# Patient Record
Sex: Female | Born: 1970
Health system: Southern US, Community
[De-identification: ages and names within clinical notes are randomized; demographics above are authoritative.]

## PROBLEM LIST (undated history)

## (undated) DIAGNOSIS — F32A Depression, unspecified: Secondary | ICD-10-CM

## (undated) DIAGNOSIS — R000 Tachycardia, unspecified: Secondary | ICD-10-CM

## (undated) DIAGNOSIS — K219 Gastro-esophageal reflux disease without esophagitis: Secondary | ICD-10-CM

## (undated) DIAGNOSIS — Z8742 Personal history of other diseases of the female genital tract: Secondary | ICD-10-CM

## (undated) DIAGNOSIS — I341 Nonrheumatic mitral (valve) prolapse: Secondary | ICD-10-CM

## (undated) DIAGNOSIS — M1990 Unspecified osteoarthritis, unspecified site: Secondary | ICD-10-CM

## (undated) DIAGNOSIS — F419 Anxiety disorder, unspecified: Secondary | ICD-10-CM

## (undated) DIAGNOSIS — F3281 Premenstrual dysphoric disorder: Secondary | ICD-10-CM

## (undated) DIAGNOSIS — E785 Hyperlipidemia, unspecified: Secondary | ICD-10-CM

## (undated) DIAGNOSIS — R002 Palpitations: Secondary | ICD-10-CM

## (undated) DIAGNOSIS — N12 Tubulo-interstitial nephritis, not specified as acute or chronic: Secondary | ICD-10-CM

## (undated) DIAGNOSIS — F329 Major depressive disorder, single episode, unspecified: Secondary | ICD-10-CM

## (undated) DIAGNOSIS — M51369 Other intervertebral disc degeneration, lumbar region without mention of lumbar back pain or lower extremity pain: Secondary | ICD-10-CM

## (undated) DIAGNOSIS — T7840XA Allergy, unspecified, initial encounter: Secondary | ICD-10-CM

## (undated) DIAGNOSIS — I10 Essential (primary) hypertension: Secondary | ICD-10-CM

## (undated) DIAGNOSIS — E559 Vitamin D deficiency, unspecified: Secondary | ICD-10-CM

## (undated) DIAGNOSIS — N390 Urinary tract infection, site not specified: Secondary | ICD-10-CM

## (undated) DIAGNOSIS — Q433 Congenital malformations of intestinal fixation: Secondary | ICD-10-CM

## (undated) HISTORY — DX: Allergy, unspecified, initial encounter: T78.40XA

## (undated) HISTORY — PX: FOOT SURGERY: SHX648

## (undated) HISTORY — PX: COLPOSCOPY: SHX161

## (undated) HISTORY — DX: Personal history of other diseases of the female genital tract: Z87.42

## (undated) HISTORY — DX: Tubulo-interstitial nephritis, not specified as acute or chronic: N12

## (undated) HISTORY — DX: Hyperlipidemia, unspecified: E78.5

## (undated) HISTORY — PX: HEMORRHOID SURGERY: SHX153

## (undated) HISTORY — DX: Unspecified osteoarthritis, unspecified site: M19.90

## (undated) HISTORY — DX: Major depressive disorder, single episode, unspecified: F32.9

## (undated) HISTORY — DX: Urinary tract infection, site not specified: N39.0

## (undated) HISTORY — DX: Tachycardia, unspecified: R00.0

## (undated) HISTORY — PX: NASAL SINUS SURGERY: SHX719

## (undated) HISTORY — DX: Depression, unspecified: F32.A

## (undated) HISTORY — DX: Anxiety disorder, unspecified: F41.9

## (undated) HISTORY — PX: COLON SURGERY: SHX602

## (undated) HISTORY — DX: Congenital malformations of intestinal fixation: Q43.3

## (undated) HISTORY — PX: TUBAL LIGATION: SHX77

## (undated) HISTORY — DX: Nonrheumatic mitral (valve) prolapse: I34.1

## (undated) HISTORY — DX: Palpitations: R00.2

## (undated) HISTORY — DX: Gastro-esophageal reflux disease without esophagitis: K21.9

## (undated) HISTORY — DX: Essential (primary) hypertension: I10

---

## 1997-09-21 HISTORY — PX: INTESTINAL MALROTATION REPAIR: SHX411

## 1997-09-21 HISTORY — PX: APPENDECTOMY: SHX54

## 2000-09-21 HISTORY — PX: EXCISION MORTON'S NEUROMA: SHX5013

## 2008-09-18 ENCOUNTER — Inpatient Hospital Stay (HOSPITAL_COMMUNITY): Admission: AD | Admit: 2008-09-18 | Discharge: 2008-09-18 | Payer: Self-pay | Admitting: Obstetrics & Gynecology

## 2008-12-26 ENCOUNTER — Ambulatory Visit: Payer: Self-pay | Admitting: Obstetrics & Gynecology

## 2008-12-31 ENCOUNTER — Ambulatory Visit (HOSPITAL_COMMUNITY): Admission: RE | Admit: 2008-12-31 | Discharge: 2008-12-31 | Payer: Self-pay | Admitting: Obstetrics & Gynecology

## 2009-01-09 ENCOUNTER — Encounter: Payer: Self-pay | Admitting: Obstetrics and Gynecology

## 2009-01-09 ENCOUNTER — Ambulatory Visit: Payer: Self-pay | Admitting: Obstetrics & Gynecology

## 2009-01-16 ENCOUNTER — Ambulatory Visit: Payer: Self-pay | Admitting: Obstetrics & Gynecology

## 2009-01-23 ENCOUNTER — Encounter: Payer: Self-pay | Admitting: Obstetrics and Gynecology

## 2009-01-23 ENCOUNTER — Ambulatory Visit: Payer: Self-pay | Admitting: Obstetrics & Gynecology

## 2009-01-23 LAB — CONVERTED CEMR LAB
Chlamydia, DNA Probe: NEGATIVE
GC Probe Amp, Genital: NEGATIVE

## 2009-01-24 ENCOUNTER — Encounter: Payer: Self-pay | Admitting: Obstetrics and Gynecology

## 2009-01-30 ENCOUNTER — Encounter: Payer: Self-pay | Admitting: Physician Assistant

## 2009-01-30 ENCOUNTER — Ambulatory Visit: Payer: Self-pay | Admitting: Obstetrics & Gynecology

## 2009-02-13 ENCOUNTER — Ambulatory Visit: Payer: Self-pay | Admitting: Obstetrics & Gynecology

## 2009-02-18 ENCOUNTER — Inpatient Hospital Stay (HOSPITAL_COMMUNITY): Admission: AD | Admit: 2009-02-18 | Discharge: 2009-02-20 | Payer: Self-pay | Admitting: Obstetrics & Gynecology

## 2009-02-18 ENCOUNTER — Ambulatory Visit: Payer: Self-pay | Admitting: Obstetrics and Gynecology

## 2009-02-24 ENCOUNTER — Inpatient Hospital Stay (HOSPITAL_COMMUNITY): Admission: AD | Admit: 2009-02-24 | Discharge: 2009-02-24 | Payer: Self-pay | Admitting: Obstetrics & Gynecology

## 2009-03-29 ENCOUNTER — Inpatient Hospital Stay (HOSPITAL_COMMUNITY): Admission: AD | Admit: 2009-03-29 | Discharge: 2009-03-29 | Payer: Self-pay | Admitting: Obstetrics & Gynecology

## 2009-03-29 ENCOUNTER — Ambulatory Visit: Payer: Self-pay | Admitting: Obstetrics and Gynecology

## 2009-04-25 ENCOUNTER — Ambulatory Visit: Payer: Self-pay | Admitting: Obstetrics and Gynecology

## 2009-04-26 ENCOUNTER — Encounter: Payer: Self-pay | Admitting: Obstetrics & Gynecology

## 2009-05-02 ENCOUNTER — Ambulatory Visit: Payer: Self-pay | Admitting: Obstetrics & Gynecology

## 2009-05-02 ENCOUNTER — Encounter: Payer: Self-pay | Admitting: Obstetrics & Gynecology

## 2009-12-19 ENCOUNTER — Ambulatory Visit (HOSPITAL_BASED_OUTPATIENT_CLINIC_OR_DEPARTMENT_OTHER): Admission: RE | Admit: 2009-12-19 | Discharge: 2009-12-19 | Payer: Self-pay | Admitting: Surgery

## 2010-05-23 ENCOUNTER — Emergency Department (HOSPITAL_COMMUNITY): Admission: EM | Admit: 2010-05-23 | Discharge: 2010-05-23 | Payer: Self-pay | Admitting: Emergency Medicine

## 2010-06-06 ENCOUNTER — Encounter: Admission: RE | Admit: 2010-06-06 | Discharge: 2010-06-06 | Payer: Self-pay | Admitting: Gastroenterology

## 2010-07-28 ENCOUNTER — Emergency Department (HOSPITAL_COMMUNITY): Admission: EM | Admit: 2010-07-28 | Discharge: 2010-07-28 | Payer: Self-pay | Admitting: Family Medicine

## 2010-09-09 ENCOUNTER — Emergency Department (HOSPITAL_COMMUNITY)
Admission: EM | Admit: 2010-09-09 | Discharge: 2010-09-09 | Payer: Self-pay | Source: Home / Self Care | Admitting: Emergency Medicine

## 2010-12-01 LAB — GC/CHLAMYDIA PROBE AMP, GENITAL
Chlamydia, DNA Probe: NEGATIVE
GC Probe Amp, Genital: NEGATIVE

## 2010-12-01 LAB — POCT URINALYSIS DIPSTICK
Bilirubin Urine: NEGATIVE
Glucose, UA: NEGATIVE mg/dL
Ketones, ur: NEGATIVE mg/dL
Nitrite: NEGATIVE
Protein, ur: NEGATIVE mg/dL
Specific Gravity, Urine: 1.02 (ref 1.005–1.030)
Urobilinogen, UA: 0.2 mg/dL (ref 0.0–1.0)
pH: 7.5 (ref 5.0–8.0)

## 2010-12-01 LAB — POCT PREGNANCY, URINE: Preg Test, Ur: NEGATIVE

## 2010-12-01 LAB — WET PREP, GENITAL
Clue Cells Wet Prep HPF POC: NONE SEEN
Trich, Wet Prep: NONE SEEN
Yeast Wet Prep HPF POC: NONE SEEN

## 2010-12-04 LAB — POCT URINALYSIS DIPSTICK
Bilirubin Urine: NEGATIVE
Glucose, UA: NEGATIVE mg/dL
Nitrite: NEGATIVE
Protein, ur: NEGATIVE mg/dL
Specific Gravity, Urine: 1.025 (ref 1.005–1.030)
Urobilinogen, UA: 0.2 mg/dL (ref 0.0–1.0)
pH: 6 (ref 5.0–8.0)

## 2010-12-04 LAB — POCT PREGNANCY, URINE: Preg Test, Ur: NEGATIVE

## 2010-12-15 LAB — POCT HEMOGLOBIN-HEMACUE: Hemoglobin: 15.1 g/dL — ABNORMAL HIGH (ref 12.0–15.0)

## 2010-12-28 LAB — URINALYSIS, ROUTINE W REFLEX MICROSCOPIC
Bilirubin Urine: NEGATIVE
Glucose, UA: NEGATIVE mg/dL
Ketones, ur: NEGATIVE mg/dL
Nitrite: NEGATIVE
Protein, ur: NEGATIVE mg/dL
Specific Gravity, Urine: 1.025 (ref 1.005–1.030)
Urobilinogen, UA: 0.2 mg/dL (ref 0.0–1.0)
pH: 5.5 (ref 5.0–8.0)

## 2010-12-28 LAB — URINE MICROSCOPIC-ADD ON

## 2010-12-29 LAB — CBC
HCT: 27.6 % — ABNORMAL LOW (ref 36.0–46.0)
Hemoglobin: 9.5 g/dL — ABNORMAL LOW (ref 12.0–15.0)
MCHC: 34.4 g/dL (ref 30.0–36.0)
MCV: 92 fL (ref 78.0–100.0)
Platelets: 197 10*3/uL (ref 150–400)
RBC: 3 MIL/uL — ABNORMAL LOW (ref 3.87–5.11)
RDW: 14.5 % (ref 11.5–15.5)
WBC: 20.8 10*3/uL — ABNORMAL HIGH (ref 4.0–10.5)

## 2010-12-30 LAB — POCT URINALYSIS DIP (DEVICE)
Bilirubin Urine: NEGATIVE
Glucose, UA: NEGATIVE mg/dL
Glucose, UA: NEGATIVE mg/dL
Glucose, UA: NEGATIVE mg/dL
Ketones, ur: NEGATIVE mg/dL
Ketones, ur: NEGATIVE mg/dL
Nitrite: NEGATIVE
Nitrite: NEGATIVE
Nitrite: NEGATIVE
Protein, ur: 100 mg/dL — AB
Protein, ur: 30 mg/dL — AB
Protein, ur: 30 mg/dL — AB
Specific Gravity, Urine: 1.025 (ref 1.005–1.030)
Specific Gravity, Urine: 1.02 (ref 1.005–1.030)
Specific Gravity, Urine: 1.02 (ref 1.005–1.030)
Urobilinogen, UA: 0.2 mg/dL (ref 0.0–1.0)
Urobilinogen, UA: 0.2 mg/dL (ref 0.0–1.0)
Urobilinogen, UA: 0.2 mg/dL (ref 0.0–1.0)
pH: 7 (ref 5.0–8.0)
pH: 7 (ref 5.0–8.0)
pH: 8 (ref 5.0–8.0)

## 2010-12-30 LAB — CBC
HCT: 36.7 % (ref 36.0–46.0)
Hemoglobin: 12.7 g/dL (ref 12.0–15.0)
MCHC: 34.7 g/dL (ref 30.0–36.0)
MCV: 91.2 fL (ref 78.0–100.0)
Platelets: 237 10*3/uL (ref 150–400)
RBC: 4.02 MIL/uL (ref 3.87–5.11)
RDW: 14.4 % (ref 11.5–15.5)
WBC: 12.7 10*3/uL — ABNORMAL HIGH (ref 4.0–10.5)

## 2010-12-30 LAB — RPR: RPR Ser Ql: NONREACTIVE

## 2010-12-31 LAB — POCT URINALYSIS DIP (DEVICE)
Bilirubin Urine: NEGATIVE
Bilirubin Urine: NEGATIVE
Glucose, UA: NEGATIVE mg/dL
Glucose, UA: NEGATIVE mg/dL
Glucose, UA: NEGATIVE mg/dL
Ketones, ur: 80 mg/dL — AB
Ketones, ur: NEGATIVE mg/dL
Ketones, ur: NEGATIVE mg/dL
Nitrite: NEGATIVE
Nitrite: POSITIVE — AB
Nitrite: NEGATIVE
Protein, ur: 100 mg/dL — AB
Protein, ur: NEGATIVE mg/dL
Protein, ur: 30 mg/dL — AB
Specific Gravity, Urine: 1.02 (ref 1.005–1.030)
Specific Gravity, Urine: 1.025 (ref 1.005–1.030)
Specific Gravity, Urine: 1.025 (ref 1.005–1.030)
Urobilinogen, UA: 0.2 mg/dL (ref 0.0–1.0)
Urobilinogen, UA: 0.2 mg/dL (ref 0.0–1.0)
Urobilinogen, UA: 0.2 mg/dL (ref 0.0–1.0)
pH: 6 (ref 5.0–8.0)
pH: 6.5 (ref 5.0–8.0)
pH: 6.5 (ref 5.0–8.0)

## 2011-01-09 ENCOUNTER — Inpatient Hospital Stay (INDEPENDENT_AMBULATORY_CARE_PROVIDER_SITE_OTHER)
Admission: RE | Admit: 2011-01-09 | Discharge: 2011-01-09 | Disposition: A | Discharge status: Home / Self Care | Payer: Medicaid Other | Source: Ambulatory Visit | Attending: Family Medicine | Admitting: Family Medicine

## 2011-01-09 DIAGNOSIS — J019 Acute sinusitis, unspecified: Secondary | ICD-10-CM

## 2011-06-26 LAB — URINE MICROSCOPIC-ADD ON

## 2011-06-26 LAB — GC/CHLAMYDIA PROBE AMP, GENITAL
Chlamydia, DNA Probe: NEGATIVE
GC Probe Amp, Genital: NEGATIVE

## 2011-06-26 LAB — WET PREP, GENITAL
Clue Cells Wet Prep HPF POC: NONE SEEN
Trich, Wet Prep: NONE SEEN
Yeast Wet Prep HPF POC: NONE SEEN

## 2011-06-26 LAB — CBC
HCT: 33.4 % — ABNORMAL LOW (ref 36.0–46.0)
Hemoglobin: 11.6 g/dL — ABNORMAL LOW (ref 12.0–15.0)
MCHC: 34.8 g/dL (ref 30.0–36.0)
MCV: 92.8 fL (ref 78.0–100.0)
Platelets: 197 10*3/uL (ref 150–400)
RBC: 3.6 MIL/uL — ABNORMAL LOW (ref 3.87–5.11)
RDW: 13.3 % (ref 11.5–15.5)
WBC: 11.3 10*3/uL — ABNORMAL HIGH (ref 4.0–10.5)

## 2011-06-26 LAB — URINALYSIS, ROUTINE W REFLEX MICROSCOPIC
Bilirubin Urine: NEGATIVE
Glucose, UA: NEGATIVE mg/dL
Ketones, ur: 15 mg/dL — AB
Leukocytes, UA: NEGATIVE
Nitrite: NEGATIVE
Protein, ur: NEGATIVE mg/dL
Specific Gravity, Urine: >1.03 — ABNORMAL HIGH (ref 1.005–1.030)
Urobilinogen, UA: 0.2 mg/dL (ref 0.0–1.0)
pH: 6 (ref 5.0–8.0)

## 2012-01-22 ENCOUNTER — Encounter (HOSPITAL_COMMUNITY): Payer: Self-pay | Admitting: *Deleted

## 2012-01-22 ENCOUNTER — Emergency Department (HOSPITAL_COMMUNITY)
Admission: EM | Admit: 2012-01-22 | Discharge: 2012-01-22 | Disposition: A | Discharge status: Home / Self Care | Payer: Self-pay | Source: Home / Self Care | Attending: Emergency Medicine | Admitting: Emergency Medicine

## 2012-01-22 DIAGNOSIS — J029 Acute pharyngitis, unspecified: Secondary | ICD-10-CM

## 2012-01-22 LAB — POCT RAPID STREP A: Streptococcus, Group A Screen (Direct): NEGATIVE

## 2012-01-22 MED ORDER — TRAMADOL HCL 50 MG PO TABS: 100.0000 mg | ORAL_TABLET | Freq: Three times a day (TID) | ORAL | Status: AC | PRN

## 2012-01-22 MED ORDER — NAPROXEN 500 MG PO TABS: 500.0000 mg | ORAL_TABLET | Freq: Two times a day (BID) | ORAL | Status: AC

## 2012-01-22 MED ORDER — BENZONATATE 200 MG PO CAPS: 200.0000 mg | ORAL_CAPSULE | Freq: Three times a day (TID) | ORAL | Status: AC | PRN

## 2012-01-22 MED ORDER — PREDNISONE 5 MG PO KIT: 1.0000 | PACK | Freq: Every day | ORAL | Status: DC

## 2012-01-22 NOTE — ED Provider Notes (Signed)
Chief Complaint  Patient presents with  . Sore Throat    History of Present Illness:   The patient is a 41 year old female who has had a 3 or four-day history of severe sore throat, pain on swallowing, pain in her neck, headache, nasal congestion with green rhinorrhea, cough productive of green sputum, and loose stools. She's been exposed to her son who has had the same symptoms. She denies any exposure to strep. She's had no fever, chills, nausea, vomiting, wheezing, or swollen glands.  Review of Systems:  Other than noted above, the patient denies any of the following symptoms. Systemic:  No fever, chills, sweats, fatigue, myalgias, headache, or anorexia. Eye:  No redness, pain or drainage. ENT:  No earache, ear congestion, nasal congestion, sneezing, rhinorrhea, sinus pressure, sinus pain, post nasal drip, or sore throat. Lungs:  No cough, sputum production, wheezing, shortness of breath, or chest pain. GI:  No abdominal pain, nausea, vomiting, or diarrhea. Skin:  No rash or itching.  PMFSH:  Past medical history, family history, social history, meds, and allergies were reviewed.  Physical Exam:   Vital signs:  BP 142/96  Pulse 102  Temp(Src) 99.5 F (37.5 C) (Oral)  Resp 18  SpO2 96%  LMP 01/11/2012 General:  Alert, in no distress. Eye:  No conjunctival injection or drainage. Lids were normal. ENT:  TMs and canals were normal, without erythema or inflammation.  Nasal mucosa was clear and uncongested, without drainage.  Mucous membranes were moist.  Pharynx was mildly erythematous without exudate or drainage.  There were no oral ulcerations or lesions. Neck:  Supple, no adenopathy, tenderness or mass. Lungs:  No respiratory distress.  Lungs were clear to auscultation, without wheezes, rales or rhonchi.  Breath sounds were clear and equal bilaterally. Lungs were resonant to percussion.  No egophony. Heart:  Regular rhythm, without gallops, murmers or rubs. Skin:  Clear, warm, and  dry, without rash or lesions.  Labs:   Results for orders placed during the hospital encounter of 01/22/12  POCT RAPID STREP A ([facility] ONLY)      Component Value Range   Streptococcus, Group A Screen (Direct) NEGATIVE  NEGATIVE     Radiology:  No results found.  Assessment:  The encounter diagnosis was Viral pharyngitis.  Plan:   1.  The following meds were prescribed:   New Prescriptions   BENZONATATE (TESSALON) 200 MG CAPSULE    Take 1 capsule (200 mg total) by mouth 3 (three) times daily as needed for cough.   NAPROXEN (NAPROSYN) 500 MG TABLET    Take 1 tablet (500 mg total) by mouth 2 (two) times daily.   PREDNISONE 5 MG KIT    Take 1 kit (5 mg total) by mouth daily after breakfast. Prednisone 5 mg 6 day dosepack.  Take as directed.   TRAMADOL (ULTRAM) 50 MG TABLET    Take 2 tablets (100 mg total) by mouth every 8 (eight) hours as needed for pain.   2.  The patient was instructed in symptomatic care and handouts were given. 3.  The patient was told to return if becoming worse in any way, if no better in 3 or 4 days, and given some red flag symptoms that would indicate earlier return.   Reuben Likes, MD 01/22/12 [phone]

## 2012-01-22 NOTE — ED Notes (Signed)
Pt  Has  Reports  Symptoms   Of  Fever  sorethroat  abd  Pains    As  Well  As      Body  Aches    X   3  Days  She  Reports  She  Has  Had  A  Low grade  Fever     -  She is  Masked  And  Is  In a  Private  Room        She  Is  Sitting  Upright on  Exam table speaking in  Complete  sentances

## 2012-01-22 NOTE — Discharge Instructions (Signed)

## 2012-11-28 ENCOUNTER — Ambulatory Visit: Payer: Medicaid Other | Attending: Sports Medicine | Admitting: Physical Therapy

## 2012-11-28 DIAGNOSIS — R5381 Other malaise: Secondary | ICD-10-CM | POA: Insufficient documentation

## 2012-11-28 DIAGNOSIS — M545 Low back pain, unspecified: Secondary | ICD-10-CM | POA: Insufficient documentation

## 2012-11-28 DIAGNOSIS — IMO0001 Reserved for inherently not codable concepts without codable children: Secondary | ICD-10-CM | POA: Insufficient documentation

## 2012-11-28 DIAGNOSIS — M2560 Stiffness of unspecified joint, not elsewhere classified: Secondary | ICD-10-CM | POA: Insufficient documentation

## 2012-12-05 ENCOUNTER — Ambulatory Visit: Payer: Medicaid Other | Admitting: Rehabilitation

## 2012-12-12 ENCOUNTER — Ambulatory Visit: Payer: Medicaid Other | Admitting: Rehabilitation

## 2012-12-19 ENCOUNTER — Ambulatory Visit: Payer: Medicaid Other | Admitting: Rehabilitation

## 2012-12-26 ENCOUNTER — Ambulatory Visit: Payer: Medicaid Other | Attending: Sports Medicine | Admitting: Rehabilitation

## 2012-12-26 DIAGNOSIS — IMO0001 Reserved for inherently not codable concepts without codable children: Secondary | ICD-10-CM | POA: Insufficient documentation

## 2012-12-26 DIAGNOSIS — R5381 Other malaise: Secondary | ICD-10-CM | POA: Insufficient documentation

## 2012-12-26 DIAGNOSIS — M545 Low back pain, unspecified: Secondary | ICD-10-CM | POA: Insufficient documentation

## 2012-12-26 DIAGNOSIS — M2560 Stiffness of unspecified joint, not elsewhere classified: Secondary | ICD-10-CM | POA: Insufficient documentation

## 2013-05-22 DIAGNOSIS — I10 Essential (primary) hypertension: Secondary | ICD-10-CM

## 2013-05-22 HISTORY — DX: Essential (primary) hypertension: I10

## 2013-05-31 ENCOUNTER — Encounter (HOSPITAL_COMMUNITY): Payer: Self-pay | Admitting: Emergency Medicine

## 2013-05-31 ENCOUNTER — Emergency Department (HOSPITAL_COMMUNITY)
Admission: EM | Admit: 2013-05-31 | Discharge: 2013-05-31 | Disposition: A | Discharge status: Home / Self Care | Payer: Medicaid Other | Source: Home / Self Care

## 2013-05-31 DIAGNOSIS — J309 Allergic rhinitis, unspecified: Secondary | ICD-10-CM

## 2013-05-31 MED ORDER — PREDNISONE 20 MG PO TABS: ORAL_TABLET | ORAL | Status: DC

## 2013-05-31 MED ORDER — TRIAMCINOLONE ACETONIDE 40 MG/ML IJ SUSP
INTRAMUSCULAR | Status: AC
  Filled 2013-05-31: qty 1

## 2013-05-31 MED ORDER — TRIAMCINOLONE ACETONIDE 40 MG/ML IJ SUSP
40.0000 mg | Freq: Once | INTRAMUSCULAR | Status: AC
  Administered 2013-05-31: 40 mg via INTRAMUSCULAR

## 2013-05-31 NOTE — ED Provider Notes (Signed)
CSN: [id number]     Arrival date & time 05/31/13  1701 History   First MD Initiated Contact with Patient 05/31/13 1826     Chief Complaint  Patient presents with  . URI   (Consider location/radiation/quality/duration/timing/severity/associated sxs/prior Treatment) HPI Comments: 42 year old female complaining of cough, runny nose, sneezing, sinus congestion this started September 1. She states that a lot of the runny nose, cough and PND have ameliorated however now she is complaining of nasal stuffiness and inability to breathe through the nose. She feels just stopped up. Complaining of pain in the maxillary sinuses, movement of the eyes and headache. Denies fever, chills or GI symptoms. She has a history of allergies for several years and it was made worse after moving to [place] 4 years ago.   History reviewed. No pertinent past medical history. Past Surgical History  Procedure Laterality Date  . Intestinal malrotation repair    . Foot surgery     History reviewed. No pertinent family history. History  Substance Use Topics  . Smoking status: Current Every Day Smoker  . Smokeless tobacco: Not on file  . Alcohol Use: Yes   OB History   Grav Para Term Preterm Abortions TAB SAB Ect Mult Living                 Review of Systems  Constitutional: Negative for fever, chills and activity change.  HENT: Positive for congestion, rhinorrhea and sinus pressure. Negative for hearing loss, ear pain, sore throat, neck pain, neck stiffness and ear discharge.   Respiratory: Negative for cough, shortness of breath and wheezing.   Cardiovascular: Negative.   Gastrointestinal: Negative.   Genitourinary: Negative.   Skin: Negative.   Neurological: Negative.     Allergies  Imitrex; Penicillins; Prozac; Sulfa antibiotics; and Zoloft  Home Medications   Current Outpatient Rx  Name  Route  Sig  Dispense  Refill  . predniSONE (DELTASONE) 20 MG tablet      3 tabs q d for 2 d, 2 tabs q d  for 4 d, 1 tab q d for 4 days. Take with food   18 tablet   0   . PredniSONE 5 MG KIT   Oral   Take 1 kit (5 mg total) by mouth daily after breakfast. Prednisone 5 mg 6 day dosepack.  Take as directed.   1 kit   0    BP 121/84  Pulse 76  Temp(Src) 97.7 F (36.5 C) (Oral)  Resp 16  SpO2 99%  LMP 05/11/2013 Physical Exam  Nursing note and vitals reviewed. Constitutional: She is oriented to person, place, and time. She appears well-developed and well-nourished. No distress.  HENT:  Right Ear: External ear normal.  Left Ear: External ear normal.  Mouth/Throat: Oropharynx is clear and moist. No oropharyngeal exudate.  Bilateral TMs are normal  Eyes: Conjunctivae and EOM are normal.  Neck: Normal range of motion. Neck supple.  Cardiovascular: Normal rate, regular rhythm and normal heart sounds.   Pulmonary/Chest: Effort normal and breath sounds normal. No respiratory distress. She has no wheezes. She has no rales.  Musculoskeletal: Normal range of motion. She exhibits no edema.  Lymphadenopathy:    She has no cervical adenopathy.  Neurological: She is alert and oriented to person, place, and time.  Skin: Skin is warm and dry. No rash noted.  Psychiatric: She has a normal mood and affect.    ED Course  Procedures (including critical care time) Labs Review Labs Reviewed - No  data to display Imaging Review No results found.  MDM   1. Allergic rhinitis   2. Allergic sinusitis      Kenalog 40 mg IM Starting tomorrow prednisone as directed Use Sudafed PE 10 mg every 4 hours when necessary congestion and Continue using the Netty pot and or nasal saline copiously frequently Continue the Zyrtec but may switch to Claritin or Allegra when necessary Guaifenesin as discussed Plenty of fluids    Hayden Rasmussen, NP 05/31/13 1918

## 2013-05-31 NOTE — ED Notes (Signed)
C/o cold symptoms for 11 days now.  OTC medication taken.  Patient states symptoms are dry cough, congested, headache, sweating, fever on and off and throat is hoarse.

## 2013-05-31 NOTE — ED Provider Notes (Signed)
Medical screening examination/treatment/procedure(s) were performed by resident physician or non-physician practitioner and as supervising physician I was immediately available for consultation/collaboration.   KINDL,JAMES DOUGLAS MD.   James D Kindl, MD 05/31/13 2023 

## 2013-06-04 ENCOUNTER — Telehealth (HOSPITAL_COMMUNITY): Payer: Self-pay | Admitting: Emergency Medicine

## 2013-06-04 NOTE — ED Notes (Signed)
Patient called stating she has followed on instructions but has not shown any improvement.  Chart reviewed by Hayden Rasmussen NP.  He advised patient to follow up with PCP.  Patient does not have a PCP.  Patient advised to try Allegra D or Claritin

## 2013-06-05 NOTE — ED Notes (Signed)
Chart review.

## 2013-06-09 ENCOUNTER — Ambulatory Visit: Payer: Medicaid Other | Attending: Family Medicine | Admitting: Internal Medicine

## 2013-06-09 VITALS — BP 167/93 | HR 90 | Temp 99.1°F | Resp 16 | Wt 168.0 lb

## 2013-06-09 DIAGNOSIS — Z0000 Encounter for general adult medical examination without abnormal findings: Secondary | ICD-10-CM | POA: Insufficient documentation

## 2013-06-09 DIAGNOSIS — R300 Dysuria: Secondary | ICD-10-CM | POA: Insufficient documentation

## 2013-06-09 DIAGNOSIS — J209 Acute bronchitis, unspecified: Secondary | ICD-10-CM | POA: Insufficient documentation

## 2013-06-09 LAB — CBC WITH DIFFERENTIAL/PLATELET
Basophils Absolute: 0 10*3/uL (ref 0.0–0.1)
Basophils Relative: 0 % (ref 0–1)
Eosinophils Absolute: 0.2 10*3/uL (ref 0.0–0.7)
Eosinophils Relative: 2 % (ref 0–5)
HCT: 43 % (ref 36.0–46.0)
Hemoglobin: 14.9 g/dL (ref 12.0–15.0)
Lymphocytes Relative: 19 % (ref 12–46)
Lymphs Abs: 2.3 10*3/uL (ref 0.7–4.0)
MCH: 29.4 pg (ref 26.0–34.0)
MCHC: 34.7 g/dL (ref 30.0–36.0)
MCV: 85 fL (ref 78.0–100.0)
Monocytes Absolute: 0.9 10*3/uL (ref 0.1–1.0)
Monocytes Relative: 7 % (ref 3–12)
Neutro Abs: 8.4 10*3/uL — ABNORMAL HIGH (ref 1.7–7.7)
Neutrophils Relative %: 72 % (ref 43–77)
Platelets: 367 10*3/uL (ref 150–400)
RBC: 5.06 MIL/uL (ref 3.87–5.11)
RDW: 14 % (ref 11.5–15.5)
WBC: 11.8 10*3/uL — ABNORMAL HIGH (ref 4.0–10.5)

## 2013-06-09 MED ORDER — BENZONATATE 100 MG PO CAPS: 100.0000 mg | ORAL_CAPSULE | Freq: Three times a day (TID) | ORAL | Status: DC

## 2013-06-09 MED ORDER — LEVOFLOXACIN 500 MG PO TABS: 500.0000 mg | ORAL_TABLET | Freq: Every day | ORAL | Status: DC

## 2013-06-09 MED ORDER — GUAIFENESIN-DM 100-10 MG/5ML PO SYRP: 5.0000 mL | ORAL_SOLUTION | Freq: Three times a day (TID) | ORAL | Status: DC | PRN

## 2013-06-09 NOTE — Patient Instructions (Signed)

## 2013-06-09 NOTE — Progress Notes (Signed)
Patient complains of not feeling well since labor day Cough Congestion Can not smell or taste anything

## 2013-06-09 NOTE — Progress Notes (Signed)
Patient ID: Victoria Barr, female   DOB: 16-Jan-1971, 42 y.o.   MRN: [id number] Patient Demographics  Victoria Barr, is a 42 y.o. female  WUJ:[id number]  [id number]  DOB - Feb 16, 1971  Chief Complaint  Patient presents with  . Cough        Subjective:   Victoria Barr today is here to establish primary care.  Patient is a 42 year old female with no significant past medical history, currently in school for medical assistant program, having significant stress having acute bronchitis, coughing with greenish phlegm, low-grade fever, has not felt good in the last 2 weeks. Also has some lower abdominal pain with pain with urination Patient has No headache, No chest pain, No abdominal pain - No Nausea, No new weakness tingling or numbness, No Cough - SOB.   Objective:    Filed Vitals:   06/09/13 0905  BP: 167/93  Pulse: 90  Temp: 99.1 F (37.3 C)  Resp: 16  Weight: 168 lb (76.204 kg)  SpO2: 100%     ALLERGIES:   Allergies  Allergen Reactions  . Imitrex [Sumatriptan]   . Penicillins   . Prozac [Fluoxetine Hcl]   . Sulfa Antibiotics   . Zoloft [Sertraline Hcl]     PAST MEDICAL HISTORY: History reviewed. No pertinent past medical history.  PAST SURGICAL HISTORY: Past Surgical History  Procedure Laterality Date  . Intestinal malrotation repair    . Foot surgery      FAMILY HISTORY: History reviewed. No pertinent family history.  MEDICATIONS AT HOME: Prior to Admission medications   Medication Sig Start Date End Date Taking? Authorizing Provider  benzonatate (TESSALON) 100 MG capsule Take 1 capsule (100 mg total) by mouth 3 (three) times daily. 06/09/13   Ripudeep Jenna Luo, MD  guaiFENesin-dextromethorphan (ROBITUSSIN DM) 100-10 MG/5ML syrup Take 5 mLs by mouth 3 (three) times daily as needed for cough. 06/09/13   Ripudeep Jenna Luo, MD  levofloxacin (LEVAQUIN) 500 MG tablet Take 1 tablet (500 mg total) by mouth daily. 06/09/13   Ripudeep Jenna Luo, MD  predniSONE  (DELTASONE) 20 MG tablet 3 tabs q d for 2 d, 2 tabs q d for 4 d, 1 tab q d for 4 days. Take with food 05/31/13   Hayden Rasmussen, NP  PredniSONE 5 MG KIT Take 1 kit (5 mg total) by mouth daily after breakfast. Prednisone 5 mg 6 day dosepack.  Take as directed. 01/22/12   Reuben Likes, MD    REVIEW OF SYSTEMS:  Constitutional:   No   Fevers, chills, fatigue.  HEENT:    No headaches, Sore throat,   Cardio-vascular: No chest pain,  Orthopnea, swelling in lower extremities, anasarca, palpitations  GI:  No abdominal pain, nausea, vomiting, diarrhea  Resp: Please see history of present illness  Skin:  no rash or lesions.  GU:  no dysuria, change in color of urine, no urgency or frequency.  No flank pain.  Musculoskeletal: No joint pain or swelling.  No decreased range of motion.  No back pain.  Psych: No change in mood or affect. No depression or anxiety.  No memory loss.   Exam  General appearance :Awake, alert, NAD, Speech Clear. HEENT: Atraumatic and Normocephalic, PERLA Neck: supple, no JVD. No cervical lymphadenopathy.  Chest: clear to auscultation bilaterally, no wheezing, rales or rhonchi CVS: S1 S2 regular, no murmurs.  Abdomen: soft, NBS, NT, ND, no gaurding, rigidity or rebound. Extremities: No cyanosis, clubbing, B/L Lower Ext shows no edema,  Neurology: Awake alert, and oriented  X 3, CN II-XII intact, Non focal Skin:No Rash or lesions Wounds: N/A    Data Review   Basic Metabolic Panel: No results found for this basename: NA, K, CL, CO2, GLUCOSE, BUN, CREATININE, CALCIUM, MG, PHOS,  in the last 168 hours Liver Function Tests: No results found for this basename: AST, ALT, ALKPHOS, BILITOT, PROT, ALBUMIN,  in the last 168 hours  CBC: No results found for this basename: WBC, NEUTROABS, HGB, HCT, MCV, PLT,  in the last 168 hours ------------------------------------------------------------------------------------------------------------------ No results found for  this basename: HGBA1C,  in the last 72 hours ------------------------------------------------------------------------------------------------------------------ No results found for this basename: CHOL, HDL, LDLCALC, TRIG, CHOLHDL, LDLDIRECT,  in the last 72 hours ------------------------------------------------------------------------------------------------------------------ No results found for this basename: TSH, T4TOTAL, FREET3, T3FREE, THYROIDAB,  in the last 72 hours ------------------------------------------------------------------------------------------------------------------ No results found for this basename: VITAMINB12, FOLATE, FERRITIN, TIBC, IRON, RETICCTPCT,  in the last 72 hours  Coagulation profile  No results found for this basename: INR, PROTIME,  in the last 168 hours    Assessment & Plan   Active Problems: Acute bronchitis with low-grade fevers and productive phlegm - Patient have symptoms of early pneumonia, however she does not want chest x-ray. - Will place on Levaquin 500 milligram daily for 7 days, Tessalon Perles, Robitussin - Flu shot at the next visit  Elevated BP reading: Patient is undergoing stress due to school and family issues at home  - No need of any antihypertensives unless she has repeated elevated BP readings  Health screening - Check CBC to rule out leukocytosis, UA to rule out UTI - Flu shot next visit - Patient states that she is up-to-date with her Pap smears, last was in 2013 and is not due for one.  Follow-up in one month, patient advised to call earlier if symptoms are not improving   RAI,RIPUDEEP M.D. 06/09/2013, 9:32 AM

## 2013-06-10 ENCOUNTER — Emergency Department (HOSPITAL_COMMUNITY): Payer: Medicaid Other

## 2013-06-10 ENCOUNTER — Encounter (HOSPITAL_COMMUNITY): Payer: Self-pay | Admitting: *Deleted

## 2013-06-10 ENCOUNTER — Emergency Department (HOSPITAL_COMMUNITY)
Admission: EM | Admit: 2013-06-10 | Discharge: 2013-06-10 | Disposition: A | Discharge status: Home / Self Care | Payer: Medicaid Other | Attending: Emergency Medicine | Admitting: Emergency Medicine

## 2013-06-10 DIAGNOSIS — Z79899 Other long term (current) drug therapy: Secondary | ICD-10-CM | POA: Insufficient documentation

## 2013-06-10 DIAGNOSIS — J209 Acute bronchitis, unspecified: Secondary | ICD-10-CM

## 2013-06-10 DIAGNOSIS — J069 Acute upper respiratory infection, unspecified: Secondary | ICD-10-CM

## 2013-06-10 DIAGNOSIS — Z87891 Personal history of nicotine dependence: Secondary | ICD-10-CM | POA: Insufficient documentation

## 2013-06-10 DIAGNOSIS — Z880 Allergy status to penicillin: Secondary | ICD-10-CM | POA: Insufficient documentation

## 2013-06-10 LAB — BASIC METABOLIC PANEL
BUN: 19 mg/dL (ref 6–23)
CO2: 26 mEq/L (ref 19–32)
Calcium: 9.6 mg/dL (ref 8.4–10.5)
Chloride: 98 mEq/L (ref 96–112)
Creatinine, Ser: 0.9 mg/dL (ref 0.50–1.10)
GFR calc Af Amer: >90 mL/min (ref >90)
GFR calc non Af Amer: 80 mL/min — ABNORMAL LOW (ref >90)
Glucose, Bld: 99 mg/dL (ref 70–99)
Potassium: 4.6 mEq/L (ref 3.5–5.1)
Sodium: 135 mEq/L (ref 135–145)

## 2013-06-10 LAB — CBC
HCT: 43.9 % (ref 36.0–46.0)
Hemoglobin: 15.5 g/dL — ABNORMAL HIGH (ref 12.0–15.0)
MCH: 30.4 pg (ref 26.0–34.0)
MCHC: 35.3 g/dL (ref 30.0–36.0)
MCV: 86.1 fL (ref 78.0–100.0)
Platelets: 322 10*3/uL (ref 150–400)
RBC: 5.1 MIL/uL (ref 3.87–5.11)
RDW: 13.1 % (ref 11.5–15.5)
WBC: 13.8 10*3/uL — ABNORMAL HIGH (ref 4.0–10.5)

## 2013-06-10 LAB — POCT I-STAT TROPONIN I: Troponin i, poc: 0 ng/mL (ref 0.00–0.08)

## 2013-06-10 MED ORDER — PREDNISONE 50 MG PO TABS: 50.0000 mg | ORAL_TABLET | Freq: Every day | ORAL | Status: DC

## 2013-06-10 MED ORDER — ALBUTEROL SULFATE (5 MG/ML) 0.5% IN NEBU
5.0000 mg | INHALATION_SOLUTION | Freq: Once | RESPIRATORY_TRACT | Status: AC
  Administered 2013-06-10: 5 mg via RESPIRATORY_TRACT
  Filled 2013-06-10: qty 1

## 2013-06-10 MED ORDER — SODIUM CHLORIDE 0.9 % IV BOLUS (SEPSIS)
1000.0000 mL | Freq: Once | INTRAVENOUS | Status: AC
  Administered 2013-06-10: 1000 mL via INTRAVENOUS

## 2013-06-10 MED ORDER — GUAIFENESIN ER 1200 MG PO TB12: 1.0000 | ORAL_TABLET | Freq: Two times a day (BID) | ORAL | Status: DC

## 2013-06-10 MED ORDER — ALBUTEROL SULFATE HFA 108 (90 BASE) MCG/ACT IN AERS
2.0000 | INHALATION_SPRAY | RESPIRATORY_TRACT | Status: DC | PRN
  Administered 2013-06-10 (×2): 2 via RESPIRATORY_TRACT
  Filled 2013-06-10 (×2): qty 6.7

## 2013-06-10 MED ORDER — PROMETHAZINE-DM 6.25-15 MG/5ML PO SYRP: 5.0000 mL | ORAL_SOLUTION | Freq: Four times a day (QID) | ORAL | Status: DC | PRN

## 2013-06-10 MED ORDER — KETOROLAC TROMETHAMINE 30 MG/ML IJ SOLN
30.0000 mg | Freq: Once | INTRAMUSCULAR | Status: AC
  Administered 2013-06-10: 30 mg via INTRAVENOUS
  Filled 2013-06-10: qty 1

## 2013-06-10 NOTE — ED Notes (Signed)
Pt states she was dx yesterday with pneumonia.  Took abx yesterday and today.  Still feels short of breath, pain on inspiration, and pain when coughing.  Also states BP was high yesterday at clinic and high here at triage, has HA.

## 2013-06-10 NOTE — ED Notes (Signed)
Pt states recent diagnosis of pneumonia and now here with chest pain like someone is squeezing her heart.  SHORTNESS OF Breath.  Pt reports back hurts so bad from coughing that it hurts to walk

## 2013-06-10 NOTE — ED Provider Notes (Signed)
CSN: [id number]     Arrival date & time 06/10/13  1349 History   First MD Initiated Contact with Patient 06/10/13 1553     Chief Complaint  Patient presents with  . Chest Pain   (Consider location/radiation/quality/duration/timing/severity/associated sxs/prior Treatment) HPI Patient presents to the emergency department with cough and left-sided chest pain.  Patient, states, that she was seen in urgent care for her symptoms 2 weeks ago and diagnosed with allergic type symptoms.  Patient, states, that her symptoms have continued and she developed left-sided lateral chest pain.  Patient, states [facility] and told she had pneumonia but no chest x-ray was obtained.  Patient denies nausea, vomiting, diarrhea, abdominal pain, back pain, dysuria, numbness, weakness, dizziness, blurred vision, or syncope.  Patient, states, that she did not take any other medications other than ones prescribed. History reviewed. No pertinent past medical history. Past Surgical History  Procedure Laterality Date  . Intestinal malrotation repair    . Foot surgery    . Tubal ligation     No family history on file. History  Substance Use Topics  . Smoking status: Former Games developer  . Smokeless tobacco: Not on file     Comment: quit 1 year ago, sep 2013  . Alcohol Use: Yes     Comment: occasionally   OB History   Grav Para Term Preterm Abortions TAB SAB Ect Mult Living                 Review of Systems All other systems negative except as documented in the HPI. All pertinent positives and negatives as reviewed in the HPI. Allergies  Imitrex; Penicillins; Prozac; Sulfa antibiotics; and Zoloft  Home Medications   Current Outpatient Rx  Name  Route  Sig  Dispense  Refill  . benzonatate (TESSALON) 100 MG capsule   Oral   Take 1 capsule (100 mg total) by mouth 3 (three) times daily.   30 capsule   0   . Cyanocobalamin (VITAMIN B-12 PO)   Oral   Take 1 tablet by mouth  daily.         Marland Kitchen guaiFENesin-dextromethorphan (ROBITUSSIN DM) 100-10 MG/5ML syrup   Oral   Take 5 mLs by mouth 3 (three) times daily as needed for cough.   118 mL   0   . levofloxacin (LEVAQUIN) 500 MG tablet   Oral   Take 1 tablet (500 mg total) by mouth daily.   7 tablet   0   . naproxen (NAPROSYN) 500 MG tablet   Oral   Take 500 mg by mouth daily as needed (for back pain).         Marland Kitchen omega-3 acid ethyl esters (LOVAZA) 1 G capsule   Oral   Take 1 g by mouth daily.          BP 111/69  Pulse 73  Temp(Src) 98.2 F (36.8 C) (Oral)  Resp 20  SpO2 100%  LMP 05/11/2013 Physical Exam  Nursing note and vitals reviewed. Constitutional: She appears well-developed and well-nourished. No distress.  HENT:  Head: Normocephalic and atraumatic.  Mouth/Throat: Oropharynx is clear and moist.  Eyes: Conjunctivae are normal. Pupils are equal, round, and reactive to light.  Neck: Normal range of motion. Neck supple.  Cardiovascular: Normal rate, regular rhythm and normal heart sounds.  Exam reveals no gallop and no friction rub.   No murmur heard. Pulmonary/Chest: Effort normal and breath sounds normal. No respiratory distress. She has no  wheezes. She has no rales. She exhibits tenderness.  Skin: Skin is warm and dry. No rash noted.    ED Course  Procedures (including critical care time) Labs Review Labs Reviewed  CBC - Abnormal; Notable for the following:    WBC 13.8 (*)    Hemoglobin 15.5 (*)    All other components within normal limits  BASIC METABOLIC PANEL - Abnormal; Notable for the following:    GFR calc non Af Amer 80 (*)    All other components within normal limits  POCT I-STAT TROPONIN I   Imaging Review Dg Chest 2 View  06/10/2013   *RADIOLOGY REPORT*  Clinical Data: Pain  CHEST - 2 VIEW  Comparison: None.  Findings: There is no focal infiltrate, pulmonary edema, or pleural effusion.  The mediastinal contour and cardiac silhouette are normal.  There is curvature  of spine to the left.  IMPRESSION: No acute cardiopulmonary disease identified.   Original Report Authenticated By: Sherian Rein, M.D.   patient most likely has viral URI versus bronchitis.  Patient's wheezing.  On my exam, bronchitis, seems unlikely.  Patient does not have a sense of pneumonia on chest x-ray, however, she was placed on antibiotics for presumed pneumonia by the [facility].  Patient will be given further treatment for a viral type illness patient's vital signs been stable here in the emergency department  MDM  MDM Reviewed: vitals, nursing note and previous chart Interpretation: labs and x-ray        Carlyle Dolly, PA-C 06/10/13 1754

## 2013-06-10 NOTE — ED Provider Notes (Signed)
Medical screening examination/treatment/procedure(s) were performed by non-physician practitioner and as supervising physician I was immediately available for consultation/collaboration.   Charles B. Bernette Mayers, MD 06/10/13 1757

## 2013-07-10 ENCOUNTER — Ambulatory Visit: Payer: Self-pay | Admitting: Internal Medicine

## 2013-10-16 ENCOUNTER — Other Ambulatory Visit (HOSPITAL_COMMUNITY): Payer: Self-pay | Admitting: Sports Medicine

## 2013-10-16 DIAGNOSIS — M25552 Pain in left hip: Secondary | ICD-10-CM

## 2013-10-26 ENCOUNTER — Ambulatory Visit (HOSPITAL_COMMUNITY): Admission: RE | Admit: 2013-10-26 | Payer: Medicaid Other | Source: Ambulatory Visit

## 2013-10-27 ENCOUNTER — Ambulatory Visit (HOSPITAL_COMMUNITY): Payer: Medicaid Other

## 2013-11-01 ENCOUNTER — Ambulatory Visit (HOSPITAL_COMMUNITY)
Admission: RE | Admit: 2013-11-01 | Discharge: 2013-11-01 | Disposition: A | Discharge status: Home / Self Care | Payer: Medicaid Other | Source: Ambulatory Visit | Attending: Sports Medicine | Admitting: Sports Medicine

## 2013-11-01 DIAGNOSIS — M79609 Pain in unspecified limb: Secondary | ICD-10-CM | POA: Insufficient documentation

## 2013-11-01 DIAGNOSIS — M25552 Pain in left hip: Secondary | ICD-10-CM

## 2013-11-01 DIAGNOSIS — M5126 Other intervertebral disc displacement, lumbar region: Secondary | ICD-10-CM | POA: Insufficient documentation

## 2013-11-30 ENCOUNTER — Ambulatory Visit (INDEPENDENT_AMBULATORY_CARE_PROVIDER_SITE_OTHER): Payer: 59 | Admitting: Cardiology

## 2013-11-30 ENCOUNTER — Encounter: Payer: Self-pay | Admitting: Cardiology

## 2013-11-30 ENCOUNTER — Other Ambulatory Visit: Payer: Self-pay | Admitting: Cardiology

## 2013-11-30 VITALS — BP 110/80 | HR 88 | Ht 68.0 in

## 2013-11-30 DIAGNOSIS — I341 Nonrheumatic mitral (valve) prolapse: Secondary | ICD-10-CM | POA: Insufficient documentation

## 2013-11-30 DIAGNOSIS — R000 Tachycardia, unspecified: Secondary | ICD-10-CM

## 2013-11-30 DIAGNOSIS — R9431 Abnormal electrocardiogram [ECG] [EKG]: Secondary | ICD-10-CM

## 2013-11-30 DIAGNOSIS — I498 Other specified cardiac arrhythmias: Secondary | ICD-10-CM

## 2013-11-30 DIAGNOSIS — R079 Chest pain, unspecified: Secondary | ICD-10-CM

## 2013-11-30 DIAGNOSIS — R002 Palpitations: Secondary | ICD-10-CM

## 2013-11-30 DIAGNOSIS — I059 Rheumatic mitral valve disease, unspecified: Secondary | ICD-10-CM

## 2013-11-30 LAB — CBC
HCT: 41 % (ref 36.0–46.0)
Hemoglobin: 14.4 g/dL (ref 12.0–15.0)
MCH: 30.1 pg (ref 26.0–34.0)
MCHC: 35.1 g/dL (ref 30.0–36.0)
MCV: 85.6 fL (ref 78.0–100.0)
Platelets: 314 10*3/uL (ref 150–400)
RBC: 4.79 MIL/uL (ref 3.87–5.11)
RDW: 12.9 % (ref 11.5–15.5)
WBC: 6.4 10*3/uL (ref 4.0–10.5)

## 2013-11-30 LAB — D-DIMER, QUANTITATIVE (NOT AT ARMC): D-Dimer, Quant: 0.45 ug/mL-FEU (ref 0.00–0.48)

## 2013-11-30 LAB — TROPONIN I: Troponin I: <0.3 ng/mL (ref <0.30)

## 2013-11-30 LAB — BASIC METABOLIC PANEL
BUN: 16 mg/dL (ref 6–23)
CO2: 27 mEq/L (ref 19–32)
Calcium: 10 mg/dL (ref 8.4–10.5)
Chloride: 95 mEq/L — ABNORMAL LOW (ref 96–112)
Creat: 0.8 mg/dL (ref 0.50–1.10)
Glucose, Bld: 103 mg/dL — ABNORMAL HIGH (ref 70–99)
Potassium: 4 mEq/L (ref 3.5–5.3)
Sodium: 138 mEq/L (ref 135–145)

## 2013-11-30 LAB — CK TOTAL AND CKMB (NOT AT ARMC)
CK, MB: 1.2 ng/mL (ref 0.3–4.0)
Total CK: 85 U/L (ref 7–177)

## 2013-11-30 LAB — TSH: TSH: 1.188 u[IU]/mL (ref 0.350–4.500)

## 2013-11-30 MED ORDER — METOPROLOL SUCCINATE ER 25 MG PO TB24: 25.0000 mg | ORAL_TABLET | Freq: Every day | ORAL | Status: DC

## 2013-11-30 NOTE — Patient Instructions (Addendum)
Your physician recommends that you return for lab work today for TSH, D-Dimer, Troponin, CkMB, CBC and Bmet.  Your physician has requested that you have en exercise stress myoview. For further information please visit [email or web address]. Please follow instruction sheet, as given.  Your physician has requested that you have an echocardiogram. Echocardiography is a painless test that uses sound waves to create images of your heart. It provides your doctor with information about the size and shape of your heart and how well your heart's chambers and valves are working. This procedure takes approximately one hour. There are no restrictions for this procedure.  Your physician recommends that you schedule a follow-up appointment in: 2 weeks with Dr. Mayford Knifeurner.    Your physician has recommended you make the following change in your medication:   1. Start Toprol Xl 25 mg daily.

## 2013-11-30 NOTE — Progress Notes (Signed)
[address] [place], [place]  [address] Phone: [phone] Fax:  [phone]  Date:  11/30/2013   ID:  Victoria Barr, DOB August 25, 1971, MRN [id number]  PCP:  No PCP Per Patient  Cardiologist:  Armanda Magic, MD     History of Present Illness: Victoria Barr is a 43 y.o. female with a history of MV prolapse diagnosed as a teenager but echo in 2012 with no MVP but flat coaptation of the AMVL with mild eccentric MR who presents today for evaluation of palpitations.  She has a history of palpitations in the past and was placed on Toprol but eventually stopped.  She says that for about a week she has noticed palpitations.  She drinks 2 cups of coffee daily and no soda.  She has been dizzy but not presyncopal ad has a twinge of pain under her left breast.  Her mother died of hypertensive heart disease.  The CP is nonexertional but can come on some with exertion.  The CP only lasts a few seconds and only happens when her heart is racing.  She feels her heart race and then she feels she has to breath faster.  She has had some LE edema and her HCTZ was increased 2 weeks ago.  She has not travelled anywhere recently.     Wt Readings from Last 3 Encounters:  06/09/13 168 lb (76.204 kg)     Past Medical History  Diagnosis Date  . Hypertension   . Palpitations   . MVP (mitral valve prolapse)     echo in 2012 did not show MVP but did have mild elongation of the AMVL    Current Outpatient Prescriptions  Medication Sig Dispense Refill  . BIOTIN PO Take by mouth daily.      . Cholecalciferol (VITAMIN D PO) Take by mouth daily.      Marland Kitchen dicyclomine (BENTYL) 20 MG tablet Take 20 mg by mouth as needed for spasms.      . hydrochlorothiazide (HYDRODIURIL) 25 MG tablet Take 25 mg by mouth daily.      . traMADol (ULTRAM) 50 MG tablet Take by mouth as needed.      . metoprolol succinate (TOPROL XL) 25 MG 24 hr tablet Take 1 tablet (25 mg total) by mouth daily.  30 tablet  6   No current  facility-administered medications for this visit.    Allergies:    Allergies  Allergen Reactions  . Imitrex [Sumatriptan] Hives  . Penicillins Hives  . Prozac [Fluoxetine Hcl] Hives  . Sulfa Antibiotics Hives  . Zoloft [Sertraline Hcl] Hives    Social History:  The patient  reports that she quit smoking about 17 months ago. She does not have any smokeless tobacco history on file. She reports that she drinks alcohol. She reports that she does not use illicit drugs.   Family History:  The patient's family history includes Aneurysm in her father; Heart disease in her mother; Hypertension in her mother and sister.   ROS:  Please see the history of present illness.      All other systems reviewed and negative.   PHYSICAL EXAM: VS:  BP 138/82  Pulse 115  Ht 5\' 8"  (1.727 m) Well nourished, well developed, in no acute distress HEENT: normal Neck: no JVD Cardiac:  normal S1, S2; RRR; no murmur Lungs:  clear to auscultation bilaterally, no wheezing, rhonchi or rales Abd: soft, nontender, no hepatomegaly Ext: no edema Skin: warm and dry Neuro:  CNs  2-12 intact, no focal abnormalities noted  EKG:  Sinus tachycardia at 115bpm  ASSESSMENT AND PLAN:  1.  Sinus tachycardia ? Etiology - check d-dimer, TSH, CBC, BMET, cardiac enzymes - start Toprol 25mg  daily 2.  Chest pain - atypical and only occurs with her palpitations or with deep breathing.  EKG with nonspecific ST abnormality - check 2D echo to rule out pericardial effusion - Stress myoview in am - check Troponin/CPK stat - I instructed her to go to ER if her symptoms worsen  Followup with me in 2 weeks  Signed, Armanda Magicraci Turner, MD 11/30/2013 5:30 PM

## 2013-12-01 ENCOUNTER — Encounter: Payer: Self-pay | Admitting: General Surgery

## 2013-12-01 ENCOUNTER — Encounter: Payer: Self-pay | Admitting: *Deleted

## 2013-12-01 ENCOUNTER — Other Ambulatory Visit: Payer: Self-pay

## 2013-12-01 ENCOUNTER — Ambulatory Visit (HOSPITAL_COMMUNITY): Payer: 59 | Attending: Cardiology | Admitting: Radiology

## 2013-12-01 ENCOUNTER — Ambulatory Visit (HOSPITAL_BASED_OUTPATIENT_CLINIC_OR_DEPARTMENT_OTHER): Payer: 59 | Admitting: Cardiology

## 2013-12-01 DIAGNOSIS — R079 Chest pain, unspecified: Secondary | ICD-10-CM | POA: Diagnosis present

## 2013-12-01 DIAGNOSIS — R000 Tachycardia, unspecified: Secondary | ICD-10-CM

## 2013-12-01 DIAGNOSIS — I498 Other specified cardiac arrhythmias: Secondary | ICD-10-CM

## 2013-12-01 DIAGNOSIS — R0602 Shortness of breath: Secondary | ICD-10-CM

## 2013-12-01 DIAGNOSIS — I341 Nonrheumatic mitral (valve) prolapse: Secondary | ICD-10-CM

## 2013-12-01 DIAGNOSIS — R9431 Abnormal electrocardiogram [ECG] [EKG]: Secondary | ICD-10-CM

## 2013-12-01 DIAGNOSIS — R002 Palpitations: Secondary | ICD-10-CM

## 2013-12-01 NOTE — Progress Notes (Signed)
[facility] SITE 3 NUCLEAR MED [address] [place], [place] [address] [phone]    Cardiology Nuclear Med Study  Victoria Barr is a 43 y.o. female     MRN : [id number]     DOB: 1971-05-03  Procedure Date: 12/01/2013  Nuclear Med Background Indication for Stress Test:  Evaluation for Ischemia History:  No previous documented CAD; Nl. GXT 2012; Echo 2012- EF= 72% Cardiac Risk Factors: Family History - CAD, History of Smoking, Hypertension and Lipids  Symptoms:  Chest Pain (twinge), Dizziness, DOE, Palpitations and SOB   Nuclear Pre-Procedure Caffeine/Decaff Intake:  None NPO After: 7:00pm   Lungs:  clear O2 Sat: 99% on room air. IV 0.9% NS with Angio Cath:  22g  IV Site: R Antecubital  IV Started by:  Burna MortimerWanda Deal, RT-N  Chest Size (in):  38 Cup Size: D  Height: 5\' 8"  (1.727 m)  Weight:  176 lb (79.833 kg)  BMI:  Body mass index is 26.77 kg/(m^2). Tech Comments:  N/A    Nuclear Med Study 1 or 2 day study: 1 day  Stress Test Type:  Stress  Reading MD: Willa RoughJeffrey Dimitry Holsworth, MD Order Authorizing Provider:  Armanda Magicraci Turner, MD  Resting Radionuclide: Technetium 3068m Sestamibi  Resting Radionuclide Dose: 10.8 mCi   Stress Radionuclide:  Technetium 4968m Sestamibi  Stress Radionuclide Dose: 32.0 mCi           Stress Protocol Rest HR: 71 Stress HR: 162  Rest BP: 116/51 Stress BP: 198/91  Exercise Time (min): 6:15 METS: 7.0           Dose of Adenosine (mg):  n/a Dose of Lexiscan: n/a mg  Dose of Atropine (mg): n/a Dose of Dobutamine: n/a mcg/kg/min (at max HR)  Stress Test Technologist: Nelson ChimesSharon Brooks, BS-ES  Nuclear Technologist:  Leonia CoronaWanda Deal, RT-N     Rest Procedure:  Myocardial perfusion imaging was performed at rest 45 minutes following the intravenous administration of Technetium 5368m Sestamibi. Rest ECG: Normal EKG  Stress Procedure:  The patient exercised on the treadmill utilizing the Bruce Protocol for 6:15 minutes. The patient stopped due to fatigue and denied any  chest pain.  Technetium 1468m Sestamibi was injected at peak exercise and myocardial perfusion imaging was performed after a brief delay. Stress ECG: No significant change from baseline ECG  QPS Raw Data Images:  Normal; no motion artifact; normal heart/lung ratio. Stress Images:  Normal homogeneous uptake in all areas of the myocardium. Rest Images:  Normal homogeneous uptake in all areas of the myocardium. Subtraction (SDS):  No evidence of ischemia. Transient Ischemic Dilatation (Normal <1.22):  0.90 Lung/Heart Ratio (Normal <0.45):  .30  Quantitative Gated Spect Images QGS EDV:  61 ml QGS ESV:  14 ml  Impression Exercise Capacity:  Fair exercise capacity. BP Response:  Normal blood pressure response. Clinical Symptoms:  No significant symptoms noted. ECG Impression:  No significant ST segment change suggestive of ischemia. Comparison with Prior Nuclear Study: No previous nuclear study performed  Overall Impression:  Normal stress nuclear study.  No ischemia. Low risk scan.  LV Ejection Fraction: 77%.  LV Wall Motion:  Normal Wall Motion.  Willa RoughJeffrey Seydina Holliman, MD

## 2013-12-01 NOTE — Progress Notes (Signed)
Echo completed

## 2013-12-04 ENCOUNTER — Other Ambulatory Visit: Payer: Self-pay | Admitting: *Deleted

## 2013-12-04 ENCOUNTER — Telehealth: Payer: Self-pay | Admitting: General Surgery

## 2013-12-04 NOTE — Telephone Encounter (Signed)
Pt has been very happy with her new medication. She has not felt any irregularity since starting. Pulse has also been normal. Do you still need to see her in two weeks?

## 2013-12-04 NOTE — Telephone Encounter (Signed)
Received a PA for metoprolol succinate, the formulary will cover the toprol xl under preferred list, I called her [facility] for trial run of the toprol xl and it went through so we will fill as toprol xl.

## 2013-12-04 NOTE — Telephone Encounter (Signed)
Ok to move appt out to 4 weeks

## 2013-12-05 NOTE — Telephone Encounter (Signed)
PT is aware and scheduled for appt.

## 2013-12-07 ENCOUNTER — Ambulatory Visit (INDEPENDENT_AMBULATORY_CARE_PROVIDER_SITE_OTHER): Payer: 59 | Admitting: Emergency Medicine

## 2013-12-07 VITALS — BP 120/72 | HR 73 | Temp 97.9°F | Resp 16 | Ht 68.5 in | Wt 177.0 lb

## 2013-12-07 DIAGNOSIS — I498 Other specified cardiac arrhythmias: Secondary | ICD-10-CM

## 2013-12-07 DIAGNOSIS — I471 Supraventricular tachycardia: Secondary | ICD-10-CM

## 2013-12-07 DIAGNOSIS — I059 Rheumatic mitral valve disease, unspecified: Secondary | ICD-10-CM

## 2013-12-07 DIAGNOSIS — I341 Nonrheumatic mitral (valve) prolapse: Secondary | ICD-10-CM

## 2013-12-07 MED ORDER — METOPROLOL SUCCINATE ER 25 MG PO TB24: 25.0000 mg | ORAL_TABLET | Freq: Every day | ORAL | Status: DC

## 2013-12-07 MED ORDER — AMITRIPTYLINE HCL 25 MG PO TABS: 25.0000 mg | ORAL_TABLET | Freq: Every day | ORAL | Status: DC

## 2013-12-07 MED ORDER — TRAMADOL HCL 50 MG PO TABS: 50.0000 mg | ORAL_TABLET | Freq: Four times a day (QID) | ORAL | Status: DC | PRN

## 2013-12-07 MED ORDER — HYDROCHLOROTHIAZIDE 25 MG PO TABS: 25.0000 mg | ORAL_TABLET | Freq: Every day | ORAL | Status: DC

## 2013-12-07 MED ORDER — CELECOXIB 200 MG PO CAPS: 200.0000 mg | ORAL_CAPSULE | Freq: Every day | ORAL | Status: DC

## 2013-12-07 NOTE — Progress Notes (Signed)
[facility] [address], [place] [address] [phone]  Date:  12/07/2013   Name:  Victoria Barr   DOB:  12/06/1970   MRN:  [id number]  PCP:  No PCP Per Patient    Chief Complaint: med refills and Advice Only   History of Present Illness:  Victoria Barr is a 43 y.o. very pleasant female patient who presents with the following:  Says she has noticed recent onset swelling of her feet and ankles and was recently diagnosed with a rapid heart rate and put on metaprolol and her dose of HCTZ was increased.  She had a negative stress test and labs. Is followed by cardiologist. Non smoker.  No improvement with over the counter medications or other home remedies. Denies other complaint or health concern today.   Patient Active Problem List   Diagnosis Date Noted  . Chest pain 11/30/2013  . Abnormal EKG 11/30/2013  . Palpitations   . MVP (mitral valve prolapse)   . Acute bronchitis 06/09/2013  . Periodic health assessment, general screening, adult 06/09/2013  . Dysuria 06/09/2013    Past Medical History  Diagnosis Date  . Hypertension   . Palpitations   . MVP (mitral valve prolapse)     echo in 2012 did not show MVP but did have mild elongation of the AMVL  . Allergy   . Anxiety   . Arthritis   . Depression   . Heart murmur   . Hyperlipidemia   . Tachycardia   . Intestinal malrotation     Past Surgical History  Procedure Laterality Date  . Intestinal malrotation repair    . Foot surgery    . Tubal ligation    . Appendectomy    . Colon surgery      History  Substance Use Topics  . Smoking status: Former Smoker    Quit date: 06/02/2012  . Smokeless tobacco: Not on file     Comment: quit 1 year ago, sep 2013  . Alcohol Use: No     Comment: occasionally    Family History  Problem Relation Age of Onset  . Heart disease Mother   . Hypertension Mother   . Aneurysm Father   . Hypertension Sister   . Heart disease Maternal Grandmother    . Cancer Paternal Grandmother   . Cancer Paternal Grandfather     Allergies  Allergen Reactions  . Imitrex [Sumatriptan] Hives  . Penicillins Hives  . Prozac [Fluoxetine Hcl] Hives  . Sulfa Antibiotics Hives  . Zoloft [Sertraline Hcl] Hives    Medication list has been reviewed and updated.  Current Outpatient Prescriptions on File Prior to Visit  Medication Sig Dispense Refill  . amitriptyline (ELAVIL) 25 MG tablet Take 25 mg by mouth at bedtime.      Marland Kitchen BIOTIN PO Take by mouth daily.      . celecoxib (CELEBREX) 200 MG capsule Take 200 mg by mouth daily.      . Cholecalciferol (VITAMIN D PO) Take by mouth daily.      . hydrochlorothiazide (HYDRODIURIL) 25 MG tablet Take 25 mg by mouth daily.      . metoprolol succinate (TOPROL XL) 25 MG 24 hr tablet Take 1 tablet (25 mg total) by mouth daily.  30 tablet  6  . traMADol (ULTRAM) 50 MG tablet Take by mouth as needed.       No current facility-administered medications on file prior to visit.    Review of Systems:  As per HPI, otherwise negative.    Physical Examination: Filed Vitals:   12/07/13 1855  BP: 120/72  Pulse: 73  Temp: 97.9 F (36.6 C)  Resp: 16   Filed Vitals:   12/07/13 1855  Height: 5' 8.5" (1.74 m)  Weight: 177 lb (80.287 kg)   Body mass index is 26.52 kg/(m^2). Ideal Body Weight: Weight in (lb) to have BMI = 25: 166.5  GEN: WDWN, NAD, Non-toxic, A & O x 3 HEENT: Atraumatic, Normocephalic. Neck supple. No masses, No LAD. Ears and Nose: No external deformity. CV: RRR, No M/G/R. No JVD. No thrill. No extra heart sounds. PULM: CTA B, no wheezes, crackles, rhonchi. No retractions. No resp. distress. No accessory muscle use. ABD: S, NT, ND, +BS. No rebound. No HSM. EXTR: No c/c/e NEURO Normal gait.  PSYCH: Normally interactive. Conversant. Not depressed or anxious appearing.  Calm demeanor.    Assessment and Plan: PSVT Continue meds   Signed,  Phillips OdorJeffery Donni Oglesby, MD

## 2013-12-07 NOTE — Patient Instructions (Signed)
Mitral Valve Prolapse The mitral valve is located between the top and bottom parts of the heart on the left side. A mitral valve prolapse (MVP) is an abnormal bulging of 1 or both of the 2 mitral leaflets. The valve bulges into the top chamber (atrium) of the heart when the bottom chamber (ventricle) squeezes or contracts. MVP is more common in females. It is an inherited problem and is usually not found until adolescence. It is not harmful and rarely needs other treatment. PROBLEMS MAY INCLUDE:  Chest pain.  Palpitations.  Anxiety.  Panic attacks.  Stroke, rarely. HOME CARE INSTRUCTIONS   Taking antibiotics before a dental or other medical procedure is no longer routine. Consult with your caregiver.  Exercise as your caregiver instructs.  Discuss cardiac risk factors associated with MVP with your caregiver. SEEK IMMEDIATE MEDICAL CARE IF:   You develop frequent episodes of chest pain or an irregular heartbeat.  You faint or pass out.  You have severe chest pain or shortness of breath.  You develop palpitations with weakness or dizziness.  You have difficulty with vision or swallowing or weakness or numbness on one side of your body. MAKE SURE YOU:   Understand these instructions.  Will watch your condition.  Will get help right away if you are not doing well or get worse. Document Released: 09/04/2000 Document Revised: 11/30/2011 Document Reviewed: 11/04/2007 [facility] Patient Information 2014 [employer] [place].

## 2014-01-03 ENCOUNTER — Encounter: Payer: Self-pay | Admitting: Cardiology

## 2014-01-03 ENCOUNTER — Ambulatory Visit (INDEPENDENT_AMBULATORY_CARE_PROVIDER_SITE_OTHER): Payer: 59 | Admitting: Cardiology

## 2014-01-03 VITALS — BP 122/80 | HR 91 | Ht 68.0 in | Wt 179.0 lb

## 2014-01-03 DIAGNOSIS — R079 Chest pain, unspecified: Secondary | ICD-10-CM

## 2014-01-03 DIAGNOSIS — R002 Palpitations: Secondary | ICD-10-CM

## 2014-01-03 MED ORDER — METOPROLOL SUCCINATE ER 25 MG PO TB24: 25.0000 mg | ORAL_TABLET | Freq: Every day | ORAL | Status: DC

## 2014-01-03 NOTE — Progress Notes (Signed)
[address] [place], [place]  [address] Phone: [phone] Fax:  [phone]  Date:  01/03/2014   ID:  Victoria Barr, DOB [birth year], MRN [id number]  PCP:  No PCP Per Patient  Cardiologist:  Armanda Magicraci TUrner, MD     History of Present Illness: Victoria Barr is a 43 y.o. female with a history of MV prolapse diagnosed as a teenager but echo in 2012 with no MVP but flat coaptation of the AMVL with mild eccentric MR who presented recently for evaluation of palpitations. She has a history of palpitations in the past and was placed on Toprol but eventually stopped.  She was also having  CP that was nonexertional but could come on some with exertion. The CP would only lasts a few seconds and only happened when her heart was racing. Her cardiac workup was normal with a normal nuclear stress test, 2D echo and cardiac enzymes/d-dimer and TSH.  She was started on Toprol and presents back today for followup.  She says that she occasionally has some chest pain but mild.  She occasionally has some palpitations and her HR usually runs about 90bpm towards the time she is ready for her next dose.  She does drink 2 cups of coffee daily.      Wt Readings from Last 3 Encounters:  12/07/13 177 lb (80.287 kg)  12/01/13 176 lb (79.833 kg)  06/09/13 168 lb (76.204 kg)     Past Medical History  Diagnosis Date  . Hypertension   . Palpitations   . MVP (mitral valve prolapse)     echo in 2012 did not show MVP but did have mild elongation of the AMVL  . Allergy   . Anxiety   . Arthritis   . Depression   . Heart murmur   . Hyperlipidemia   . Tachycardia   . Intestinal malrotation     Current Outpatient Prescriptions  Medication Sig Dispense Refill  . amitriptyline (ELAVIL) 25 MG tablet Take 1 tablet (25 mg total) by mouth at bedtime.  30 tablet  5  . BIOTIN PO Take by mouth daily.      . celecoxib (CELEBREX) 200 MG capsule Take 1 capsule (200 mg total) by mouth daily.  30 capsule  12  .  Cholecalciferol (VITAMIN D PO) Take by mouth daily.      . hydrochlorothiazide (HYDRODIURIL) 25 MG tablet Take 1 tablet (25 mg total) by mouth daily.  30 tablet  5  . metoprolol succinate (TOPROL XL) 25 MG 24 hr tablet Take 1 tablet (25 mg total) by mouth daily.  30 tablet  6  . traMADol (ULTRAM) 50 MG tablet Take 1 tablet (50 mg total) by mouth 4 (four) times daily as needed.  40 tablet  2   No current facility-administered medications for this visit.    Allergies:    Allergies  Allergen Reactions  . Imitrex [Sumatriptan] Hives  . Penicillins Hives  . Prozac [Fluoxetine Hcl] Hives  . Sulfa Antibiotics Hives  . Zoloft [Sertraline Hcl] Hives    Social History:  The patient  reports that she quit smoking about 19 months ago. She does not have any smokeless tobacco history on file. She reports that she does not drink alcohol or use illicit drugs.   Family History:  The patient's family history includes Aneurysm in her father; Cancer in her paternal grandfather and paternal grandmother; Heart disease in her maternal grandmother and mother; Hypertension in her mother and sister.  ROS:  Please see the history of present illness.      All other systems reviewed and negative.   PHYSICAL EXAM: VS:  Ht 5\' 8"  (1.727 m)  LMP 12/03/2013 Well nourished, well developed, in no acute distress HEENT: normal Neck: no JVD Cardiac:  normal S1, S2; RRR; no murmur Lungs:  clear to auscultation bilaterally, no wheezing, rhonchi or rales Abd: soft, nontender, no hepatomegaly Ext: no edema Skin: warm and dry Neuro:  CNs 2-12 intact, no focal abnormalities noted       ASSESSMENT AND PLAN:  1. Atypical chest pain with negative cardiac workup 2. Palpitations much improved on Toprol which she will continue  Followup with me in 1 year  Signed, Armanda Magicraci Turner, MD 01/03/2014 8:42 AM

## 2014-01-03 NOTE — Patient Instructions (Signed)
Your physician recommends that you continue on your current medications as directed. Please refer to the Current Medication list given to you today.  Your physician wants you to follow-up in: 1 year with Dr. Turner. You will receive a reminder letter in the mail two months in advance. If you don't receive a letter, please call our office to schedule the follow-up appointment.  

## 2014-01-04 ENCOUNTER — Ambulatory Visit (INDEPENDENT_AMBULATORY_CARE_PROVIDER_SITE_OTHER): Payer: 59 | Admitting: Emergency Medicine

## 2014-01-04 VITALS — BP 132/68 | HR 78 | Temp 97.7°F | Resp 16 | Ht 69.0 in | Wt 179.0 lb

## 2014-01-04 DIAGNOSIS — J018 Other acute sinusitis: Secondary | ICD-10-CM

## 2014-01-04 DIAGNOSIS — M722 Plantar fascial fibromatosis: Secondary | ICD-10-CM

## 2014-01-04 MED ORDER — NAPROXEN SODIUM 550 MG PO TABS: 550.0000 mg | ORAL_TABLET | Freq: Two times a day (BID) | ORAL | Status: DC

## 2014-01-04 MED ORDER — PROMETHAZINE-CODEINE 6.25-10 MG/5ML PO SYRP: 5.0000 mL | ORAL_SOLUTION | Freq: Four times a day (QID) | ORAL | Status: DC | PRN

## 2014-01-04 MED ORDER — PSEUDOEPHEDRINE-GUAIFENESIN ER 60-600 MG PO TB12: 1.0000 | ORAL_TABLET | Freq: Two times a day (BID) | ORAL | Status: DC

## 2014-01-04 MED ORDER — LEVOFLOXACIN 500 MG PO TABS: 500.0000 mg | ORAL_TABLET | Freq: Every day | ORAL | Status: AC

## 2014-01-04 NOTE — Patient Instructions (Signed)

## 2014-01-04 NOTE — Progress Notes (Signed)
[facility] [address], [place] [address] [phone]  Date:  01/04/2014   Name:  Victoria Barr   DOB:  07/27/1971   MRN:  [id number]  PCP:  No PCP Per Patient    Chief Complaint: Foot Pain, Headache and arm numbness   History of Present Illness:  Victoria Barr is a 43 y.o. very pleasant female patient who presents with the following:  Two complaints.  On feet all day and has pain in feet.  Pain increases in AM on arising and is constant through the day.  Bilateral.  No history of injury or overuse.  No swelling or ecchymosis.  Present for two weeks.  No improvement with over the counter medications or other home remedies. . Has nasal congestion and pressure in cheeks and forehead.  No neuro or visual symptoms.  Has no nasal discharge but moderate post nasal drip.  Some sore throat.  Has cough productive of purulent sputum. No wheezing or shortness of breath.  Some hoarseness.  No fever or chills.  No nausea or vomiting.  No improvement with over the counter medications or other home remedies. Denies other complaint or health concern today.   Patient Active Problem List   Diagnosis Date Noted  . Chest pain 11/30/2013  . Abnormal EKG 11/30/2013  . Palpitations   . MVP (mitral valve prolapse)   . Acute bronchitis 06/09/2013  . Periodic health assessment, general screening, adult 06/09/2013  . Dysuria 06/09/2013    Past Medical History  Diagnosis Date  . Hypertension   . Palpitations   . MVP (mitral valve prolapse)     echo in 2012 did not show MVP but did have mild elongation of the AMVL  . Allergy   . Anxiety   . Arthritis   . Depression   . Heart murmur   . Hyperlipidemia   . Tachycardia   . Intestinal malrotation     Past Surgical History  Procedure Laterality Date  . Intestinal malrotation repair    . Foot surgery    . Tubal ligation    . Appendectomy    . Colon surgery      History  Substance Use Topics  . Smoking status:  Former Smoker    Quit date: 06/02/2012  . Smokeless tobacco: Not on file     Comment: quit 1 year ago, sep 2013  . Alcohol Use: No     Comment: occasionally    Family History  Problem Relation Age of Onset  . Heart disease Mother   . Hypertension Mother   . Aneurysm Father   . Hypertension Sister   . Heart disease Maternal Grandmother   . Cancer Paternal Grandmother   . Cancer Paternal Grandfather     Allergies  Allergen Reactions  . Imitrex [Sumatriptan] Hives  . Penicillins Hives  . Prozac [Fluoxetine Hcl] Hives  . Sulfa Antibiotics Hives  . Zoloft [Sertraline Hcl] Hives    Medication list has been reviewed and updated.  Current Outpatient Prescriptions on File Prior to Visit  Medication Sig Dispense Refill  . amitriptyline (ELAVIL) 25 MG tablet Take 1 tablet (25 mg total) by mouth at bedtime.  30 tablet  5  . BIOTIN PO Take by mouth daily.      . celecoxib (CELEBREX) 200 MG capsule Take 1 capsule (200 mg total) by mouth daily.  30 capsule  12  . hydrochlorothiazide (HYDRODIURIL) 25 MG tablet Take 1 tablet (25 mg total) by mouth  daily.  30 tablet  5  . metoprolol succinate (TOPROL XL) 25 MG 24 hr tablet Take 1 tablet (25 mg total) by mouth daily.  90 tablet  3  . traMADol (ULTRAM) 50 MG tablet Take 1 tablet (50 mg total) by mouth 4 (four) times daily as needed.  40 tablet  2   No current facility-administered medications on file prior to visit.    Review of Systems:  As per HPI, otherwise negative.    Physical Examination: Filed Vitals:   01/04/14 1749  BP: 132/68  Pulse: 78  Temp: 97.7 F (36.5 C)  Resp: 16   Filed Vitals:   01/04/14 1749  Height: 5\' 9"  (1.753 m)  Weight: 179 lb (81.194 kg)   Body mass index is 26.42 kg/(m^2). Ideal Body Weight: Weight in (lb) to have BMI = 25: 168.9  GEN: WDWN, NAD, Non-toxic, A & O x 3 HEENT: Atraumatic, Normocephalic. Neck supple. No masses, No LAD. Ears and Nose: No external deformity. CV: RRR, No M/G/R. No  JVD. No thrill. No extra heart sounds. PULM: CTA B, no wheezes, crackles, rhonchi. No retractions. No resp. distress. No accessory muscle use. ABD: S, NT, ND, +BS. No rebound. No HSM. EXTR: No c/c/e NEURO Normal gait.  PSYCH: Normally interactive. Conversant. Not depressed or anxious appearing.  Calm demeanor.  Feet:  Tender at heel at origin of plantar fascia.  No swelling or ecchymosis.    Assessment and Plan: Sinusitis Plantar fasciitis levaquin mucinex d Phen c cod Anaprox  Signed,  Phillips OdorJeffery Belle Charlie, MD

## 2014-01-24 ENCOUNTER — Ambulatory Visit (INDEPENDENT_AMBULATORY_CARE_PROVIDER_SITE_OTHER): Payer: 59 | Admitting: Podiatry

## 2014-01-24 ENCOUNTER — Ambulatory Visit (INDEPENDENT_AMBULATORY_CARE_PROVIDER_SITE_OTHER): Payer: 59

## 2014-01-24 ENCOUNTER — Encounter: Payer: Self-pay | Admitting: Podiatry

## 2014-01-24 VITALS — BP 145/73 | HR 84 | Resp 15

## 2014-01-24 DIAGNOSIS — R52 Pain, unspecified: Secondary | ICD-10-CM

## 2014-01-24 DIAGNOSIS — M722 Plantar fascial fibromatosis: Secondary | ICD-10-CM

## 2014-01-24 MED ORDER — TRIAMCINOLONE ACETONIDE 10 MG/ML IJ SUSP
10.0000 mg | Freq: Once | INTRAMUSCULAR | Status: AC
  Administered 2014-01-24: 10 mg

## 2014-01-24 MED ORDER — DICLOFENAC SODIUM 75 MG PO TBEC: 75.0000 mg | DELAYED_RELEASE_TABLET | Freq: Two times a day (BID) | ORAL | Status: DC

## 2014-01-24 NOTE — Progress Notes (Signed)
   Subjective:    Patient ID: Victoria Barr, female    DOB: Sep 17, 1971, 43 y.o.   MRN: [id number]  HPI Severe pain in feet bilaterally, "feels like my feet are on fire, my bones hurt" Pain worsens with walking. Has tried different types of shoes with no relief. Pt stated, knot on top of right foot.    Review of Systems  Musculoskeletal: Positive for gait problem.  All other systems reviewed and are negative.      Objective:   Physical Exam        Assessment & Plan:

## 2014-01-24 NOTE — Patient Instructions (Signed)

## 2014-01-25 NOTE — Progress Notes (Signed)
Subjective:     Patient ID: Victoria Barr, female   DOB: 1971-01-12, 43 y.o.   MRN: [id number]  Foot Pain   patient presents with heel pain on both feet stating that her arches her forefoot hurts but it's mostly derived from heel it has been present for several months. Has a small knot on top of her right foot that she wanted to get looked at   Review of Systems  All other systems reviewed and are negative.      Objective:   Physical Exam  Nursing note and vitals reviewed. Constitutional: She is oriented to person, place, and time.  Cardiovascular: Intact distal pulses.   Musculoskeletal: Normal range of motion.  Neurological: She is oriented to person, place, and time.  Skin: Skin is warm.   neurovascular status is intact muscle strength is adequate and range of motion the subtalar midtarsal joint is normal. Mild equinus condition is noted digits are well perfused and there is some depression of the arch on both feet. Intense discomfort plantar fascia right over left heel with inflammation and fluid buildup noted and a small nodule dorsal right foot which appears to be a cystic formation that is not painful.     Assessment:     Plan her fasciitis of intense nature heel both feet and small probable ganglionic cyst right    Plan:     H&P and x-rays reviewed. Injected the plantar fascia bilateral 3 mg Kenalog 5 mg Xylocaine Marcaine mixture dispensed fascial brace for the right foot and instructed on anti-inflammatories Voltaren 75 mg twice a day and reduced activity. Gave instructions on physical therapy and heat to the cyst top her right foot which we will watch and decide whether it should be drained at one point in the future. Discussed orthotics and reappoint in 1 week

## 2014-02-14 ENCOUNTER — Encounter: Payer: 59 | Admitting: Gynecology

## 2014-02-28 ENCOUNTER — Encounter: Payer: Self-pay | Admitting: Nurse Practitioner

## 2014-02-28 ENCOUNTER — Ambulatory Visit (INDEPENDENT_AMBULATORY_CARE_PROVIDER_SITE_OTHER): Payer: 59 | Admitting: Nurse Practitioner

## 2014-02-28 VITALS — BP 110/66 | HR 88 | Ht 68.5 in | Wt 174.0 lb

## 2014-02-28 DIAGNOSIS — Z01419 Encounter for gynecological examination (general) (routine) without abnormal findings: Secondary | ICD-10-CM

## 2014-02-28 DIAGNOSIS — R319 Hematuria, unspecified: Secondary | ICD-10-CM

## 2014-02-28 DIAGNOSIS — N9489 Other specified conditions associated with female genital organs and menstrual cycle: Secondary | ICD-10-CM

## 2014-02-28 DIAGNOSIS — Z113 Encounter for screening for infections with a predominantly sexual mode of transmission: Secondary | ICD-10-CM

## 2014-02-28 DIAGNOSIS — Z0000 Encounter for general adult medical examination without abnormal findings: Secondary | ICD-10-CM

## 2014-02-28 DIAGNOSIS — R87810 Cervical high risk human papillomavirus (HPV) DNA test positive: Secondary | ICD-10-CM

## 2014-02-28 LAB — POCT URINALYSIS DIPSTICK
Bilirubin, UA: NEGATIVE
Glucose, UA: NEGATIVE
Ketones, UA: NEGATIVE
Nitrite, UA: NEGATIVE
Protein, UA: NEGATIVE
Urobilinogen, UA: NEGATIVE
pH, UA: 5
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
## 2014-02-28 NOTE — Patient Instructions (Signed)

## 2014-03-01 LAB — IPS N GONORRHOEA AND CHLAMYDIA BY PCR

## 2014-03-01 LAB — URINALYSIS, MICROSCOPIC ONLY
Bacteria, UA: NONE SEEN
Casts: NONE SEEN
Crystals: NONE SEEN
Squamous Epithelial / LPF: NONE SEEN

## 2014-03-01 LAB — STD PANEL
HIV 1&2 Ab, 4th Generation: NONREACTIVE
Hepatitis B Surface Ag: NEGATIVE

## 2014-03-02 LAB — URINE CULTURE
Colony Count: NO GROWTH
Organism ID, Bacteria: NO GROWTH

## 2014-03-05 LAB — IPS PAP TEST WITH HPV

## 2014-03-05 NOTE — Progress Notes (Signed)
Encounter reviewed by Dr. Brook Silva.  

## 2014-03-06 NOTE — Addendum Note (Signed)
Addended by: Joeseph AmorFAST, Leon Goodnow L on: 03/06/2014 12:31 PM   Modules accepted: Orders

## 2014-03-07 NOTE — Addendum Note (Signed)
Addended by: Verner CholLEONARD, DEBORAH S on: 03/07/2014 08:39 AM   Modules accepted: Orders

## 2014-03-12 ENCOUNTER — Telehealth: Payer: Self-pay | Admitting: Emergency Medicine

## 2014-03-12 ENCOUNTER — Encounter: Payer: Self-pay | Admitting: Emergency Medicine

## 2014-03-12 DIAGNOSIS — R87810 Cervical high risk human papillomavirus (HPV) DNA test positive: Secondary | ICD-10-CM

## 2014-03-12 NOTE — Telephone Encounter (Signed)
Spoke with patient and advised of message from Verner Choleborah S. Leonard CNM. Patient saw Lauro FranklinPatricia Rolen-Grubb, FNP  For annual. Patient also needs to schedule pelvic ultrasound.  Pap Smear negative, + HR HPV 02/28/14.  Remote History of LGSIL with colpo 2006 Hx Tubal Ligation.  Patient notified of message from Verner Choleborah S. Leonard CNM.  She is agreeable to scheduling colposcopy. Brief description of procedure given to patient. She states she remembers procedure from the past and states "it's so horrible." She requests late afternoon appointment so that she will not have to go back to work after procedure. Advised we will do what we can to keep her comfortable during the procedure.  Scheduled colposcopy with Dr. Edward JollySilva as patient will have PUS with Dr. Edward JollySilva on 03/22/14.  Colposcopy pre-procedure instructions given. Advised 800 mg of Motrin with food one hour prior to appointment. Motrin=Advil=Ibuprofen Can take 800 mg (Can purchase over the counter, you will need four 200 mg pills). Make sure to eat a meal before appointment and drink plenty of fluids. Patient verbalized understanding and will call to reschedule if will be on menses, states has period around the 15th each month. . Advised will need to cancel within 24 hours or will have $100.00 no show fee placed to account.  Type of birth control : tubal ligation LMP 03/05/16  Scheduled colposcopy with Dr. Edward JollySilva for 03/28/14 at 1530. She is agreeable. Notified of cancellation policy-yes Scheduled procedure room-yes   Patient given precert information, declinced to discuss with insurance department at this time, she states she will call back to discuss. Colposcopy: pre-cert complete/pr $73.77 PUS.$80.60  Routing to provider for final review. Patient agreeable to disposition. Will close encounter

## 2014-03-12 NOTE — Telephone Encounter (Signed)
Message copied by Joeseph AmorFAST, TRACY L on Mon Mar 12, 2014 10:20 AM ------      Message from: Verner CholLEONARD, DEBORAH S      Created: Wed Mar 07, 2014  8:38 AM       Notify patient that pap smear is negative, but HPVHR is detected. She needs colposcopy evaluation. Order in. Please schedule ------

## 2014-03-19 ENCOUNTER — Telehealth: Payer: Self-pay | Admitting: Nurse Practitioner

## 2014-03-19 NOTE — Telephone Encounter (Signed)
Patient said she was told that she needed a colpo but received results from the lab over the weekend that her pap results were negative. She wants to know why she would need a colpo if results were negative.

## 2014-03-19 NOTE — Telephone Encounter (Signed)
Patient with hx of abnormal pap smear in 2005 with colposcopy.  Now with normal pap smear, reactive squamous cells present with + hr hpv.   Message left to return call to [facility] at [phone]. Advised telephones off at this hour but can return call tomorrow at 0830.

## 2014-03-22 ENCOUNTER — Encounter: Payer: Self-pay | Admitting: Obstetrics and Gynecology

## 2014-03-22 ENCOUNTER — Ambulatory Visit (INDEPENDENT_AMBULATORY_CARE_PROVIDER_SITE_OTHER): Payer: 59 | Admitting: Obstetrics and Gynecology

## 2014-03-22 ENCOUNTER — Ambulatory Visit (INDEPENDENT_AMBULATORY_CARE_PROVIDER_SITE_OTHER): Payer: 59

## 2014-03-22 VITALS — BP 100/64 | HR 84 | Ht 68.5 in | Wt 172.0 lb

## 2014-03-22 DIAGNOSIS — M25559 Pain in unspecified hip: Secondary | ICD-10-CM

## 2014-03-22 DIAGNOSIS — N9489 Other specified conditions associated with female genital organs and menstrual cycle: Secondary | ICD-10-CM

## 2014-03-22 DIAGNOSIS — R300 Dysuria: Secondary | ICD-10-CM

## 2014-03-22 DIAGNOSIS — R319 Hematuria, unspecified: Secondary | ICD-10-CM

## 2014-03-22 DIAGNOSIS — N946 Dysmenorrhea, unspecified: Secondary | ICD-10-CM

## 2014-03-22 LAB — POCT URINALYSIS DIPSTICK
Bilirubin, UA: NEGATIVE
Glucose, UA: NEGATIVE
Ketones, UA: NEGATIVE
Leukocytes, UA: NEGATIVE
Nitrite, UA: NEGATIVE
Protein, UA: NEGATIVE
Urobilinogen, UA: NEGATIVE
pH, UA: 5

## 2014-03-22 MED ORDER — NORETHINDRONE 0.35 MG PO TABS: 1.0000 | ORAL_TABLET | Freq: Every day | ORAL | Status: DC

## 2014-03-22 MED ORDER — PHENAZOPYRIDINE HCL 100 MG PO TABS: 100.0000 mg | ORAL_TABLET | Freq: Three times a day (TID) | ORAL | Status: DC | PRN

## 2014-03-22 MED ORDER — CIPROFLOXACIN HCL 500 MG PO TABS: 500.0000 mg | ORAL_TABLET | Freq: Two times a day (BID) | ORAL | Status: DC

## 2014-03-22 NOTE — Progress Notes (Signed)
GYNECOLOGY  VISIT   HPI: 43 y.o.   Single  Caucasian  female   G1P1001 with Patient's last menstrual period was 03/05/2014.   here for  Pelvic ultrasound for lower abdominal discomfort. Pulling sensation before and during menstruation for couple of months.  NSAIDS help cramping but not pulling feeling.  New sexual partner during the past couple of months.  STD testing is negative.  No change in vaginal discharge but is leaking from her bladder.  Bowel function unchanged.   Has bad cramping with urination.  HAS PMS changes.   Occasional smoker.  Took Depo Provera and had hair loss.   GYNECOLOGIC HISTORY: Patient's last menstrual period was 03/05/2014. Contraception:    Menopausal hormone therapy:         OB History   Grav Para Term Preterm Abortions TAB SAB Ect Mult Living   1 1 1       1          Patient Active Problem List   Diagnosis Date Noted  . Chest pain 11/30/2013  . Abnormal EKG 11/30/2013  . Palpitations   . MVP (mitral valve prolapse)   . Acute bronchitis 06/09/2013  . Periodic health assessment, general screening, adult 06/09/2013  . Dysuria 06/09/2013    Past Medical History  Diagnosis Date  . Palpitations   . MVP (mitral valve prolapse)     echo in 2012 did not show MVP but did have mild elongation of the AMVL  . Allergy   . Anxiety   . Arthritis   . Depression   . Hyperlipidemia   . Tachycardia   . Intestinal malrotation   . MVP (mitral valve prolapse)     diagnosed as teen and now has cleared  . Hypertension 05/2013    Past Surgical History  Procedure Laterality Date  . Intestinal malrotation repair  1999  . Tubal ligation    . Appendectomy    . Colon surgery    . Colposcopy  2006    LGSIL, normal since  . Excision morton's neuroma Bilateral 2002    Current Outpatient Prescriptions  Medication Sig Dispense Refill  . BIOTIN PO Take by mouth daily.      . celecoxib (CELEBREX) 200 MG capsule Take 1 capsule (200 mg total) by mouth  daily.  30 capsule  12   No current facility-administered medications for this visit.     ALLERGIES: Imitrex; Penicillins; Prozac; Sulfa antibiotics; and Zoloft  Family History  Problem Relation Age of Onset  . Heart disease Mother   . Hypertension Mother   . Aneurysm Father 36    brain   . Hypertension Sister   . Heart disease Maternal Grandmother   . Cancer Paternal Grandmother   . Cancer Paternal Grandfather     History   Social History  . Marital Status: Single    Spouse Name: N/A    Number of Children: N/A  . Years of Education: N/A   Occupational History  . Not on file.   Social History Main Topics  . Smoking status: Former Smoker    Quit date: 06/02/2012  . Smokeless tobacco: Never Used     Comment: quit 1 year ago, sep 2013  . Alcohol Use: No     Comment: occasionally  . Drug Use: No  . Sexual Activity: Not Currently    Birth Control/ Protection: Surgical     Comment: BTL   Other Topics Concern  . Not on file   Social History  Narrative  . No narrative on file    ROS:  Pertinent items are noted in HPI.  PHYSICAL EXAMINATION:    BP 100/64  Pulse 84  Ht 5' 8.5" (1.74 m)  Wt 172 lb (78.019 kg)  BMI 25.77 kg/m2  LMP 03/05/2014     General appearance: alert, cooperative and appears stated age   UA - 1+ RBCs.  ASSESSMENT  Hematuria. Dysuria.  Pelvic pain.   Dysmenorrhea. Smoker.   PLAN  Urine culture.  Ciprofloxacin 500 mg po bid for 7 days.  Pyridium 100 - 2-- mg po tid prn.  Hydrate well.  Potential etiologies of pelvic pain discussed. Endometriosis, adhesive disease, bowel origin.   Discussed treatment options for pain - Progesterone only OCPs, IUDs, Nexplanon, Depo Provera, laparoscopy. Micronor 1 pack with 3 refills.  Instructed in use.  Follow up in 3 months.   Addendum -   Ultrasound report below - images and report reviewed with patient.  Normal uterus and ovaries.  No free fluid.      An After Visit Summary was  printed and given to the patient.  __25____ minutes face to face time of which over 50% was spent in counseling.

## 2014-03-22 NOTE — Patient Instructions (Signed)
Norethindrone tablets (contraception) What is this medicine? NORETHINDRONE (nor eth IN drone) is an oral contraceptive. The product contains a female hormone known as a progestin. It is used to prevent pregnancy. This medicine may be used for other purposes; ask your health care provider or pharmacist if you have questions. COMMON BRAND NAME(S): Camila, Deblitane 28-Day, Errin, Heather, Jencycla, Jolivette, Lyza, Nor-QD, Nora-BE, Norlyroc, Ortho Micronor, Sharobel 28-Day What should I tell my health care provider before I take this medicine? They need to know if you have any of these conditions: -blood vessel disease or blood clots -breast, cervical, or vaginal cancer -diabetes -heart disease -kidney disease -liver disease -mental depression -migraine -seizures -stroke -vaginal bleeding -an unusual or allergic reaction to norethindrone, other medicines, foods, dyes, or preservatives -pregnant or trying to get pregnant -breast-feeding How should I use this medicine? Take this medicine by mouth with a glass of water. You may take it with or without food. Follow the directions on the prescription label. Take this medicine at the same time each day and in the order directed on the package. Do not take your medicine more often than directed. Contact your pediatrician regarding the use of this medicine in children. Special care may be needed. This medicine has been used in female children who have started having menstrual periods. A patient package insert for the product will be given with each prescription and refill. Read this sheet carefully each time. The sheet may change frequently. Overdosage: If you think you have taken too much of this medicine contact a poison control center or emergency room at once. NOTE: This medicine is only for you. Do not share this medicine with others. What if I miss a dose? Try not to miss a dose. Every time you miss a dose or take a dose late your chance of  pregnancy increases. When 1 pill is missed (even if only 3 hours late), take the missed pill as soon as possible and continue taking a pill each day at the regular time (use a back up method of birth control for the next 48 hours). If more than 1 dose is missed, use an additional birth control method for the rest of your pill pack until menses occurs. Contact your health care professional if more than 1 dose has been missed. What may interact with this medicine? Do not take this medicine with any of the following medications: -amprenavir or fosamprenavir -bosentan This medicine may also interact with the following medications: -antibiotics or medicines for infections, especially rifampin, rifabutin, rifapentine, and griseofulvin, and possibly penicillins or tetracyclines -aprepitant -barbiturate medicines, such as phenobarbital -carbamazepine -felbamate -modafinil -oxcarbazepine -phenytoin -ritonavir or other medicines for HIV infection or AIDS -St. John's wort -topiramate This list may not describe all possible interactions. Give your health care provider a list of all the medicines, herbs, non-prescription drugs, or dietary supplements you use. Also tell them if you smoke, drink alcohol, or use illegal drugs. Some items may interact with your medicine. What should I watch for while using this medicine? Visit your doctor or health care professional for regular checks on your progress. You will need a regular breast and pelvic exam and Pap smear while on this medicine. Use an additional method of birth control during the first cycle that you take these tablets. If you have any reason to think you are pregnant, stop taking this medicine right away and contact your doctor or health care professional. If you are taking this medicine for hormone related problems, it   may take several cycles of use to see improvement in your condition. This medicine does not protect you against HIV infection (AIDS)  or any other sexually transmitted diseases. What side effects may I notice from receiving this medicine? Side effects that you should report to your doctor or health care professional as soon as possible: -breast tenderness or discharge -pain in the abdomen, chest, groin or leg -severe headache -skin rash, itching, or hives -sudden shortness of breath -unusually weak or tired -vision or speech problems -yellowing of skin or eyes Side effects that usually do not require medical attention (report to your doctor or health care professional if they continue or are bothersome): -changes in sexual desire -change in menstrual flow -facial hair growth -fluid retention and swelling -headache -irritability -nausea -weight gain or loss This list may not describe all possible side effects. Call your doctor for medical advice about side effects. You may report side effects to FDA at 1-800-FDA-1088. Where should I keep my medicine? Keep out of the reach of children. Store at room temperature between 15 and 30 degrees C (59 and 86 degrees F). Throw away any unused medicine after the expiration date. NOTE: This sheet is a summary. It may not cover all possible information. If you have questions about this medicine, talk to your doctor, pharmacist, or health care provider.  2015, Elsevier/Gold Standard. (2012-05-27 16:41:35)  

## 2014-03-24 LAB — URINE CULTURE
Colony Count: NO GROWTH
Organism ID, Bacteria: NO GROWTH

## 2014-03-26 NOTE — Telephone Encounter (Signed)
Patient wants to reschedule colpo for 03/28/14. States she an canceling because she has a bladder infection and cant have the procedure when she has it. Also wants to know if she even needs it if her pap came back normal.

## 2014-03-26 NOTE — Telephone Encounter (Signed)
Message left to return call to [facility] at [phone].   Hx tubal ligation.

## 2014-03-26 NOTE — Telephone Encounter (Signed)
Spoke with patient. She would like to r/s her colposcopy due to her current UTI.  She states she "just doesn't want to do both" also has concerns regarding finances.  Hx of Tubal ligation. She states she usually gets cycle on the 15th of the month.  Scheduled colposcopy with Dr. Edward JollySilva on 7/31. Discussed while pap smear did not show any malignant cells, results do necessitate further follow up. She is agreeable to this.  pre-procedure instructions given. Motrin instructions given. Motrin=Advil=Ibuprofen Can take 800 mg (Can purchase over the counter, you will need four 200 mg pills). Take with food. Make sure to eat a meal and drink fluids prior to appointment.    Routing to provider for final review. Patient agreeable to disposition. Will close encounter

## 2014-03-28 ENCOUNTER — Ambulatory Visit: Payer: 59 | Admitting: Obstetrics and Gynecology

## 2014-03-28 NOTE — Addendum Note (Signed)
Addended by: Joeseph AmorFAST, Imaad Reuss L on: 03/28/2014 02:52 PM   Modules accepted: Orders

## 2014-04-15 ENCOUNTER — Ambulatory Visit (INDEPENDENT_AMBULATORY_CARE_PROVIDER_SITE_OTHER): Payer: 59 | Admitting: Family Medicine

## 2014-04-15 VITALS — BP 102/60 | HR 87 | Temp 98.5°F | Resp 20 | Ht 69.0 in | Wt 169.0 lb

## 2014-04-15 DIAGNOSIS — R059 Cough, unspecified: Secondary | ICD-10-CM

## 2014-04-15 DIAGNOSIS — R05 Cough: Secondary | ICD-10-CM

## 2014-04-15 DIAGNOSIS — J029 Acute pharyngitis, unspecified: Secondary | ICD-10-CM

## 2014-04-15 DIAGNOSIS — R197 Diarrhea, unspecified: Secondary | ICD-10-CM

## 2014-04-15 DIAGNOSIS — R509 Fever, unspecified: Secondary | ICD-10-CM

## 2014-04-15 LAB — POCT UA - MICROSCOPIC ONLY
Bacteria, U Microscopic: NEGATIVE
Casts, Ur, LPF, POC: NEGATIVE
Crystals, Ur, HPF, POC: NEGATIVE
Mucus, UA: NEGATIVE
Yeast, UA: NEGATIVE

## 2014-04-15 LAB — POCT CBC
Granulocyte percent: 72 %G (ref 37–80)
HCT, POC: 44.3 % (ref 37.7–47.9)
Hemoglobin: 14.8 g/dL (ref 12.2–16.2)
Lymph, poc: 1.8 (ref 0.6–3.4)
MCH, POC: 29.8 pg (ref 27–31.2)
MCHC: 33.4 g/dL (ref 31.8–35.4)
MCV: 89.3 fL (ref 80–97)
MID (cbc): 0.4 (ref 0–0.9)
MPV: 8.4 fL (ref 0–99.8)
POC Granulocyte: 5.7 (ref 2–6.9)
POC LYMPH PERCENT: 23.1 %L (ref 10–50)
POC MID %: 4.9 %M (ref 0–12)
Platelet Count, POC: 254 10*3/uL (ref 142–424)
RBC: 4.96 M/uL (ref 4.04–5.48)
RDW, POC: 14.6 %
WBC: 7.9 10*3/uL (ref 4.6–10.2)

## 2014-04-15 LAB — POCT URINALYSIS DIPSTICK
Bilirubin, UA: NEGATIVE
Glucose, UA: NEGATIVE
Ketones, UA: NEGATIVE
Nitrite, UA: NEGATIVE
Protein, UA: NEGATIVE
Spec Grav, UA: 1.02
Urobilinogen, UA: 0.2
pH, UA: 6.5

## 2014-04-15 LAB — POCT RAPID STREP A (OFFICE): Rapid Strep A Screen: NEGATIVE

## 2014-04-15 MED ORDER — AZITHROMYCIN 250 MG PO TABS: ORAL_TABLET | ORAL | Status: DC

## 2014-04-15 NOTE — Patient Instructions (Signed)
Upper Respiratory Infection, Adult An upper respiratory infection (URI) is also known as the common cold. It is often caused by a type of germ (virus). Colds are easily spread (contagious). You can pass it to others by kissing, coughing, sneezing, or drinking out of the same glass. Usually, you get better in 1 or 2 weeks.  HOME CARE   Only take medicine as told by your doctor.  Use a warm mist humidifier or breathe in steam from a hot shower.  Drink enough water and fluids to keep your pee (urine) clear or pale yellow.  Get plenty of rest.  Return to work when your temperature is back to normal or as told by your doctor. You may use a face mask and wash your hands to stop your cold from spreading. GET HELP RIGHT AWAY IF:   After the first few days, you feel you are getting worse.  You have questions about your medicine.  You have chills, shortness of breath, or brown or red spit (mucus).  You have yellow or brown snot (nasal discharge) or pain in the face, especially when you bend forward.  You have a fever, puffy (swollen) neck, pain when you swallow, or white spots in the back of your throat.  You have a bad headache, ear pain, sinus pain, or chest pain.  You have a high-pitched whistling sound when you breathe in and out (wheezing).  You have a lasting cough or cough up blood.  You have sore muscles or a stiff neck. MAKE SURE YOU:   Understand these instructions.  Will watch your condition.  Will get help right away if you are not doing well or get worse. Document Released: 02/24/2008 Document Revised: 11/30/2011 Document Reviewed: 12/13/2013 ExitCare Patient Information 2015 [employer]. This information is not intended to replace advice given to you by your health care provider. Make sure you discuss any questions you have with your health care provider.  

## 2014-04-15 NOTE — Progress Notes (Signed)
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 She comes in with 1 week of cough, sore throat, diarrhea, and fever. She's been working with the illness but is become progressively more tired and would like some evaluation and treatment.  Patient does smoke. She does not have a history of asthma. She's had no vomiting.  Objective: No acute distress HEENT: Mildly erythematous throat, normal TMs, swollen nasal passages which are pale blue without significant exudates Neck: Supple no adenopathy Chest: Clear Heart: Regular no murmur Abdomen: Soft nontender with active bowel sounds, no guarding, no rebound Skin: No rashes Extremities: No edema, good range of motion and gait   Results for orders placed in visit on 04/15/14  POCT CBC      Result Value Ref Range   WBC 7.9  4.6 - 10.2 K/uL   Lymph, poc 1.8  0.6 - 3.4   POC LYMPH PERCENT 23.1  10 - 50 %L   MID (cbc) 0.4  0 - 0.9   POC MID % 4.9  0 - 12 %M   POC Granulocyte 5.7  2 - 6.9   Granulocyte percent 72.0  37 - 80 %G   RBC 4.96  4.04 - 5.48 M/uL   Hemoglobin 14.8  12.2 - 16.2 g/dL   HCT, POC 09.844.3  11.937.7 - 47.9 %   MCV 89.3  80 - 97 fL   MCH, POC 29.8  27 - 31.2 pg   MCHC 33.4  31.8 - 35.4 g/dL   RDW, POC 14.714.6     Platelet Count, POC 254  142 - 424 K/uL   MPV 8.4  0 - 99.8 fL  POCT UA - MICROSCOPIC ONLY      Result Value Ref Range   WBC, Ur, HPF, POC 2-4     RBC, urine, microscopic 2-3     Bacteria, U Microscopic neg     Mucus, UA neg     Epithelial cells, urine per micros 0-2     Crystals, Ur, HPF, POC neg     Casts, Ur, LPF, POC neg     Yeast, UA neg    POCT URINALYSIS DIPSTICK      Result Value Ref Range   Color, UA yellow     Clarity, UA cloudy     Glucose, UA neg     Bilirubin, UA neg     Ketones, UA neg     Spec Grav, UA 1.020     Blood, UA moderate     pH, UA 6.5     Protein, UA neg     Urobilinogen, UA 0.2     Nitrite, UA neg     Leukocytes, UA Trace    POCT RAPID STREP A (OFFICE)   Result Value Ref Range   Rapid Strep A Screen Negative  Negative   Assessment: Persistent symptoms consistent with a sinus infection or upper respiratory infection  Fever, unspecified - Plan: POCT CBC, POCT UA - Microscopic Only, POCT urinalysis dipstick, Culture, Group A Strep, azithromycin (ZITHROMAX Z-PAK) 250 MG tablet  Sore throat - Plan: POCT rapid strep A, Culture, Group A Strep, azithromycin (ZITHROMAX Z-PAK) 250 MG tablet  Diarrhea  Cough - Plan: POCT CBC, Culture, Group A Strep, azithromycin (ZITHROMAX Z-PAK) 250 MG tablet  Signed, Elvina SidleKurt Mckinzy Fuller, MD

## 2014-04-17 LAB — CULTURE, GROUP A STREP: Organism ID, Bacteria: NORMAL

## 2014-04-18 ENCOUNTER — Telehealth: Payer: Self-pay | Admitting: Obstetrics and Gynecology

## 2014-04-18 NOTE — Telephone Encounter (Signed)
Spoke with patient. Patient states that she started her cycle on 7/16 at which time she began to take micronor. Patient stopped taking the micronor on 7/26 as she states "I have not stopped bleeding from when I started my period." Patient is still currently bleeding and wearing a pad which she changes two times per day. Patient states that bleeding is lighter than before but still happening. Denies abdominal pain, nausea, and fevers. Patient is concerned that she has been bleeding for so long. Patient has appointment for colpo on Friday at 3pm with Dr.Silva. Advised patient that I would speak with Dr.Silva and give patient a call back with further recommendations and instructions. Patient agreeable.

## 2014-04-18 NOTE — Telephone Encounter (Signed)
Spoke with patient. Advised of message as seen below from Dr.Silva. Patient is agreeable and verbalizes understanding. Patient will monitor bleeding. Patient states that bleeding is light at this time. Will keep colpo appointment and call to reschedule if bleeding changes.   Routing to provider for final review. Patient agreeable to disposition. Will close encounter

## 2014-04-18 NOTE — Telephone Encounter (Signed)
Patient has been bleeding for over 15 days. Please call.

## 2014-04-18 NOTE — Telephone Encounter (Signed)
I suspect patient is now having a withdrawal bleed as she has stopped the birth control pills.  If the bleeding is very light, she can continue with the colposcopy.  If she is bleeding like a period, she will need to reschedule. No unprotected intercourse now that she has stopped pills.   We can do further planning at the time of her colposcopy if that is OK.

## 2014-04-20 ENCOUNTER — Encounter: Payer: Self-pay | Admitting: Obstetrics and Gynecology

## 2014-04-20 ENCOUNTER — Ambulatory Visit (INDEPENDENT_AMBULATORY_CARE_PROVIDER_SITE_OTHER): Payer: 59 | Admitting: Obstetrics and Gynecology

## 2014-04-20 VITALS — BP 118/70 | HR 76 | Ht 68.5 in | Wt 173.4 lb

## 2014-04-20 DIAGNOSIS — R141 Gas pain: Secondary | ICD-10-CM

## 2014-04-20 DIAGNOSIS — M25559 Pain in unspecified hip: Secondary | ICD-10-CM

## 2014-04-20 DIAGNOSIS — N943 Premenstrual tension syndrome: Secondary | ICD-10-CM

## 2014-04-20 DIAGNOSIS — R142 Eructation: Secondary | ICD-10-CM

## 2014-04-20 DIAGNOSIS — K5900 Constipation, unspecified: Secondary | ICD-10-CM

## 2014-04-20 DIAGNOSIS — G8929 Other chronic pain: Secondary | ICD-10-CM

## 2014-04-20 DIAGNOSIS — R1013 Epigastric pain: Secondary | ICD-10-CM

## 2014-04-20 DIAGNOSIS — R140 Abdominal distension (gaseous): Secondary | ICD-10-CM

## 2014-04-20 DIAGNOSIS — R87810 Cervical high risk human papillomavirus (HPV) DNA test positive: Secondary | ICD-10-CM

## 2014-04-20 DIAGNOSIS — R143 Flatulence: Secondary | ICD-10-CM

## 2014-04-20 NOTE — Progress Notes (Signed)
Subjective:     Patient ID: Victoria Barr, female   DOB: 1971/09/17, 43 y.o.   MRN: [id number]  HPI  Patient is here today for colposcopy for pap showing normal cells but positive HR HPV. Had abnormal pap in past and colpo.  No treatment.  Lots of questions about HPV.   LMP 04/05/14. Status post BTL.  Not sexually active.  Patient seen for recent visit and started on Ortho Micronor for pelvic cramping.  Micronor chosen as patient is a periodic smoker.  Took the OCPs for only a few days and continued to have menstrual cycle, so she stopped.  Patient had problems with breakthrough bleeding with Depo Provera in the past, and did not want to experience the same. Now having a withdrawal bleed.   Having a lot of abdominal symptoms:  Epigastric pain, constipation, bloating.  History of surgery for volvulus in 1999.  Statues she has pelvic pain during her menstruation.  Feels like something is pulling her ovaries out.   Also has PMS.  Used Zoloft and Prozac in the past for depression and developed hives.   Review of Systems     Objective:   Physical Exam  Genitourinary:           Procedure - Colposcopy with ECC, cervical biopsies and vulvar biopsies. Consent for procedure.  Speculum placed.  Acetic acid placed in vagina.  Findings as noted above.  Acetowhite change extending just inside the cervical canal.  Upper extent difficult to see. ECC performed and sent to path.  Biopsies of acetoshite thickening at 12, 6 and 9 o'clock sent to pathology together.  Monsel's placed.  Minimal EBL.   Acetic acid soaked gauze to the perineum.  Local 1% Lidocaine to the right vulva and the right perianal region. Lot # [id number], Expiration 02/20/15. 3 mm punch biopsies.  AgNO3 placed.  2/0 vicryl suture placed in each location.  Minimal EBL.  No complications.  Assessment:      Positive HR HPV.  12 o'clock exocervix acetowhite change difficult to see extent of area involved into  canal. Dysmenorrhea. Normal pelvic ultrasound.  History of abdominal surgery - volvulus.  PMS.  Allergy to SSRIs. Epigastric pain, constipation, bloating.  Smoker over age 43.   Plan:     Follow up biopsy results.  Refer to Gastroenterology. Discussed St. John's wort as a herbal option to treat depression like symptoms. Declines Mirena IUD.  Return in 10 days for suture removal and follow up. May consider surgical intervention if GI work up is negative and pain persists.  May need to be combined case with general surgery or general surgeon on stand by.   After visit summary to patient.   25 additional minutes discussing HPV, dyspareunia, abdominal symptoms of pain-constipation-bloating, and PMS symptoms.  This visit was essentially two visits - one for colposcopy and then one for all of the other issues.  After visit summary to patient.

## 2014-04-20 NOTE — Patient Instructions (Signed)
St. John's Wort, Hypericum perforatum oral dosage forms What is this medicine? ST. JOHN'S WORT (seynt JONZ wurt) is an herbal or dietary supplement. It is promoted to help improve depressed moods. The FDA has not approved this supplement for any medical use. This supplement may be used for other purposes; ask your health care provider or pharmacist if you have questions. This medicine may be used for other purposes; ask your health care provider or pharmacist if you have questions. What should I tell my health care provider before I take this medicine? They need to know if you have any of these conditions: -heart disease -high blood pressure -immune system problems -kidney disease -liver disease -mental problems like anxiety, depression, mania -stroke -suicidal thoughts or history of attempted suicide -taken an MAOI like Carbex, Eldepryl, Marplan, Nardil, or Parnate in last 14 days -an unusual or allergic reaction to St. John's wort, other herbs, plants, medicines, foods, dyes, or preservatives -pregnant or trying to get pregnant -breast-feeding How should I use this medicine? Take this herb by mouth with a glass of water. Follow the directions on the package labeling, or talk to your health care professional for advice. Take your medicine at regular intervals. Do not take your medicine more often than directed. Contact your pediatrician or health care professional regarding the use of this herb in children. Special care may be needed. Overdosage: If you think you have taken too much of this medicine contact a poison control center or emergency room at once. NOTE: This medicine is only for you. Do not share this medicine with others. What if I miss a dose? If you miss a dose, take it as soon as you can. If it is almost time for your next dose, take only that dose. Do not take double or extra doses. What may interact with this  medicine? -amiodarone -bosentan -cyclosporine -digoxin -female hormones, like estrogens or progestins and birth control pills -irinotecan -linezolid -MAOIs like Carbex, Eldepryl, Marplan, Nardil, and Parnate -medicines for blood pressure or heart disease -medicines for depression, anxiety, or psychotic disturbances -medicines for HIV infection -medicines for sleep during surgery -methylene blue -sirolimus -sunitinib -tacrolimus -theophylline -warfarin This list may not describe all possible interactions. Give your health care provider a list of all the medicines, herbs, non-prescription drugs, or dietary supplements you use. Also tell them if you smoke, drink alcohol, or use illegal drugs. Some items may interact with your medicine. What should I watch for while using this medicine? If you are already being treated for anxiety, depression, or other mental state use this supplement only by your doctor's direction. This supplement my interfere with your other treatments. See your doctor if your symptoms do not get better or if they get worse. This medicine can make you more sensitive to the sun. Keep out of the sun. If you cannot avoid being in the sun, wear protective clothing and use sunscreen. Do not use sun lamps or tanning beds/booths. If you are scheduled for any medical or dental procedure, tell your healthcare provider that you are taking this medicine. You may need to stop taking this supplement before the procedure. Your mouth may get dry. Chewing sugarless gum or sucking hard candy, and drinking plenty of water may help. Contact your doctor if the problem does not go away or is severe. Herbal or dietary supplements are not regulated like medicines. Rigid quality control standards are not required for dietary supplements. The purity and strength of these products can vary. The safety  and effect of this dietary supplement for a certain disease or illness is not well known. This  product is not intended to diagnose, treat, cure or prevent any disease. The Food and Drug Administration suggests the following to help consumers protect themselves: -Always read product labels and follow directions. -Natural does not mean a product is safe for humans to take. -Look for products that include USP after the ingredient name. This means that the manufacturer followed the standards of the [place] Pharmacopoeia. -Supplements made or sold by a nationally known food or drug company are more likely to be made under tight controls. You can write to the company for more information about how the product was made. What side effects may I notice from receiving this medicine? Side effects that you should report to your doctor or health care professional as soon as possible: -allergic reactions like skin rash, itching or hives, swelling of the face, lips, or tongue -anxiety, nervous, racing thoughts -breathing problems -confusion or forgetfulness -fast or irregular heartbeat -suicidal thoughts or other mood changes -unusually weak or tired Minor side effects (report to your doctor or health care professional if they continue or are bothersome): -change in sex drive or performance -difficulty sleeping -stomach upset This list may not describe all possible side effects. Call your doctor for medical advice about side effects. You may report side effects to FDA at 1-800-FDA-1088. Where should I keep my medicine? Keep out of the reach of children. Store at room temperature between 15 and 30 degrees C (59 and 86 degrees F) or as directed on the package label. Protect from moisture. Throw away any unused supplement after the expiration date. NOTE: This sheet is a summary. It may not cover all possible information. If you have questions about this medicine, talk to your doctor, pharmacist, or health care provider.  2015, Elsevier/Gold Standard. (2010-04-17 18:16:08)

## 2014-04-25 LAB — IPS OTHER TISSUE BIOPSY

## 2014-04-26 ENCOUNTER — Telehealth: Payer: Self-pay | Admitting: Obstetrics and Gynecology

## 2014-04-26 NOTE — Telephone Encounter (Signed)
Left message to call Felton Buczynski at [phone].  Offer August 10th at 3pm with Dr.Silva for suture removal.

## 2014-04-26 NOTE — Telephone Encounter (Signed)
Pt needs an appointment to have stitches removed.

## 2014-04-26 NOTE — Telephone Encounter (Signed)
Spoke with patient. Appointment made for August 10th at 3pm with Dr.Silva. Patient agreeable.  Routing to provider for final review. Patient agreeable to disposition. Will close encounter

## 2014-04-30 ENCOUNTER — Encounter: Payer: Self-pay | Admitting: Obstetrics and Gynecology

## 2014-04-30 ENCOUNTER — Ambulatory Visit (INDEPENDENT_AMBULATORY_CARE_PROVIDER_SITE_OTHER): Payer: 59 | Admitting: Obstetrics and Gynecology

## 2014-04-30 VITALS — BP 96/66 | HR 76 | Resp 18 | Ht 68.5 in | Wt 170.0 lb

## 2014-04-30 DIAGNOSIS — IMO0002 Reserved for concepts with insufficient information to code with codable children: Secondary | ICD-10-CM

## 2014-04-30 DIAGNOSIS — F17200 Nicotine dependence, unspecified, uncomplicated: Secondary | ICD-10-CM

## 2014-04-30 DIAGNOSIS — N900 Mild vulvar dysplasia: Secondary | ICD-10-CM

## 2014-04-30 DIAGNOSIS — R6889 Other general symptoms and signs: Secondary | ICD-10-CM

## 2014-04-30 NOTE — Patient Instructions (Signed)
Loop Electrosurgical Excision Procedure Loop electrosurgical excision procedure (LEEP) is the removal of a portion of the lower part of the uterus (cervix). The procedure is done when there are significantly abnormal cervical cell changes. Abnormal cell changes of the cervix can lead to cancer if left in place and untreated.  The LEEP procedure itself typically only takes a few minutes. Often, it may be done in your caregiver's office. The procedure is considered safe for those who wish to get pregnant or are trying to get pregnant. Only under rare circumstances should this procedure be done if you are pregnant. LET YOUR CAREGIVER KNOW ABOUT:  Whether you are pregnant or late for your last menstrual period.  Allergies to foods or medicines.  All the medicines you are taking includingherbs, eyedrops, and over-the-counter medicines, and creams.  Use of steroids (by mouth or creams).  Previous problems with anesthetics or numbing medicine.  Previous gynecological surgery.  History of blood clots or bleeding problems.  Any recent or current vaginal infections (herpes, sexually transmitted infections).  Other health problems. RISKS AND COMPLICATIONS  Bleeding.  Infection.  Injury to the vagina, bladder, or rectum.  Very rare obstruction of the cervical opening that causes problems during menstruation (cervical stenosis). BEFORE THE PROCEDURE  Do not take aspirin or blood thinners (anticoagulants) for 1 week before the procedure, or as told by your caregiver.  Eat a light meal before the procedure.  Ask your caregiver about changing or stopping your regular medicines.  You may be given a pain reliever 1 or 2 hours before the procedure. PROCEDURE   A tool (speculum) is placed in the vagina. This allows your caregiver to see the cervix.  An iodine stain is applied to the cervix to find the area of abnormal cells to be removed.  Medicine is injected to numb the cervix (local  anesthetic).   Electricity is passed through a thin wire loop which is then used to remove (cauterize) a small segment of the affected cervix.  Light electrocautery is used to seal any small blood vessels and prevent bleeding.  A paste may be applied to the cauterized area of the cervix to help prevent bleeding.  The tissue sample is sent to the lab. It is examined under the microscope. AFTER THE PROCEDURE  Have someone drive you home.  You may have slight to moderate cramping.  You may notice a black vaginal discharge from the paste used on the cervix to prevent bleeding. This is normal.  Watch for excessive bleeding. This requires immediate medical care.  Ask when your test results will be ready. Make sure you get your test results. Document Released: 11/28/2002 Document Revised: 11/30/2011 Document Reviewed: 02/17/2011 ExitCare Patient Information 2015 [employer]. This information is not intended to replace advice given to you by your health care provider. Make sure you discuss any questions you have with your health care provider.  

## 2014-04-30 NOTE — Progress Notes (Signed)
GYNECOLOGY  VISIT   HPI: 43 y.o.   Single  Caucasian  female   G1P1001 with Patient's last menstrual period was 04/05/2014.   here for   Suture Removal and pathology results.   FINAL MICROSCOPIC DIAGNOSIS: (A) ENDOCERVIX, CURETTINGS: -MUCIN WITH NUMEROUS INTERMIXED NEUTROPHILS AND RARE GROUPS OF UNREMARKABLE BENIGN SQUAMOUS EPITHELIUM; NO ENDOCERVICAL COMPONENTS IDENTIFIED. (B) CERVIX, BIOPSIES: -LOW GRADE SQUAMOUS INTRAEPITHELIAL LESION (MILD DYSPLASIA-CIN I; HPV EFFECT). -ACUTE AND CHRONIC INFLAMMATION. (C) RIGHT VULVA, BIOPSY: -BENIGN SQUAMOUS MUCOSA SHOWING KOILOCYTOTIC ATYPIA CONSISTENT WITH HPV EFFECT (LSIL-VIN I). (D) RIGHT PERINEAL AREA, BIOPSY: -BENIGN SKIN WITH MINIMAL NONSPECIFIC SUPERFICIAL PATCHY DERMAL CHRONIC INFLAMMATION. -NEGATIVE FOR DYSPLASIA OR ATYPIA.  Used to work for [employer].   GYNECOLOGIC HISTORY: Patient's last menstrual period was 04/05/2014. Contraception:  BTL Menopausal hormone therapy: N/A        OB History   Grav Para Term Preterm Abortions TAB SAB Ect Mult Living   1 1 1       1          Patient Active Problem List   Diagnosis Date Noted  . Chest pain 11/30/2013  . Abnormal EKG 11/30/2013  . Palpitations   . MVP (mitral valve prolapse)   . Acute bronchitis 06/09/2013  . Periodic health assessment, general screening, adult 06/09/2013  . Dysuria 06/09/2013    Past Medical History  Diagnosis Date  . Palpitations   . MVP (mitral valve prolapse)     echo in 2012 did not show MVP but did have mild elongation of the AMVL  . Allergy   . Anxiety   . Arthritis   . Depression   . Hyperlipidemia   . Tachycardia   . Intestinal malrotation   . MVP (mitral valve prolapse)     diagnosed as teen and now has cleared  . Hypertension 05/2013    Past Surgical History  Procedure Laterality Date  . Intestinal malrotation repair  1999  . Tubal ligation    . Appendectomy    . Colon surgery    . Colposcopy  2006    LGSIL, normal since  .  Excision morton's neuroma Bilateral 2002    Current Outpatient Prescriptions  Medication Sig Dispense Refill  . BIOTIN PO Take by mouth daily.      . celecoxib (CELEBREX) 200 MG capsule Take 1 capsule (200 mg total) by mouth daily.  30 capsule  12   No current facility-administered medications for this visit.     ALLERGIES: Imitrex; Penicillins; Prozac; Sulfa antibiotics; and Zoloft  Family History  Problem Relation Age of Onset  . Heart disease Mother   . Hypertension Mother   . Aneurysm Father 36    brain   . Hypertension Sister   . Heart disease Maternal Grandmother   . Cancer Paternal Grandmother   . Cancer Paternal Grandfather     History   Social History  . Marital Status: Single    Spouse Name: N/A    Number of Children: N/A  . Years of Education: N/A   Occupational History  . Not on file.   Social History Main Topics  . Smoking status: Current Some Day Smoker  . Smokeless tobacco: Never Used     Comment: quit 1 year ago, sep 2013  . Alcohol Use: No     Comment: occasionally  . Drug Use: No  . Sexual Activity: Not Currently    Birth Control/ Protection: Surgical     Comment: BTL   Other Topics Concern  .  Not on file   Social History Narrative  . No narrative on file    ROS:  Pertinent items are noted in HPI.  PHYSICAL EXAMINATION:    BP 96/66  Pulse 76  Resp 18  Ht 5' 8.5" (1.74 m)  Wt 170 lb (77.111 kg)  BMI 25.47 kg/m2  LMP 04/05/2014     General appearance: alert, cooperative and appears stated age   Pelvic: External genitalia:  no lesions.  No vulvar suture remaining.                           Anus:  normal sphincter tone, right perianal suture removed.  ASSESSMENT  Unsatisfactory colposcopy.  Normal pap and positive high risk HPV.  LGSIL of cervix.  Absent endocervical cells on ECC.  VIN I of the vulva.  PLAN I discussed LEEP as the standard of care for unsatisfactory colposcopy.   I explained benefits and risks of bleeding,  infection, damage to surrounding organs, cervical scarring making future evaluation more difficult, increased risk of preterm labor and delivery.  I discussed with the patient the need for a future colposcopy to evaluate her vulvar region and the possible need for treatment with surgical or medical therapy.  I strongly recommended smoking cessation.  __15____ minutes face to face time of which over 50% was spent in counseling.   After visit summary to patient.

## 2014-05-02 ENCOUNTER — Encounter: Payer: Self-pay | Admitting: Obstetrics and Gynecology

## 2014-05-02 DIAGNOSIS — IMO0002 Reserved for concepts with insufficient information to code with codable children: Secondary | ICD-10-CM | POA: Insufficient documentation

## 2014-05-02 DIAGNOSIS — F17200 Nicotine dependence, unspecified, uncomplicated: Secondary | ICD-10-CM | POA: Insufficient documentation

## 2014-05-02 DIAGNOSIS — N900 Mild vulvar dysplasia: Secondary | ICD-10-CM | POA: Insufficient documentation

## 2014-05-14 ENCOUNTER — Ambulatory Visit (INDEPENDENT_AMBULATORY_CARE_PROVIDER_SITE_OTHER): Payer: 59 | Admitting: Family Medicine

## 2014-05-14 ENCOUNTER — Encounter: Payer: Self-pay | Admitting: Podiatry

## 2014-05-14 ENCOUNTER — Ambulatory Visit (INDEPENDENT_AMBULATORY_CARE_PROVIDER_SITE_OTHER): Payer: 59 | Admitting: Podiatry

## 2014-05-14 VITALS — BP 120/74 | HR 96 | Temp 98.3°F | Resp 16 | Ht 68.5 in | Wt 168.4 lb

## 2014-05-14 VITALS — BP 117/87 | HR 83 | Resp 16

## 2014-05-14 DIAGNOSIS — N3090 Cystitis, unspecified without hematuria: Secondary | ICD-10-CM

## 2014-05-14 DIAGNOSIS — M722 Plantar fascial fibromatosis: Secondary | ICD-10-CM

## 2014-05-14 DIAGNOSIS — G4700 Insomnia, unspecified: Secondary | ICD-10-CM

## 2014-05-14 DIAGNOSIS — R300 Dysuria: Secondary | ICD-10-CM

## 2014-05-14 DIAGNOSIS — N3946 Mixed incontinence: Secondary | ICD-10-CM

## 2014-05-14 LAB — POCT URINALYSIS DIPSTICK
Bilirubin, UA: NEGATIVE
Glucose, UA: NEGATIVE
Ketones, UA: NEGATIVE
Nitrite, UA: NEGATIVE
Protein, UA: NEGATIVE
Spec Grav, UA: 1.025
Urobilinogen, UA: 0.2
pH, UA: 5.5

## 2014-05-14 LAB — POCT UA - MICROSCOPIC ONLY
Casts, Ur, LPF, POC: NEGATIVE
Crystals, Ur, HPF, POC: NEGATIVE
Mucus, UA: POSITIVE
Yeast, UA: NEGATIVE

## 2014-05-14 MED ORDER — TRAZODONE HCL 50 MG PO TABS: 25.0000 mg | ORAL_TABLET | Freq: Every evening | ORAL | Status: DC | PRN

## 2014-05-14 MED ORDER — NITROFURANTOIN MONOHYD MACRO 100 MG PO CAPS: 100.0000 mg | ORAL_CAPSULE | Freq: Two times a day (BID) | ORAL | Status: DC

## 2014-05-14 MED ORDER — MELOXICAM 15 MG PO TABS: 15.0000 mg | ORAL_TABLET | Freq: Every day | ORAL | Status: DC

## 2014-05-14 MED ORDER — TRIAMCINOLONE ACETONIDE 10 MG/ML IJ SUSP
10.0000 mg | Freq: Once | INTRAMUSCULAR | Status: AC
  Administered 2014-05-14: 10 mg

## 2014-05-14 NOTE — Progress Notes (Signed)
Subjective:    Patient ID: Victoria Barr, female    DOB: March 24, 1971, 43 y.o.   MRN: [id number]  HPI This is a very pleasant 43 yo female who presents today with 3 day history of dysuria, malodorous urine, lower abdominal pain. She has not taken anything for fever/pain relief. She reports a history of 3-4 UTIs a year. She has had problems with incontinence since her son was born 5 years ago. She has to wear pads to protect her clothing. She was seen by [facility] several years ago and states, "they couldn't find anything wrong."  She has had intermittent hematuria for years and denies ever having cystoscopy.   Is having problems with insomnia. She uses melatonin and occasionally diphenhydramine without good relief. She has both difficulty falling asleep and difficulty staying asleep. She has also used amitriptyline, but it made her very groggy in the morning. She is currently experiencing increased stress with her son starting kindergarten and difficulties co parenting with her son's father. 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 She has had multiple health problems lately including plantar fasciitis, vulvar intraepithelial and LGSIL. Her painful feet have made it difficult for her to exercise for stress reduction.   Review of Systems Subjective fever, no chills, back pain yesterday, none today. No vaginal complaints.     Objective:   Physical Exam  Vitals reviewed. Constitutional: She is oriented to person, place, and time. She appears well-developed and well-nourished. No distress.  HENT:  Head: Normocephalic and atraumatic.  Neck: Normal range of motion. Neck supple.  Cardiovascular: Normal rate, regular rhythm and normal heart sounds.   Pulmonary/Chest: Effort normal and breath sounds normal.  Abdominal: Soft. Bowel sounds are normal. She exhibits no distension and no mass. There is no hepatosplenomegaly. There is tenderness in the suprapubic area. There  is no rebound, no guarding and no CVA tenderness.  Musculoskeletal: Normal range of motion.  Neurological: She is alert and oriented to person, place, and time.  Skin: Skin is warm and dry. She is not diaphoretic.  Psychiatric: She has a normal mood and affect. Her behavior is normal. Judgment and thought content normal.   Results for orders placed in visit on 05/14/14  POCT URINALYSIS DIPSTICK      Result Value Ref Range   Color, UA yellow     Clarity, UA hazy     Glucose, UA neg     Bilirubin, UA neg     Ketones, UA neg     Spec Grav, UA 1.025     Blood, UA small     pH, UA 5.5     Protein, UA neg     Urobilinogen, UA 0.2     Nitrite, UA neg     Leukocytes, UA Trace    POCT UA - MICROSCOPIC ONLY      Result Value Ref Range   WBC, Ur, HPF, POC 2-5     RBC, urine, microscopic 2-6     Bacteria, U Microscopic 2+     Mucus, UA positive     Epithelial cells, urine per micros 4-6     Crystals, Ur, HPF, POC neg     Casts, Ur, LPF, POC neg     Yeast, UA neg     Amorphous           Assessment & Plan:  1. Dysuria - POCT urinalysis dipstick - POCT UA - Microscopic Only - Ambulatory referral to  Urogynecology  2. Cystitis - nitrofurantoin, macrocrystal-monohydrate, (MACROBID) 100 MG capsule; Take 1 capsule (100 mg total) by mouth 2 (two) times daily.  Dispense: 20 capsule; Refill: 0 - Urine culture  3. Insomnia - traZODone (DESYREL) 50 MG tablet; Take 0.5-1 tablets (25-50 mg total) by mouth at bedtime as needed for sleep.  Dispense: 30 tablet; Refill: 1 -provided written and verbal instructions regarding recommendation for insomnia.   4. Mixed incontinence - Ambulatory referral to Urogynecology   Emi Belfast, FNP-BC  [facility], [facility]  05/15/2014 8:39 PM

## 2014-05-14 NOTE — Patient Instructions (Signed)
Insomnia  Insomnia is frequent trouble falling and/or staying asleep. Insomnia can be a long term problem or a short term problem. Both are common. Insomnia can be a short term problem when the wakefulness is related to a certain stress or worry. Long term insomnia is often related to ongoing stress during waking hours and/or poor sleeping habits. Overtime, sleep deprivation itself can make the problem worse. Every little thing feels more severe because you are overtired and your ability to cope is decreased.  CAUSES   · Stress, anxiety, and depression.  · Poor sleeping habits.  · Distractions such as TV in the bedroom.  · Naps close to bedtime.  · Engaging in emotionally charged conversations before bed.  · Technical reading before sleep.  · Alcohol and other sedatives. They may make the problem worse. They can hurt normal sleep patterns and normal dream activity.  · Stimulants such as caffeine for several hours prior to bedtime.  · Pain syndromes and shortness of breath can cause insomnia.  · Exercise late at night.  · Changing time zones may cause sleeping problems (jet lag).  It is sometimes helpful to have someone observe your sleeping patterns. They should look for periods of not breathing during the night (sleep apnea). They should also look to see how long those periods last. If you live alone or observers are uncertain, you can also be observed at a sleep clinic where your sleep patterns will be professionally monitored. Sleep apnea requires a checkup and treatment. Give your caregivers your medical history. Give your caregivers observations your family has made about your sleep.   SYMPTOMS   · Not feeling rested in the morning.  · Anxiety and restlessness at bedtime.  · Difficulty falling and staying asleep.  TREATMENT   · Your caregiver may prescribe treatment for an underlying medical disorders. Your caregiver can give advice or help if you are using alcohol or other drugs for self-medication. Treatment  of underlying problems will usually eliminate insomnia problems.  · Medications can be prescribed for short time use. They are generally not recommended for lengthy use.  · Over-the-counter sleep medicines are not recommended for lengthy use. They can be habit forming.  · You can promote easier sleeping by making lifestyle changes such as:  ¨ Using relaxation techniques that help with breathing and reduce muscle tension.  ¨ Exercising earlier in the day.  ¨ Changing your diet and the time of your last meal. No night time snacks.  ¨ Establish a regular time to go to bed.  · Counseling can help with stressful problems and worry.  · Soothing music and white noise may be helpful if there are background noises you cannot remove.  · Stop tedious detailed work at least one hour before bedtime.  HOME CARE INSTRUCTIONS   · Keep a diary. Inform your caregiver about your progress. This includes any medication side effects. See your caregiver regularly. Take note of:  ¨ Times when you are asleep.  ¨ Times when you are awake during the night.  ¨ The quality of your sleep.  ¨ How you feel the next day.  This information will help your caregiver care for you.  · Get out of bed if you are still awake after 15 minutes. Read or do some quiet activity. Keep the lights down. Wait until you feel sleepy and go back to bed.  · Keep regular sleeping and waking hours. Avoid naps.  · Exercise regularly.  · Avoid distractions   at bedtime. Distractions include watching television or engaging in any intense or detailed activity like attempting to balance the household checkbook.  · Develop a bedtime ritual. Keep a familiar routine of bathing, brushing your teeth, climbing into bed at the same time each night, listening to soothing music. Routines increase the success of falling to sleep faster.  · Use relaxation techniques. This can be using breathing and muscle tension release routines. It can also include visualizing peaceful scenes. You can  also help control troubling or intruding thoughts by keeping your mind occupied with boring or repetitive thoughts like the old concept of counting sheep. You can make it more creative like imagining planting one beautiful flower after another in your backyard garden.  · During your day, work to eliminate stress. When this is not possible use some of the previous suggestions to help reduce the anxiety that accompanies stressful situations.  MAKE SURE YOU:   · Understand these instructions.  · Will watch your condition.  · Will get help right away if you are not doing well or get worse.  Document Released: 09/04/2000 Document Revised: 11/30/2011 Document Reviewed: 10/05/2007  ExitCare® Patient Information ©2015 [employer]. This information is not intended to replace advice given to you by your health care provider. Make sure you discuss any questions you have with your health care provider.  Urinary Tract Infection  Urinary tract infections (UTIs) can develop anywhere along your urinary tract. Your urinary tract is your body's drainage system for removing wastes and extra water. Your urinary tract includes two kidneys, two ureters, a bladder, and a urethra. Your kidneys are a pair of bean-shaped organs. Each kidney is about the size of your fist. They are located below your ribs, one on each side of your spine.  CAUSES  Infections are caused by microbes, which are microscopic organisms, including fungi, viruses, and bacteria. These organisms are so small that they can only be seen through a microscope. Bacteria are the microbes that most commonly cause UTIs.  SYMPTOMS   Symptoms of UTIs may vary by age and gender of the patient and by the location of the infection. Symptoms in young women typically include a frequent and intense urge to urinate and a painful, burning feeling in the bladder or urethra during urination. Older women and men are more likely to be tired, shaky, and weak and have muscle aches and  abdominal pain. A fever may mean the infection is in your kidneys. Other symptoms of a kidney infection include pain in your back or sides below the ribs, nausea, and vomiting.  DIAGNOSIS  To diagnose a UTI, your caregiver will ask you about your symptoms. Your caregiver also will ask to provide a urine sample. The urine sample will be tested for bacteria and white blood cells. White blood cells are made by your body to help fight infection.  TREATMENT   Typically, UTIs can be treated with medication. Because most UTIs are caused by a bacterial infection, they usually can be treated with the use of antibiotics. The choice of antibiotic and length of treatment depend on your symptoms and the type of bacteria causing your infection.  HOME CARE INSTRUCTIONS  · If you were prescribed antibiotics, take them exactly as your caregiver instructs you. Finish the medication even if you feel better after you have only taken some of the medication.  · Drink enough water and fluids to keep your urine clear or pale yellow.  · Avoid caffeine, tea, and carbonated   beverages. They tend to irritate your bladder.  · Empty your bladder often. Avoid holding urine for long periods of time.  · Empty your bladder before and after sexual intercourse.  · After a bowel movement, women should cleanse from front to back. Use each tissue only once.  SEEK MEDICAL CARE IF:   · You have back pain.  · You develop a fever.  · Your symptoms do not begin to resolve within 3 days.  SEEK IMMEDIATE MEDICAL CARE IF:   · You have severe back pain or lower abdominal pain.  · You develop chills.  · You have nausea or vomiting.  · You have continued burning or discomfort with urination.  MAKE SURE YOU:   · Understand these instructions.  · Will watch your condition.  · Will get help right away if you are not doing well or get worse.  Document Released: 06/17/2005 Document Revised: 03/08/2012 Document Reviewed: 10/16/2011  ExitCare® Patient Information ©2015  [employer]. This information is not intended to replace advice given to you by your health care provider. Make sure you discuss any questions you have with your health care provider.

## 2014-05-14 NOTE — Patient Instructions (Signed)

## 2014-05-15 NOTE — Progress Notes (Signed)
Subjective:     Patient ID: Victoria Barr, female   DOB: [birth year], 43 y.o.   MRN: [id number]  HPI patient presents stating she knows that she should have been here prior but she felt so good after the injections that she can't cut appointment and then the pain recurred and is quite intense and both heels right being worse than left   Review of Systems     Objective:   Physical Exam Neurovascular status unchanged with patient well oriented x3 and found to have intense discomfort plantar heel right over left with inflammation and fluid   buildup with pain upon palpation to the area right over left Assessment:     Plantar fasciitis of the heel right over left intense nature with reoccurrence and probable mechanical dysfunction creating problem    Plan:     Explained condition to the patient and reinjected the plantar fascia of both heels 3 mg Kenalog 5 mg Xylocaine and then went ahead and scanned for custom orthotics to reduce stress against the arch is and heels and dispensed night splint with instructions on usage along with ice packs. Patient will be seen back to recheck

## 2014-05-16 LAB — URINE CULTURE: Colony Count: 25000

## 2014-05-17 ENCOUNTER — Telehealth: Payer: Self-pay | Admitting: Obstetrics and Gynecology

## 2014-05-17 NOTE — Telephone Encounter (Signed)
Left message for patient to call back. Need to go over benefits for LEEP. °

## 2014-05-22 ENCOUNTER — Other Ambulatory Visit: Payer: Self-pay

## 2014-05-22 DIAGNOSIS — Z1231 Encounter for screening mammogram for malignant neoplasm of breast: Secondary | ICD-10-CM

## 2014-05-30 ENCOUNTER — Encounter (HOSPITAL_COMMUNITY): Payer: Self-pay | Admitting: Emergency Medicine

## 2014-05-30 ENCOUNTER — Emergency Department (INDEPENDENT_AMBULATORY_CARE_PROVIDER_SITE_OTHER)
Admission: EM | Admit: 2014-05-30 | Discharge: 2014-05-30 | Disposition: A | Discharge status: Home / Self Care | Payer: 59 | Source: Home / Self Care

## 2014-05-30 DIAGNOSIS — J029 Acute pharyngitis, unspecified: Secondary | ICD-10-CM

## 2014-05-30 DIAGNOSIS — B9789 Other viral agents as the cause of diseases classified elsewhere: Secondary | ICD-10-CM

## 2014-05-30 DIAGNOSIS — B349 Viral infection, unspecified: Secondary | ICD-10-CM

## 2014-05-30 LAB — POCT RAPID STREP A: Streptococcus, Group A Screen (Direct): NEGATIVE

## 2014-05-30 MED ORDER — TRAMADOL HCL 50 MG PO TABS: 50.0000 mg | ORAL_TABLET | Freq: Four times a day (QID) | ORAL | Status: DC | PRN

## 2014-05-30 NOTE — ED Provider Notes (Signed)
CSN: [id number]     Arrival date & time 05/30/14  1223 History   First MD Initiated Contact with Patient 05/30/14 1335     Chief Complaint  Patient presents with  . Sore Throat  . Generalized Body Aches   (Consider location/radiation/quality/duration/timing/severity/associated sxs/prior Treatment) HPI Comments: 43 year old female developed a sensation of burning in sweating and feeling hot 3 days ago. This is associated with generalized body aches, cough, sore throat, headache, nasal congestion and mild abdominal pain. Denies nausea vomiting or diarrhea. Denies pelvic pain, bleeding or vaginal discharge.   Past Medical History  Diagnosis Date  . Palpitations   . MVP (mitral valve prolapse)     echo in 2012 did not show MVP but did have mild elongation of the AMVL  . Allergy   . Anxiety   . Arthritis   . Depression   . Hyperlipidemia   . Tachycardia   . Intestinal malrotation   . MVP (mitral valve prolapse)     diagnosed as teen and now has cleared  . Hypertension 05/2013   Past Surgical History  Procedure Laterality Date  . Intestinal malrotation repair  1999  . Tubal ligation    . Appendectomy    . Colon surgery    . Colposcopy  2006    LGSIL, normal since  . Excision morton's neuroma Bilateral 2002   Family History  Problem Relation Age of Onset  . Heart disease Mother   . Hypertension Mother   . Aneurysm Father 36    brain   . Hypertension Sister   . Heart disease Maternal Grandmother   . Cancer Paternal Grandmother   . Cancer Paternal Grandfather    History  Substance Use Topics  . Smoking status: Current Some Day Smoker  . Smokeless tobacco: Never Used     Comment: quit 1 year ago, sep 2013  . Alcohol Use: No     Comment: occasionally   OB History   Grav Para Term Preterm Abortions TAB SAB Ect Mult Living   Review of Systems  Constitutional: Positive for activity change. Negative for fever.  HENT: Positive for congestion and sore  throat. Negative for ear discharge, postnasal drip and rhinorrhea.   Eyes: Negative.   Respiratory: Positive for cough. Negative for shortness of breath and wheezing.   Cardiovascular: Negative for chest pain.  Gastrointestinal:       Occasional , mild intermitten pain L mid abdomen. St this is chronic.  Genitourinary: Negative.  Negative for dysuria, urgency, vaginal bleeding, vaginal discharge, vaginal pain and menstrual problem.  Skin: Negative for rash.  Neurological: Positive for headaches. Negative for dizziness, tremors, syncope and speech difficulty.    Allergies  Imitrex; Penicillins; Prozac; Sulfa antibiotics; and Zoloft  Home Medications   Prior to Admission medications   Medication Sig Start Date End Date Taking? Authorizing Provider  BIOTIN PO Take by mouth daily.   Yes Historical Provider, MD  celecoxib (CELEBREX) 200 MG capsule Take 1 capsule (200 mg total) by mouth daily. 12/07/13   Carmelina Dane, MD  meloxicam (MOBIC) 15 MG tablet Take 1 tablet (15 mg total) by mouth daily. 05/14/14   Lenn Sink, DPM  nitrofurantoin, macrocrystal-monohydrate, (MACROBID) 100 MG capsule Take 1 capsule (100 mg total) by mouth 2 (two) times daily. 05/14/14   Emi Belfast, FNP  traMADol (ULTRAM) 50 MG tablet Take 1 tablet (50 mg total) by mouth  every 6 (six) hours as needed. For cough and achiness 05/30/14   Hayden Rasmussen, NP  traZODone (DESYREL) 50 MG tablet Take 0.5-1 tablets (25-50 mg total) by mouth at bedtime as needed for sleep. 05/14/14   Emi Belfast, FNP   BP 99/70  Pulse 89  Temp(Src) 98.1 F (36.7 C) (Oral)  Resp 16  SpO2 100%  LMP 05/22/2014 Physical Exam  Nursing note and vitals reviewed. Constitutional: She is oriented to person, place, and time. She appears well-developed and well-nourished. No distress.  HENT:  Mouth/Throat: No oropharyngeal exudate.  Bilat TM's mildly retracted only OP with minor cobblestoning.  Eyes: Conjunctivae and EOM are normal.   Neck: Normal range of motion. Neck supple.  Cardiovascular: Normal rate, regular rhythm, normal heart sounds and intact distal pulses.   Pulmonary/Chest: Effort normal and breath sounds normal. No respiratory distress. She has no wheezes. She has no rales.  Abdominal: Soft. Bowel sounds are normal. She exhibits no mass. There is no rebound and no guarding.  Most of abdomen percusses tympanic. Generalized tenderness except in RUQ.  Musculoskeletal: She exhibits no edema and no tenderness.  Lymphadenopathy:    She has no cervical adenopathy.  Neurological: She is alert and oriented to person, place, and time. She exhibits normal muscle tone.  Skin: Skin is warm and dry. No rash noted. No erythema.  Psychiatric: She has a normal mood and affect.    ED Course  Procedures (including critical care time) Labs Review Labs Reviewed  POCT RAPID STREP A ([facility]RG CARE ONLY)    Imaging Review No results found.   MDM   1. Viral syndrome   2. Pharyngitis      Rest, plenty of fluids, tramadol for cough and body aches.Ibuprofen prn mucinex.     Hayden Rasmussen, NP 05/30/14 1409

## 2014-05-30 NOTE — ED Provider Notes (Signed)
Medical screening examination/treatment/procedure(s) were performed by a resident physician or non-physician practitioner and as the supervising physician I was immediately available for consultation/collaboration.  Ashly Goethe, MD Family Medicine   Vianne Grieshop J Leslie Jester, MD 05/30/14 2030 

## 2014-05-30 NOTE — Discharge Instructions (Signed)
Pharyngitis Pharyngitis is a sore throat (pharynx). There is redness, pain, and swelling of your throat. HOME CARE   Drink enough fluids to keep your pee (urine) clear or pale yellow.  Only take medicine as told by your doctor.  You may get sick again if you do not take medicine as told. Finish your medicines, even if you start to feel better.  Do not take aspirin.  Rest.  Rinse your mouth (gargle) with salt water ( tsp of salt per 1 qt of water) every 1-2 hours. This will help the pain.  If you are not at risk for choking, you can suck on hard candy or sore throat lozenges. GET HELP IF:  You have large, tender lumps on your neck.  You have a rash.  You cough up green, yellow-brown, or bloody spit. GET HELP RIGHT AWAY IF:   You have a stiff neck.  You drool or cannot swallow liquids.  You throw up (vomit) or are not able to keep medicine or liquids down.  You have very bad pain that does not go away with medicine.  You have problems breathing (not from a stuffy nose). MAKE SURE YOU:   Understand these instructions.  Will watch your condition.  Will get help right away if you are not doing well or get worse. Document Released: 02/24/2008 Document Revised: 06/28/2013 Document Reviewed: 05/15/2013 [facility] Patient Information 2015 [place], [place]. This information is not intended to replace advice given to you by your health care provider. Make sure you discuss any questions you have with your health care provider.  Sore Throat A sore throat is a painful, burning, sore, or scratchy feeling of the throat. There may be pain or tenderness when swallowing or talking. You may have other symptoms with a sore throat. These include coughing, sneezing, fever, or a swollen neck. A sore throat is often the first sign of another sickness. These sicknesses may include a cold, flu, strep throat, or an infection called mono. Most sore throats go away without medical treatment.  HOME  CARE   Only take medicine as told by your doctor.  Drink enough fluids to keep your pee (urine) clear or pale yellow.  Rest as needed.  Try using throat sprays, lozenges, or suck on hard candy (if older than 4 years or as told).  Sip warm liquids, such as broth, herbal tea, or warm water with honey. Try sucking on frozen ice pops or drinking cold liquids.  Rinse the mouth (gargle) with salt water. Mix 1 teaspoon salt with 8 ounces of water.  Do not smoke. Avoid being around others when they are smoking.  Put a humidifier in your bedroom at night to moisten the air. You can also turn on a hot shower and sit in the bathroom for 5-10 minutes. Be sure the bathroom door is closed. GET HELP RIGHT AWAY IF:   You have trouble breathing.  You cannot swallow fluids, soft foods, or your spit (saliva).  You have more puffiness (swelling) in the throat.  Your sore throat does not get better in 7 days.  You feel sick to your stomach (nauseous) and throw up (vomit).  You have a fever or lasting symptoms for more than 2-3 days.  You have a fever and your symptoms suddenly get worse. MAKE SURE YOU:   Understand these instructions.  Will watch your condition.  Will get help right away if you are not doing well or get worse. Document Released: 06/16/2008 Document Revised: 06/01/2012  Document Reviewed: 05/15/2012 [facility] Patient Information 2015 [place], [place]. This information is not intended to replace advice given to you by your health care provider. Make sure you discuss any questions you have with your health care provider.  Viral Infections A viral infection can be caused by different types of viruses.Most viral infections are not serious and resolve on their own. However, some infections may cause severe symptoms and may lead to further complications. SYMPTOMS Viruses can frequently cause:  Minor sore throat.  Aches and pains.  Headaches.  Runny nose.  Different types of  rashes.  Watery eyes.  Tiredness.  Cough.  Loss of appetite.  Gastrointestinal infections, resulting in nausea, vomiting, and diarrhea. These symptoms do not respond to antibiotics because the infection is not caused by bacteria. However, you might catch a bacterial infection following the viral infection. This is sometimes called a "superinfection." Symptoms of such a bacterial infection may include:  Worsening sore throat with pus and difficulty swallowing.  Swollen neck glands.  Chills and a high or persistent fever.  Severe headache.  Tenderness over the sinuses.  Persistent overall ill feeling (malaise), muscle aches, and tiredness (fatigue).  Persistent cough.  Yellow, green, or brown mucus production with coughing. HOME CARE INSTRUCTIONS   Only take over-the-counter or prescription medicines for pain, discomfort, diarrhea, or fever as directed by your caregiver.  Drink enough water and fluids to keep your urine clear or pale yellow. Sports drinks can provide valuable electrolytes, sugars, and hydration.  Get plenty of rest and maintain proper nutrition. Soups and broths with crackers or rice are fine. SEEK IMMEDIATE MEDICAL CARE IF:   You have severe headaches, shortness of breath, chest pain, neck pain, or an unusual rash.  You have uncontrolled vomiting, diarrhea, or you are unable to keep down fluids.  You or your child has an oral temperature above 102 F (38.9 C), not controlled by medicine.  Your baby is older than 3 months with a rectal temperature of 102 F (38.9 C) or higher.  Your baby is 88 months old or younger with a rectal temperature of 100.4 F (38 C) or higher. MAKE SURE YOU:   Understand these instructions.  Will watch your condition.  Will get help right away if you are not doing well or get worse. Document Released: 06/17/2005 Document Revised: 11/30/2011 Document Reviewed: 01/12/2011 [facility] Patient Information 2015 [place], [place].  This information is not intended to replace advice given to you by your health care provider. Make sure you discuss any questions you have with your health care provider.

## 2014-05-30 NOTE — ED Notes (Signed)
C/o sore throat.  Body aches.  Fever. Since Sunday.  Denies n/v/d.  No relief with otc meds.

## 2014-06-01 ENCOUNTER — Ambulatory Visit (INDEPENDENT_AMBULATORY_CARE_PROVIDER_SITE_OTHER): Payer: 59 | Admitting: Family Medicine

## 2014-06-01 ENCOUNTER — Ambulatory Visit: Payer: 59

## 2014-06-01 VITALS — BP 110/66 | HR 74 | Temp 97.9°F | Resp 18 | Ht 68.0 in | Wt 170.0 lb

## 2014-06-01 DIAGNOSIS — J029 Acute pharyngitis, unspecified: Secondary | ICD-10-CM

## 2014-06-01 DIAGNOSIS — M791 Myalgia, unspecified site: Secondary | ICD-10-CM

## 2014-06-01 DIAGNOSIS — IMO0001 Reserved for inherently not codable concepts without codable children: Secondary | ICD-10-CM

## 2014-06-01 DIAGNOSIS — R5381 Other malaise: Secondary | ICD-10-CM

## 2014-06-01 DIAGNOSIS — R599 Enlarged lymph nodes, unspecified: Secondary | ICD-10-CM

## 2014-06-01 DIAGNOSIS — R5383 Other fatigue: Secondary | ICD-10-CM

## 2014-06-01 LAB — POCT CBC
Granulocyte percent: 66.4 %G (ref 37–80)
HCT, POC: 40.8 % (ref 37.7–47.9)
Hemoglobin: 13.2 g/dL (ref 12.2–16.2)
Lymph, poc: 2.1 (ref 0.6–3.4)
MCH, POC: 29.3 pg (ref 27–31.2)
MCHC: 32.2 g/dL (ref 31.8–35.4)
MCV: 90.9 fL (ref 80–97)
MID (cbc): 0.7 (ref 0–0.9)
MPV: 7.7 fL (ref 0–99.8)
POC Granulocyte: 5.6 (ref 2–6.9)
POC LYMPH PERCENT: 25.4 %L (ref 10–50)
POC MID %: 8.2 %M (ref 0–12)
Platelet Count, POC: 224 10*3/uL (ref 142–424)
RBC: 4.49 M/uL (ref 4.04–5.48)
RDW, POC: 13.9 %
WBC: 8.4 10*3/uL (ref 4.6–10.2)

## 2014-06-01 MED ORDER — IBUPROFEN 800 MG PO TABS: 800.0000 mg | ORAL_TABLET | Freq: Three times a day (TID) | ORAL | Status: DC | PRN

## 2014-06-01 MED ORDER — AZITHROMYCIN 250 MG PO TABS: ORAL_TABLET | ORAL | Status: DC

## 2014-06-01 NOTE — Progress Notes (Deleted)
Patient presents today with 1 week history of sore throat. Was seen in Urgent Care and diagnosed with viral syndrome. Wonders if she should be tested for mono. Did have mono as a child. Is achy and has a feeling of having a marble in her neck.  She quit smoking.

## 2014-06-01 NOTE — Progress Notes (Signed)
Subjective:    Patient ID: Victoria Barr, female    DOB: March 14, 1971, 43 y.o.   MRN: [id number]  HPI Patient presents today with 6 day history of sore throat and muscle aches. She was seen at [facility] and diagnosed with viral illness, strep test negative. She continues to feel poorly and has a swollen lymph node on the side of her neck that hurts with movement and swallowing. She has been fatigued lately and feels like she has had one illness after another. Her boss suggested she be checked for mono, but the patient reports having had it when she was 11.   She had complaint of insomnia during her last visit. Her son has started kindergarten and she reports it is going ok. This was a stress for her. She reports she is sleeping better.   Review of Systems - fever, some nasal congestion, - cough, - chest pain, - SOB.    Objective:   Physical Exam  Vitals reviewed. Constitutional: She is oriented to person, place, and time. She appears well-developed and well-nourished.  HENT:  Head: Normocephalic and atraumatic.  Right Ear: Tympanic membrane, external ear and ear canal normal.  Left Ear: External ear and ear canal normal. Tympanic membrane is scarred.  Nose: Nose normal. Right sinus exhibits no maxillary sinus tenderness and no frontal sinus tenderness. Left sinus exhibits no maxillary sinus tenderness and no frontal sinus tenderness.  Mouth/Throat: Uvula is midline and mucous membranes are normal. Posterior oropharyngeal edema and posterior oropharyngeal erythema present. No oropharyngeal exudate or tonsillar abscesses.  Eyes: Conjunctivae are normal.  Neck: Normal range of motion. Neck supple.  Cardiovascular: Normal rate, regular rhythm and normal heart sounds.   Pulmonary/Chest: Effort normal and breath sounds normal.  Musculoskeletal: Normal range of motion.  Lymphadenopathy:    Cervical adenopathy: right anterior cervical node- mobile, tender, approximately 1 cm.  Neurological: She is  alert and oriented to person, place, and time.  Skin: Skin is warm and dry.  Psychiatric: She has a normal mood and affect. Her behavior is normal. Judgment and thought content normal.   Results for orders placed in visit on 06/01/14  POCT CBC      Result Value Ref Range   WBC 8.4  4.6 - 10.2 K/uL   Lymph, poc 2.1  0.6 - 3.4   POC LYMPH PERCENT 25.4  10 - 50 %L   MID (cbc) 0.7  0 - 0.9   POC MID % 8.2  0 - 12 %M   POC Granulocyte 5.6  2 - 6.9   Granulocyte percent 66.4  37 - 80 %G   RBC 4.49  4.04 - 5.48 M/uL   Hemoglobin 13.2  12.2 - 16.2 g/dL   HCT, POC 95.6  21.3 - 47.9 %   MCV 90.9  80 - 97 fL   MCH, POC 29.3  27 - 31.2 pg   MCHC 32.2  31.8 - 35.4 g/dL   RDW, POC 08.6     Platelet Count, POC 224  142 - 424 K/uL   MPV 7.7  0 - 99.8 fL       Assessment & Plan:  1. Other malaise and fatigue - POCT CBC- reassured patient of normal findings  2. Myalgia - POCT CBC -suspect this is viral in origin -Provided written and verbal information regarding diagnosis and treatment. -treat with OTC NSAIDs.   3. Acute pharyngitis, unspecified pharyngitis type -Provided WAS prescription if patient not getting better in next 2 days, can  start. - azithromycin (ZITHROMAX) 250 MG tablet; Take 2 tablets today then 1 tablet every day until finished  Dispense: 6 tablet; Refill: 0  4. Enlarged lymph node -explained that this is probably a response to her current illness and should resolve spontaneously in the next 3-4 weeks. RTC if not resolved in 1 month -encouraged patient to not touch frequently as this will make it more tender.  -Encouraged symptomatic treatment and instructed patient to return if no improvement with above measures in 3-5 days or sooner if she becomes worse.    Emi Belfast, FNP-BC  [facility], [facility]  06/03/2014 5:00 PM

## 2014-06-01 NOTE — Patient Instructions (Addendum)
Take sudafed- 1 tablet in the morning and 1 after lunch as needed for congestion Continue mucinex Drink lots of fluids

## 2014-06-02 LAB — CULTURE, GROUP A STREP

## 2014-06-03 ENCOUNTER — Encounter (HOSPITAL_COMMUNITY): Payer: Self-pay | Admitting: Emergency Medicine

## 2014-06-03 ENCOUNTER — Emergency Department (HOSPITAL_COMMUNITY)
Admission: EM | Admit: 2014-06-03 | Discharge: 2014-06-03 | Discharge status: Against Medical Advice | Payer: 59 | Attending: Emergency Medicine | Admitting: Emergency Medicine

## 2014-06-03 DIAGNOSIS — I10 Essential (primary) hypertension: Secondary | ICD-10-CM | POA: Insufficient documentation

## 2014-06-03 DIAGNOSIS — Z87891 Personal history of nicotine dependence: Secondary | ICD-10-CM | POA: Insufficient documentation

## 2014-06-03 DIAGNOSIS — J029 Acute pharyngitis, unspecified: Secondary | ICD-10-CM | POA: Insufficient documentation

## 2014-06-03 NOTE — ED Notes (Signed)
Pt tired of waiting she decided to leave without being seen

## 2014-06-03 NOTE — ED Notes (Signed)
thge pt has had a sore throat since Sunday.  She was seen by her doctor who did  A strep test that was neg.  She was dxd with a swollen lymph node in her throat.  She is no better she cannot eat or swallow.  lmp sept

## 2014-06-03 NOTE — ED Notes (Signed)
She saw her doctor yesterday.  She is having rt neck pain alsio

## 2014-06-08 ENCOUNTER — Ambulatory Visit (INDEPENDENT_AMBULATORY_CARE_PROVIDER_SITE_OTHER): Payer: 59

## 2014-06-08 DIAGNOSIS — M722 Plantar fascial fibromatosis: Secondary | ICD-10-CM

## 2014-06-08 NOTE — Patient Instructions (Signed)

## 2014-06-08 NOTE — Progress Notes (Signed)
Pt is here to PUO 

## 2014-06-13 NOTE — Telephone Encounter (Signed)
Spoke with patient. Declines appointment offered for today.Declines office visit with Dr Edward Jolly for Friday.    Patient requests tomorrow office visit. Scheduled for 06/14/14 at 1245 with Verner Chol CNM. Patient will return call if symptoms worsen. Advised patient to call back or seek immediate medical care if bleeding worsens or soaking through 1 pad/tampon per hour for two hours or if becomes symptomatic with sob, chest pain, fatigued, lightheaded, or weakness.  Patient verbalized understanding  Patient also needs to schedule LEEP.

## 2014-06-13 NOTE — Telephone Encounter (Signed)
Pt is having abnormal bleeding and wants to talk with nurse.

## 2014-06-13 NOTE — Telephone Encounter (Signed)
Spoke with patient. She had cycle this month that was normal for her, started 9/1 and lasted 7 days.  Patient reports that she has started a cycle again this month on 06/05/14 and has continued through today. Patient wants to know if this is normal. Advised patient that since it is not normal for her, may need evaluation.  Patient hx tubal ligation, approximately 1 year ago.  Patient reports menses like cramps. Changing pad q 3-4 hours.  Advised patient I suggest evaluation with provider.  Patient was to have LEEP after unsatifactory colposcopy on 04/30/14.  Attempted to make appointment for evaluation with Dr. Edward Jolly and patient requests that she return call as she is at work right now and will call back with her schedule.

## 2014-06-13 NOTE — Telephone Encounter (Signed)
Thanks for the update that Victoria Barr will see the patient tomorrow.  I will send a copy of this note to Sara Chu so she is aware of the need for LEEP due to unsatisfactory colposcopy and can relay the importance of follow up.   I understand that the patient is also having urinary issues that I may be able to help with eventually as well.

## 2014-06-14 ENCOUNTER — Encounter: Payer: Self-pay | Admitting: Certified Nurse Midwife

## 2014-06-14 ENCOUNTER — Ambulatory Visit (INDEPENDENT_AMBULATORY_CARE_PROVIDER_SITE_OTHER): Payer: 59 | Admitting: Certified Nurse Midwife

## 2014-06-14 VITALS — BP 114/60 | HR 72 | Resp 12 | Wt 169.0 lb

## 2014-06-14 DIAGNOSIS — R5383 Other fatigue: Secondary | ICD-10-CM

## 2014-06-14 DIAGNOSIS — N921 Excessive and frequent menstruation with irregular cycle: Secondary | ICD-10-CM

## 2014-06-14 DIAGNOSIS — R5381 Other malaise: Secondary | ICD-10-CM

## 2014-06-14 DIAGNOSIS — N920 Excessive and frequent menstruation with regular cycle: Secondary | ICD-10-CM

## 2014-06-14 NOTE — Progress Notes (Addendum)
43 y.o. Married Caucasian female [id number] here with complaint of vaginal bleeding that has continued for 7 days, has been heavy and now is light today. Period was 05/22/14 which was normal 6 days with heavy bleeding and then light bleeding. Patient has never had bleeding except with period. Patient was treated for URI with azithromycin and completed medication and then bleeding started. She also has been with other females who cycling different. Patient very fatigued from URI and 2 periods. Trying to eat better and increase fluids. No other health issues today.  O: Healthy WD,WN female  HGB. 14.5 Affect: normal, orientation Skin: warm and dry Abdomen:soft, nontender, Pelvic exam:EXTERNAL GENITALIA: normal appearing vulva with no masses, tenderness or lesions VAGINA: no abnormal discharge or lesions, scant bleeding noted CERVIX: no lesions or cervical motion tenderness and no bleeding from cervix noted, LEEP appearance UTERUS: normal, non tender ADNEXA: no masses palpable and nontender  A:DUB with normal pelvic exam Abnormal pap history, Leep scheduled   P: Discussed findings of normal pelvic exam and scant bleeding noted at present. Discussed that sometimes medication use or other immune status changes can affect menses along with Thyroid. Patient to work on eating increase protein and fluid intake to help with fatigue. Encouraged to start on oral refrigerated probiotic to help with immune system. Patient to keep menses calendar and report if irregular bleeding occurs again. Stressed importance of LEEP follow up  Labs CBC with DIff, TSH   RV prn

## 2014-06-14 NOTE — Telephone Encounter (Signed)
Patient needs to reschedule her colposcopy.

## 2014-06-14 NOTE — Patient Instructions (Signed)

## 2014-06-15 ENCOUNTER — Telehealth: Payer: Self-pay | Admitting: Emergency Medicine

## 2014-06-15 LAB — CBC WITH DIFFERENTIAL/PLATELET
Basophils Absolute: 0.1 10*3/uL (ref 0.0–0.1)
Basophils Relative: 1 % (ref 0–1)
Eosinophils Absolute: 0.2 10*3/uL (ref 0.0–0.7)
Eosinophils Relative: 4 % (ref 0–5)
HCT: 41.3 % (ref 36.0–46.0)
Hemoglobin: 14 g/dL (ref 12.0–15.0)
Lymphocytes Relative: 32 % (ref 12–46)
Lymphs Abs: 1.8 10*3/uL (ref 0.7–4.0)
MCH: 30.1 pg (ref 26.0–34.0)
MCHC: 33.9 g/dL (ref 30.0–36.0)
MCV: 88.8 fL (ref 78.0–100.0)
Monocytes Absolute: 0.3 10*3/uL (ref 0.1–1.0)
Monocytes Relative: 6 % (ref 3–12)
Neutro Abs: 3.2 10*3/uL (ref 1.7–7.7)
Neutrophils Relative %: 57 % (ref 43–77)
Platelets: 270 10*3/uL (ref 150–400)
RBC: 4.65 MIL/uL (ref 3.87–5.11)
RDW: 13.8 % (ref 11.5–15.5)
WBC: 5.7 10*3/uL (ref 4.0–10.5)

## 2014-06-15 LAB — TSH: TSH: 0.571 u[IU]/mL (ref 0.350–4.500)

## 2014-06-15 NOTE — Telephone Encounter (Signed)
Calling patient to schedule LEEP with Dr. Edward Jolly.  Patient hx tubal ligation, approximately 1 year ago.  Patient was to have LEEP after unsatisfactory colposcopy on 04/30/14.    Patient expresses concern about pain post procedure. She is a single mother and does not have driver.  Also, patient feels that she may not be able to pay for LEEP, Advised of out of pocket expected cost, 156.60 and would be collected at time of procedure.   However, patient wishes to schedule LEEP with Dr Edward Jolly at this time. Requests Friday appointment, specifically, 07/13/14. Patient is scheduled for 07/13/14 at 1500 (60 minute procedure slot). Patient is given pre procedure instructions. Patient   Advised 800 mg of Motrin with food one hour prior to appointment. Motrin=Advil=Ibuprofen Can take 800 mg (Can purchase over the counter, you will need four 200 mg pills). Make sure to eat a meal before appointment and drink plenty of fluids. Patient verbalized understanding and will call to reschedule if will be on menses . Advised will need to cancel within 24 hours or will have $100.00 no show fee placed to account.   Advised patient would have Insurance precert call her back with cost concerns, patient states she is [employer] employee and wondering why cost for LEEP is what it is. Routing to [employer].  Possible payment plan?  Routing patient call to Dr. Edward Jolly for review. Advised patient that I would notify Dr. Edward Jolly of patient's concerns regarding pain control post procedure and that patient would not have driver.

## 2014-06-15 NOTE — Telephone Encounter (Signed)
Please see next telephone encounter in today's date for scheduling information.

## 2014-06-15 NOTE — Progress Notes (Signed)
Reviewed personally.  M. Suzanne Ellagrace Yoshida, MD.  

## 2014-06-16 NOTE — Telephone Encounter (Signed)
Patient will have local injection with lidocaine in her cervix, so I do not expect her to have pain with the procedure or immediately after. She may have cramping later when this wears off.  I would recommend she have someone available that she can call for a ride home if she is not feeling well afterward.

## 2014-06-18 ENCOUNTER — Ambulatory Visit
Admission: RE | Admit: 2014-06-18 | Discharge: 2014-06-18 | Disposition: A | Discharge status: Home / Self Care | Payer: 59 | Source: Ambulatory Visit

## 2014-06-18 DIAGNOSIS — Z1231 Encounter for screening mammogram for malignant neoplasm of breast: Secondary | ICD-10-CM

## 2014-06-18 NOTE — Telephone Encounter (Signed)
This is acceptable. Please have patient sign promissory note for when she will have remainder of balance paid.  Thanks

## 2014-06-18 NOTE — Telephone Encounter (Signed)
PROMISSORY NOTE  This Promissory Note is entered into by:          Madelaine Bhat            of    [address]  [place]  [address], [place] [address]                                                                AND [facility], [address], [place], [place]  [address]  The patient or responsible party agrees to pay the sum of $ 81.60  to [facility].  Payments is to be paid in full on or before August 13, 2014.  Patient and/or responsible party understands that payment must be made regardless of receipt of a statement.  Patient and/or responsible party understands that any time payments are not made as required, account may be turned over to a collection agency with no further notice.  Any and all fees assessed due to collection agency referral will be the responsibility of the patient and/or responsible party.  Patient and/or responsible party understands that any Medicaid benefits are not a permissible form of payment now or in the future.

## 2014-06-18 NOTE — Telephone Encounter (Signed)
Spoke with patient. Explained that per her plan, the LEEP is considered an in-office surgery, therefore it pays 80/20. Total charges are $783.01, patient 20% liability is $156.60. Patient states that she cannot afford to pay this total amount upfront. States that she can pay $75 at the visit and pay the balance within 30 days. I advised that I would ask office administration if this arrangement is acceptable and will call her back.

## 2014-06-18 NOTE — Telephone Encounter (Signed)
I made recommendation for the patient to have the LEEP procedure.  Unsatisfactory colposcopy.  No endocervical cells on the ECC.  Thanks.

## 2014-06-18 NOTE — Telephone Encounter (Signed)
Dr. Edward Jolly,   Can I confirm with you. Should patient have LEEP as scheduled or is to wait until 09/20/14 recall for colpo vs leep?

## 2014-06-18 NOTE — Telephone Encounter (Signed)
Left message for patient to call back  

## 2014-06-22 ENCOUNTER — Ambulatory Visit: Payer: 59 | Admitting: Obstetrics and Gynecology

## 2014-07-09 ENCOUNTER — Telehealth: Payer: Self-pay | Admitting: Obstetrics and Gynecology

## 2014-07-09 NOTE — Telephone Encounter (Signed)
Patient called in to cancel her LEEP with Dr. Edward JollySilva for 07/13/14 as she cannot make it on that day. She will call back to reschedule.

## 2014-07-10 NOTE — Telephone Encounter (Signed)
Routing to Dr. Edward JollySilva for information. Place in recall x 1 month?

## 2014-07-11 NOTE — Telephone Encounter (Signed)
Needs a letter sent regarding the importance of LEEP and follow up.  Send registered and regular.

## 2014-07-13 ENCOUNTER — Ambulatory Visit: Payer: 59 | Admitting: Obstetrics and Gynecology

## 2014-07-13 NOTE — Telephone Encounter (Signed)
Routing to Billie RuddySally Yeakley, Charity fundraiserN, for letter.

## 2014-07-23 ENCOUNTER — Encounter: Payer: Self-pay | Admitting: Certified Nurse Midwife

## 2014-08-09 ENCOUNTER — Encounter: Payer: Self-pay | Admitting: Emergency Medicine

## 2014-08-27 NOTE — Telephone Encounter (Signed)
Routing to provider for final review. Patient agreeable to disposition. Will close encounter.     

## 2014-08-27 NOTE — Telephone Encounter (Signed)
Spoke with patient.  She is willing to schedule but has limited time off.  Not using any contraception.  Patient states she usually starts her cycles around the 14th or 15th. But sometimes skips cycles or it comes early. Has time off on 09/07/14.  Scheduled LEEP for 09/07/14. Advised patient to please call either way, on the first day of her cycle when it starts, or call prior to appointment if has not started cycle yet. Patient agreeable to instructions. Advised LEEP must be done by cycle day 12. Advised very important to have procedure completed. Patient verbalizes understanding and will call back for further instructions.

## 2014-08-30 ENCOUNTER — Telehealth: Payer: Self-pay | Admitting: Family Medicine

## 2014-08-30 NOTE — Telephone Encounter (Signed)
Left a message for patient to return call and update us on status of flu shot.

## 2014-09-03 ENCOUNTER — Ambulatory Visit: Payer: 59 | Admitting: Podiatry

## 2014-09-04 ENCOUNTER — Telehealth: Payer: Self-pay | Admitting: Obstetrics and Gynecology

## 2014-09-04 NOTE — Telephone Encounter (Signed)
Pt cancelled leep procedure for Friday because her period came on Monday. Please call to reschedule.

## 2014-09-04 NOTE — Telephone Encounter (Signed)
Return call to patient. States menses started 09/03/2014 and generally lasts a full-day. Offered appointment for procedure on 09/10/2014 and patient declines due to childcare and work issues. Patient request to reschedule procedure in January 2016 after next cycle. Discussed that procedure has been delayed several months and although we will schedule in January, she should know it is our recommendation that she proceed as soon as possible. Advised this is a procedure to remove abnormal cells and cannot ensure there has not been progression of disease. Next menses is due approximately January 14 per patient.  She request Friday appointment due to work schedule and childcare issues.  Appointment scheduled for 10/12/2014 with Dr. Hyacinth MeekerMiller in order to accommodate patient schedule request.  (Dr Edward JollySilva will be out of office during this week) patient is agreeable to provider change in order to have requested date. Advised will need to review this with both providers. Also advised will need to call back with onset of next cycle to confirm this date will work.  Dr Edward JollySilva, please review and advise then I will review with Dr Hyacinth MeekerMiller if needed.

## 2014-09-05 NOTE — Telephone Encounter (Signed)
Please schedule an appointment with me for a pap smear.  Patient has had a significant delay in proceeding with LEEP procedure.  I would like to re-evaluate before proceeding with the LEEP.

## 2014-09-06 ENCOUNTER — Telehealth: Payer: Self-pay | Admitting: *Deleted

## 2014-09-06 NOTE — Telephone Encounter (Signed)
See previous encounter, opened in error attempting to complete previous encounter. Has been handled.  Routing to provider for final review. Patient agreeable to disposition. Will close encounter

## 2014-09-06 NOTE — Telephone Encounter (Signed)
Spoke with patient. She is a [employer] employee and has already requested 10/12/14 off and must give two weeks advance notice to take time off of work.  Advised of message from Dr. Edward JollySilva. Patient understands importance of treatment, but needs to keep same day appointment. Advised would change appointment to pap only appointment with Dr. Hyacinth MeekerMiller.  Routing to Dr. Hyacinth MeekerMiller and Dr. Edward JollySilva.  Appointment day and time kept same.

## 2014-09-06 NOTE — Telephone Encounter (Signed)
Thank you for the update.  LEEP was originally ordered because of an area on the cervix that looked like it was just extending into the endometrial canal and I could not visualize the entire area. She has delayed in her care for the LEEP, so that I believe it is reasonable to re-evaluate with pap/HR HPV cotesting.  I would like to review her pap when it returns.   Cc- Dr. Hyacinth MeekerMiller

## 2014-09-07 ENCOUNTER — Ambulatory Visit: Payer: 59 | Admitting: Obstetrics and Gynecology

## 2014-09-10 ENCOUNTER — Ambulatory Visit (INDEPENDENT_AMBULATORY_CARE_PROVIDER_SITE_OTHER): Payer: 59

## 2014-09-10 ENCOUNTER — Ambulatory Visit (INDEPENDENT_AMBULATORY_CARE_PROVIDER_SITE_OTHER): Payer: 59 | Admitting: Podiatry

## 2014-09-10 VITALS — BP 112/81 | HR 84 | Resp 16

## 2014-09-10 DIAGNOSIS — M722 Plantar fascial fibromatosis: Secondary | ICD-10-CM

## 2014-09-10 DIAGNOSIS — M779 Enthesopathy, unspecified: Secondary | ICD-10-CM

## 2014-09-10 MED ORDER — TRIAMCINOLONE ACETONIDE 10 MG/ML IJ SUSP
10.0000 mg | Freq: Once | INTRAMUSCULAR | Status: AC
  Administered 2014-09-10: 10 mg

## 2014-09-12 NOTE — Progress Notes (Signed)
Subjective:     Patient ID: Victoria Barr, female   DOB: July 23, 1971, 43 y.o.   MRN: [id number]  HPI patient presents stating I'm still getting pain in the plantar aspect of my heel with some improvement noted but still very sore when I first thing   Review of Systems     Objective:   Physical Exam Neurovascular status intact with continued discomfort plantar aspect heel at the insertion of the tendon into the calcaneus    Assessment:     Continue plantar fasciitis    Plan:     Reinjected the plantar fascia 3 mg Kenalog 5 g Xylocaine Marcaine and instructed on splint usage and orthotics and reappoint as needed

## 2014-10-01 ENCOUNTER — Ambulatory Visit: Payer: 59 | Admitting: Podiatry

## 2014-10-12 ENCOUNTER — Ambulatory Visit: Payer: 59 | Admitting: Obstetrics & Gynecology

## 2014-10-14 ENCOUNTER — Telehealth: Payer: Self-pay | Admitting: *Deleted

## 2014-10-14 NOTE — Telephone Encounter (Signed)
Telephone cal to patient to adjust appointment time due to weather related delay.Patient has multiple scheduling issues and this appointment has been rescheduled multiple times. See previous phone notes.Due to work and child care issues, needs early am appointment. Scheduled appointment for 10/19/14 at 0815 due to need to get pap and co-testing done. Patient is aware this is with Dr Hyacinth MeekerMiller (Dr Edward JollySilva is out of the office) and that it is a work-in appointment that will need to be approved by provider. Will call her back if this does not work.  Will this appointment work for you?

## 2014-10-15 ENCOUNTER — Ambulatory Visit: Payer: 59 | Admitting: Obstetrics & Gynecology

## 2014-10-15 NOTE — Telephone Encounter (Signed)
Yes.  Absolutely.  Shouldn't have am weather issues by then.  Thanks.  Once pt aware, ok to close encounter.

## 2014-10-15 NOTE — Telephone Encounter (Signed)
Patient notified that Dr Hyacinth MeekerMiller agreeable to Friday 10-19-14 at 0815. Patient appreciative. Encounter closed.

## 2014-10-18 ENCOUNTER — Encounter: Payer: Self-pay | Admitting: Cardiology

## 2014-10-19 ENCOUNTER — Ambulatory Visit (INDEPENDENT_AMBULATORY_CARE_PROVIDER_SITE_OTHER): Payer: 59 | Admitting: Obstetrics & Gynecology

## 2014-10-19 VITALS — BP 102/72 | HR 60 | Resp 16 | Wt 165.8 lb

## 2014-10-19 DIAGNOSIS — N870 Mild cervical dysplasia: Secondary | ICD-10-CM

## 2014-10-21 ENCOUNTER — Encounter: Payer: Self-pay | Admitting: Obstetrics & Gynecology

## 2014-10-21 NOTE — Progress Notes (Signed)
Subjective:     Patient ID: Victoria Barr, female   DOB: 12-22-1970, 44 y.o.   MRN: [id number]  HPI 44 yo here for repeat pap.  Pt had pap obtained 02/28/14 showing normal cells but + HR HPV.  Colposcopy was performed 04/20/14 showing CIN1 but lesion extended into endocervical canal.  Follow-up colposcopy vs LEEP recommended.  Pt considered LEEP but as has been six months since procedure, she is here for repeat Pap/HR HPV first.  No bleeding with intercourse.  Denies vaginal discharge.  Pt reports she is had a little wheezing this morning.  Feels it has resolved but would like me to listen to see if anything needs to be done.  She is a smoker.  Pt denies cough, sinus issues, fever, SOB, palpitations.  Review of Systems  All other systems reviewed and are negative.      Objective:   Physical Exam  Constitutional: She appears well-developed and well-nourished.  Neck: Normal range of motion. Neck supple.  Cardiovascular: Normal rate, regular rhythm and normal heart sounds.   Pulmonary/Chest: Effort normal and breath sounds normal. No respiratory distress. She has no wheezes.  Genitourinary: Vagina normal and uterus normal. There is no rash, tenderness, lesion or injury on the right labia. There is no rash, tenderness, lesion or injury on the left labia. Cervix exhibits no motion tenderness and no discharge.  Lymphadenopathy:       Right: No inguinal adenopathy present.       Left: No inguinal adenopathy present.       Assessment:     H/O CIN1 with lesion extension into endocervical canal  Pt felt wheezing this am but negative exam today Smoker    Plan:     Pap with HR HPV obtained today Smoking cessation discussed

## 2014-10-25 LAB — IPS PAP TEST WITH HPV

## 2014-11-01 ENCOUNTER — Other Ambulatory Visit: Payer: Self-pay | Admitting: *Deleted

## 2014-11-01 ENCOUNTER — Telehealth: Payer: Self-pay | Admitting: *Deleted

## 2014-11-01 DIAGNOSIS — R87810 Cervical high risk human papillomavirus (HPV) DNA test positive: Principal | ICD-10-CM

## 2014-11-01 DIAGNOSIS — R87610 Atypical squamous cells of undetermined significance on cytologic smear of cervix (ASC-US): Secondary | ICD-10-CM

## 2014-11-01 NOTE — Telephone Encounter (Signed)
Call to patient. States she has received My Chart message and is agreeable to proceed with procedure. Denies questions, states she has read about it and cant really talk well while at work. Prefers to just schedule procedure. Menses due approximately 11-21-14, history of BTSP. 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 LEEP scheduled for 11-28-14 at 3pm. She will advise employer she needs to be on desk assignment the following day. Instructed to take Motrin 800 mg one hour prior with food.  Patient denies symptoms of yeast and declines treatment at this time. Will call back if symptoms develop.  New 06 recall for 12-04-14 entered until procedure completed.   Encounter closed.

## 2014-11-01 NOTE — Telephone Encounter (Signed)
-----   Message from Annamaria BootsMary Suzanne Miller, MD sent at 10/30/2014  5:31 PM EST ----- Please inform pt that pap is ASCUS with + HR HPV.  Last pap was normal but had just + HR HPV.  This is a change.  I think needs to proceed with LEEP as planned with Dr. Edward JollySilva.  Have discussed with Dr. Edward JollySilva as well.  Out of current recall.  I did send the following message to pt as well.  Yeast was on pap as well.  If she has any symptoms, ok to treat with Diflucan 150mg  po x 1, repeat 48 hours.  Pt note: Ms. Mollie GermanyObenshine, Your Pap now showed ASCUS as well as the high risk HPV.  This is worse than the last Pap smear you had.  I feel you should proceed with the LEEP as recommended by Dr. Edward JollySilva.  I have spoken to her about this and she is in agreement.  One of my nurses should call you about this to see if you have any additional questions/concerns.  Thanks.  Dr. Hyacinth MeekerMiller

## 2014-11-13 ENCOUNTER — Ambulatory Visit (INDEPENDENT_AMBULATORY_CARE_PROVIDER_SITE_OTHER): Payer: 59 | Admitting: Cardiology

## 2014-11-13 ENCOUNTER — Encounter: Payer: Self-pay | Admitting: Cardiology

## 2014-11-13 VITALS — BP 110/70 | HR 79 | Ht 68.0 in | Wt 166.0 lb

## 2014-11-13 DIAGNOSIS — R079 Chest pain, unspecified: Secondary | ICD-10-CM

## 2014-11-13 DIAGNOSIS — M79604 Pain in right leg: Secondary | ICD-10-CM

## 2014-11-13 DIAGNOSIS — M79606 Pain in leg, unspecified: Secondary | ICD-10-CM

## 2014-11-13 DIAGNOSIS — R002 Palpitations: Secondary | ICD-10-CM

## 2014-11-13 DIAGNOSIS — I341 Nonrheumatic mitral (valve) prolapse: Secondary | ICD-10-CM

## 2014-11-13 DIAGNOSIS — M79605 Pain in left leg: Secondary | ICD-10-CM

## 2014-11-13 NOTE — Addendum Note (Signed)
Addended by: Gunnar FusiKEMP, KATHRYN A on: 11/13/2014 09:37 AM   Modules accepted: Orders

## 2014-11-13 NOTE — Progress Notes (Signed)
Cardiology Office Note   Date:  11/13/2014   ID:  ROGER FASNACHT, DOB 07-17-1971, MRN [id number]  PCP:  Carmelina Dane, MD  Cardiologist:   Quintella Reichert, MD   Chief Complaint  Patient presents with  . Leg Pain      History of Present Illness: Victoria Barr is a 44 y.o. female with a history of MV prolapse diagnosed as a teenager but echo in 2012 with no MVP but flat coaptation of the AMVL with mild eccentric MR, palpitations with sinus tachycardia and atypical CP with echo a 11/2013 showing normal LVF with trivial MR.  She presents today for followup.  She has been having severe stabbing sharp pains in her Lower extremities.  These pains start in her feet and move up to her groin area.  They are sharp and she can feel her pulse very hard in her feet.  Her thighs ache to the point that she is unable to stand in clinic anymore.  The pain occurs throughout the day and nothing makes it better.  She has seen an orthopedist and had an MRI which was unremarkable.  She denies any fever or chills, she is on no meds and has not had any recent viral syndrome.  She has no family history of clotting disorders.   She wakes up 4 times nightly for unknown reasons but goes right back to sleep.  She sleeps with her son but is moving to a new apartment and he will have his own room.  She does not think she snores.  She gets 8 hours of sleep but is still tired in the am.    Past Medical History  Diagnosis Date  . Palpitations   . MVP (mitral valve prolapse)     echo in 2012 did not show MVP but did have mild elongation of the AMVL  . Allergy   . Anxiety   . Arthritis   . Depression   . Hyperlipidemia   . Tachycardia   . Intestinal malrotation   . MVP (mitral valve prolapse)     diagnosed as teen and now has cleared  . Hypertension 05/2013    Past Surgical History  Procedure Laterality Date  . Intestinal malrotation repair  1999  . Tubal ligation    . Appendectomy    . Colon surgery     . Colposcopy  2006    LGSIL, normal since  . Excision morton's neuroma Bilateral 2002     No current outpatient prescriptions on file.   No current facility-administered medications for this visit.    Allergies:   Imitrex; Penicillins; Prozac; Sulfa antibiotics; and Zoloft    Social History:  The patient  reports that she has quit smoking. She has never used smokeless tobacco. She reports that she does not drink alcohol or use illicit drugs.   Family History:  The patient's family history includes Aneurysm (age of onset: 23) in her father; Cancer in her paternal grandfather and paternal grandmother; Heart disease in her maternal grandmother and mother; Hypertension in her mother and sister.    ROS:  Please see the history of present illness.   Otherwise, review of systems are positive for none.   All other systems are reviewed and negative.    PHYSICAL EXAM: VS:  BP 110/70 mmHg  Pulse 79  Ht  (1.727 m)  Wt 166 lb (75.297 kg)  BMI 25.25 kg/m2  LMP 10/23/2014 , BMI Body mass index is 25.25 kg/(m^2).  GEN: Well nourished, well developed, in no acute distress HEENT: normal Neck: no JVD, carotid bruits, or masses Cardiac: RRR; no murmurs, rubs, or gallops,no edema 1+ pedal pulses Respiratory:  clear to auscultation bilaterally, normal work of breathing GI: soft, nontender, nondistended, + BS MS: no deformity or atrophy Skin: warm and dry, no rash Neuro:  Strength and sensation are intact Psych: euthymic mood, full affect   EKG:  EKG was ordered today and showed NSR with LAE rSR' in V1 and V2    Recent Labs: 11/30/2013: BUN 16; Creatinine 0.80; Potassium 4.0; Sodium 138 06/14/2014: Hemoglobin 14.0; Platelets 270; TSH 0.571    Lipid Panel No results found for: CHOL, TRIG, HDL, CHOLHDL, VLDL, LDLCALC, LDLDIRECT    Wt Readings from Last 3 Encounters:  11/13/14 166 lb (75.297 kg)  10/19/14 165 lb 12.8 oz (75.206 kg)  06/14/14 169 lb (76.658 kg)        ASSESSMENT AND PLAN:  1. Atypical chest pain - resolved 2. Palpitations resolved 3. Severe bilateral leg pain. Her pulses in her LE are 1+ and not bounding.  There is no LE edema or palpable cords.  Unclear of the etiology of the pain.  ? Whether this is neuropathic pain or primary muscular etiology.  I will check a CPK, ANA and RF.  I will get LE venous and arterial dopplers.  Check CMP, CBC and TSH.  If workup is normal then will refer to Neurology for evaluation. 4. Excessive daytime sleepiness.  She wakes up 4 times nightly for unknown reasons but goes right back to sleep.  She sleeps with her son but is moving to a new apartment and he will have his own room.  She does not think she snores.  She gets 8 hours of sleep but is still tired in the am.  I have suggested that we hold off on sleep study until she moves her son into another bedroom and see if her sleeping improves.    Current medicines are reviewed at length with the patient today.  The patient does not have concerns regarding medicines.  The following changes have been made:  no change  Labs/ tests ordered today include: TSH, CMP, CBC, RF, ANA, CPK, LE venous and arterial dopplers     Disposition:   FU with me in 1 year   Signed, Quintella ReichertURNER,Amaad Byers R, MD  11/13/2014 9:24 AM    [facility] [address], [place], [place]  [address] Phone: [phone]; Fax: [phone]

## 2014-11-13 NOTE — Patient Instructions (Addendum)
Your physician recommends that you continue on your current medications as directed. Please refer to the Current Medication list given to you today.  Your physician recommends that you have FASTING  lab work TOMORROW (BMET, CBC, TSH, LFTs, lipids, sed rate, ANA, CPK, RF).   Dr. Turner recommends you haMayford Knifeve bilateral lower extremity venous dopplers.  Dr. Mayford Knifeurner recommends you have bilateral lower extremity arterial dopplers.   Your physician wants you to follow-up in: 1 year with Dr. Mayford Knifeurner. You will receive a reminder letter in the mail two months in advance. If you don't receive a letter, please call our office to schedule the follow-up appointment.

## 2014-11-14 ENCOUNTER — Other Ambulatory Visit (INDEPENDENT_AMBULATORY_CARE_PROVIDER_SITE_OTHER): Payer: 59 | Admitting: *Deleted

## 2014-11-14 DIAGNOSIS — R002 Palpitations: Secondary | ICD-10-CM

## 2014-11-14 DIAGNOSIS — I341 Nonrheumatic mitral (valve) prolapse: Secondary | ICD-10-CM

## 2014-11-14 DIAGNOSIS — M79605 Pain in left leg: Secondary | ICD-10-CM

## 2014-11-14 DIAGNOSIS — R079 Chest pain, unspecified: Secondary | ICD-10-CM

## 2014-11-14 DIAGNOSIS — M79604 Pain in right leg: Secondary | ICD-10-CM

## 2014-11-14 LAB — CBC WITH DIFFERENTIAL/PLATELET
Basophils Absolute: 0 10*3/uL (ref 0.0–0.1)
Basophils Relative: 0.5 % (ref 0.0–3.0)
Eosinophils Absolute: 0.3 10*3/uL (ref 0.0–0.7)
Eosinophils Relative: 4.2 % (ref 0.0–5.0)
HCT: 42.3 % (ref 36.0–46.0)
Hemoglobin: 14.2 g/dL (ref 12.0–15.0)
Lymphocytes Relative: 27.3 % (ref 12.0–46.0)
Lymphs Abs: 1.8 10*3/uL (ref 0.7–4.0)
MCHC: 33.7 g/dL (ref 30.0–36.0)
MCV: 89 fl (ref 78.0–100.0)
Monocytes Absolute: 0.4 10*3/uL (ref 0.1–1.0)
Monocytes Relative: 6.5 % (ref 3.0–12.0)
Neutro Abs: 4.1 10*3/uL (ref 1.4–7.7)
Neutrophils Relative %: 61.5 % (ref 43.0–77.0)
Platelets: 228 10*3/uL (ref 150.0–400.0)
RBC: 4.75 Mil/uL (ref 3.87–5.11)
RDW: 13.7 % (ref 11.5–15.5)
WBC: 6.7 10*3/uL (ref 4.0–10.5)

## 2014-11-14 LAB — CK TOTAL AND CKMB (NOT AT ARMC): Total CK: 46 U/L (ref 7–177)

## 2014-11-14 LAB — BASIC METABOLIC PANEL
BUN: 13 mg/dL (ref 6–23)
CO2: 30 mEq/L (ref 19–32)
Calcium: 9.6 mg/dL (ref 8.4–10.5)
Chloride: 105 mEq/L (ref 96–112)
Creatinine, Ser: 0.77 mg/dL (ref 0.40–1.20)
GFR: 87.64 mL/min (ref >60.00)
Glucose, Bld: 95 mg/dL (ref 70–99)
Potassium: 4.3 mEq/L (ref 3.5–5.1)
Sodium: 138 mEq/L (ref 135–145)

## 2014-11-14 LAB — HEPATIC FUNCTION PANEL
ALT: 19 U/L (ref 0–35)
AST: 26 U/L (ref 0–37)
Albumin: 4.1 g/dL (ref 3.5–5.2)
Alkaline Phosphatase: 84 U/L (ref 39–117)
Bilirubin, Direct: 0.1 mg/dL (ref 0.0–0.3)
Total Bilirubin: 0.4 mg/dL (ref 0.2–1.2)
Total Protein: 7.1 g/dL (ref 6.0–8.3)

## 2014-11-14 LAB — RHEUMATOID FACTOR: Rhuematoid fact SerPl-aCnc: <10 IU/mL (ref <=14)

## 2014-11-14 LAB — LIPID PANEL
Cholesterol: 221 mg/dL — ABNORMAL HIGH (ref 0–200)
HDL: 76.4 mg/dL (ref >39.00)
LDL Cholesterol: 125 mg/dL — ABNORMAL HIGH (ref 0–99)
NonHDL: 144.6
Total CHOL/HDL Ratio: 3
Triglycerides: 99 mg/dL (ref 0.0–149.0)
VLDL: 19.8 mg/dL (ref 0.0–40.0)

## 2014-11-14 LAB — SEDIMENTATION RATE: Sed Rate: 16 mm/hr (ref 0–22)

## 2014-11-14 LAB — TSH: TSH: 0.88 u[IU]/mL (ref 0.35–4.50)

## 2014-11-14 NOTE — Addendum Note (Signed)
Addended by: Tonita PhoenixBOWDEN, ROBIN K on: 11/14/2014 07:54 AM   Modules accepted: Orders

## 2014-11-15 ENCOUNTER — Other Ambulatory Visit (HOSPITAL_COMMUNITY): Payer: Self-pay | Admitting: Cardiology

## 2014-11-15 DIAGNOSIS — I739 Peripheral vascular disease, unspecified: Secondary | ICD-10-CM

## 2014-11-15 LAB — ANA: Anti Nuclear Antibody(ANA): NEGATIVE

## 2014-11-16 ENCOUNTER — Telehealth: Payer: Self-pay | Admitting: Cardiology

## 2014-11-16 DIAGNOSIS — M79604 Pain in right leg: Secondary | ICD-10-CM

## 2014-11-16 DIAGNOSIS — F17200 Nicotine dependence, unspecified, uncomplicated: Secondary | ICD-10-CM

## 2014-11-16 DIAGNOSIS — M79605 Pain in left leg: Secondary | ICD-10-CM

## 2014-11-16 NOTE — Telephone Encounter (Signed)
New Message        Calling stating that they did not receive frozen to do CKMB portion of order. Calling to notify Dr. Mayford Knifeurner. Please call back.

## 2014-11-16 NOTE — Telephone Encounter (Signed)
pt notified about lab results with verbal understanding to results given . Pt aware referral to Neuro per Dr. Mayford Knifeurner for possible neuropathy. Pt agreeable to Plan of care.

## 2014-11-20 ENCOUNTER — Ambulatory Visit (HOSPITAL_COMMUNITY): Payer: 59 | Attending: Cardiology | Admitting: *Deleted

## 2014-11-20 DIAGNOSIS — M79604 Pain in right leg: Secondary | ICD-10-CM | POA: Diagnosis not present

## 2014-11-20 DIAGNOSIS — M79605 Pain in left leg: Secondary | ICD-10-CM | POA: Insufficient documentation

## 2014-11-20 DIAGNOSIS — M79606 Pain in leg, unspecified: Secondary | ICD-10-CM

## 2014-11-20 NOTE — Progress Notes (Signed)
Bilateral Lower Extremity Venous Duplex Exam Performed - Negative for DVT 

## 2014-11-21 ENCOUNTER — Ambulatory Visit (HOSPITAL_COMMUNITY): Payer: 59 | Attending: Emergency Medicine | Admitting: *Deleted

## 2014-11-21 DIAGNOSIS — M79604 Pain in right leg: Secondary | ICD-10-CM | POA: Insufficient documentation

## 2014-11-21 DIAGNOSIS — M79605 Pain in left leg: Secondary | ICD-10-CM | POA: Insufficient documentation

## 2014-11-21 DIAGNOSIS — Z87891 Personal history of nicotine dependence: Secondary | ICD-10-CM | POA: Insufficient documentation

## 2014-11-21 DIAGNOSIS — E785 Hyperlipidemia, unspecified: Secondary | ICD-10-CM | POA: Insufficient documentation

## 2014-11-21 DIAGNOSIS — I739 Peripheral vascular disease, unspecified: Secondary | ICD-10-CM

## 2014-11-21 DIAGNOSIS — I10 Essential (primary) hypertension: Secondary | ICD-10-CM | POA: Insufficient documentation

## 2014-11-21 DIAGNOSIS — M79606 Pain in leg, unspecified: Secondary | ICD-10-CM

## 2014-11-21 NOTE — Progress Notes (Signed)
Lower Extremity Arterial Doppler Performed 

## 2014-11-23 ENCOUNTER — Telehealth: Payer: Self-pay | Admitting: Obstetrics and Gynecology

## 2014-11-23 NOTE — Telephone Encounter (Signed)
Spoke with patient. She is advised to call back on 11/27/14 (day before procedure) if she is having any bleeding. If bleeding, will reschedule, if not, then will keep appointment as planned. Patient verbalized that she is aware that she needs to have procedure done and "I know I have been puttting this off, I don't want to put it off any longer."  Patient states she will call with update on Tuesday 11/27/14 for scheduling.   Routing to provider for final review. Patient agreeable to disposition. Will close encounter

## 2014-11-23 NOTE — Telephone Encounter (Signed)
Message left to return call to [place] at [phone].   Patient with hx of Tubal.  Will advise to call back with any bleeding on day of procedure.   Pap completed 10/19/14.

## 2014-11-23 NOTE — Telephone Encounter (Signed)
Patient has a LEEP scheduled 11/28/14. She is calling to speak with the nurse as she has not started her menstrual cycle yet this month.

## 2014-11-26 ENCOUNTER — Telehealth: Payer: Self-pay | Admitting: Obstetrics and Gynecology

## 2014-11-26 NOTE — Telephone Encounter (Signed)
Keep appointment for LEEP as scheduled. I should be able to proceed as long as the bleeding is light.   Thanks.

## 2014-11-26 NOTE — Telephone Encounter (Signed)
Message left to return call to [place] at [phone].  Detailed message okay per designated party release form. Advised per Dr. Edward JollySilva, okay to keep appointment for Wednesday as scheduled. Will be okay as long as bleeding is light. Patient is advised to call back with any questions, advised to check in at 1445 for appointment at 1500 with Dr. Edward JollySilva.

## 2014-11-26 NOTE — Telephone Encounter (Signed)
Patient says [facility] Anaracy told her to call today with an update. Patient says this is regarding a procedure.

## 2014-11-26 NOTE — Telephone Encounter (Signed)
Spoke with patient. She is scheduled for LEEP with Dr. Edward JollySilva 11/28/14 Wednesday.  Patient started her cycle 11/24/14. She states her cycle is usually 5 days and very light on 5th day. She would like to keep appointment as scheduled as patient has had some difficulty with scheduling and obtaining time off work. Advised patient likely will need to reschedule, however, patient requests she know from Dr. Edward JollySilva directly what her suggestion is to do. Cancel appointment and reschedule for next month (patient will need time to request time off from work) or keep appointment for 3/9 for LEEP as scheduled.

## 2014-11-28 ENCOUNTER — Ambulatory Visit (INDEPENDENT_AMBULATORY_CARE_PROVIDER_SITE_OTHER): Payer: 59 | Admitting: Obstetrics and Gynecology

## 2014-11-28 VITALS — BP 102/62 | HR 80 | Resp 16 | Wt 170.2 lb

## 2014-11-28 DIAGNOSIS — R87610 Atypical squamous cells of undetermined significance on cytologic smear of cervix (ASC-US): Secondary | ICD-10-CM

## 2014-11-28 DIAGNOSIS — R87810 Cervical high risk human papillomavirus (HPV) DNA test positive: Secondary | ICD-10-CM | POA: Diagnosis not present

## 2014-11-28 DIAGNOSIS — R896 Abnormal cytological findings in specimens from other organs, systems and tissues: Secondary | ICD-10-CM

## 2014-11-28 DIAGNOSIS — IMO0002 Reserved for concepts with insufficient information to code with codable children: Secondary | ICD-10-CM

## 2014-11-28 HISTORY — PX: CERVICAL BIOPSY  W/ LOOP ELECTRODE EXCISION: SUR135

## 2014-11-28 NOTE — Progress Notes (Signed)
Subjective:     Patient ID: Victoria Barr, female   DOB: 03/24/1971, 44 y.o.   MRN: [id number]  HPI  Patient presents for LEEP procedure for history of colpo biopsy showing LGSIL and unsatisfactory visualization of lesion going into cervical canal.  Patient delayed in return for LEEP procedure, so she had a pap re-evaluation 10/19/14 which shoed ASCUS and positive HR HPV.   States LMP 11/24/14 and having light bleeding today.  Menses are lighter and less crampy for last 2 months.   Just quit smoking.   Status post BTL.   States she has less stress in her life now.  Is working for cardiology office.  Used to work in [employer] [place]  Review of Systems    See HPI. Objective:   Physical Exam  Genitourinary:     Consent for colposcopy/LEEP done. Grounding pad placed. Insulated speculum placed. Colposcopy performed with acetic acid 3%.  See findings above.  Lugol's solution placed.  Squamocolumnar junction identified.  Local 2% lidocaine with epi 1:100,000 - 20 cc injected circumferentially in the cervix.  LEEP performed with white loop and cutting setting of 60 watts.   Exocervical specimen removed and later marked with blue pin at 12:00 and sent to pathology. Second pass with small loop of the endocervical button using cutting setting of 60 watts. Endocervical specimen removed and later marked with yellow pin at 12:00 and sent to pathology. Cautery to base of the LEEP site. Good hemostasis.  Monsel's placed.  EBL 10 cc.  No complications.  Tolerated well.    Assessment:     LGSIL.  Positive HR HPV.  Previously unsatisfactory colposcopy.     Plan:     Follow up LEEP pathology report.  Instructions/precautions given.  Congratulations for smoking cessation!  Discussed beneficial effects of smoking cessation on cervical health.  Discussed healthy lifestyle and folic acid supplementation.  Follow up in 4 weeks for a recheck.  Pelvic rest for 4 weeks.    After visit summary to patient.   15 minutes face to face time of which over 50% was spent in counseling.

## 2014-11-29 ENCOUNTER — Encounter: Payer: Self-pay | Admitting: Obstetrics and Gynecology

## 2014-11-29 ENCOUNTER — Telehealth: Payer: Self-pay

## 2014-11-29 ENCOUNTER — Other Ambulatory Visit (HOSPITAL_COMMUNITY): Payer: Self-pay | Admitting: Orthopedic Surgery

## 2014-11-29 DIAGNOSIS — M5440 Lumbago with sciatica, unspecified side: Secondary | ICD-10-CM

## 2014-11-29 NOTE — Telephone Encounter (Signed)
Left detailed message for patient to return to leave a urine sample to send for urine culture.//kn

## 2014-11-30 LAB — IPS OTHER TISSUE BIOPSY

## 2014-12-03 ENCOUNTER — Other Ambulatory Visit: Payer: Self-pay | Admitting: Obstetrics and Gynecology

## 2014-12-03 ENCOUNTER — Telehealth: Payer: Self-pay

## 2014-12-03 ENCOUNTER — Ambulatory Visit (HOSPITAL_COMMUNITY)
Admission: RE | Admit: 2014-12-03 | Discharge: 2014-12-03 | Disposition: A | Discharge status: Home / Self Care | Payer: 59 | Source: Ambulatory Visit | Attending: Orthopedic Surgery | Admitting: Orthopedic Surgery

## 2014-12-03 DIAGNOSIS — M545 Low back pain: Secondary | ICD-10-CM | POA: Diagnosis present

## 2014-12-03 DIAGNOSIS — M79604 Pain in right leg: Secondary | ICD-10-CM | POA: Diagnosis not present

## 2014-12-03 DIAGNOSIS — R200 Anesthesia of skin: Secondary | ICD-10-CM | POA: Insufficient documentation

## 2014-12-03 DIAGNOSIS — M79605 Pain in left leg: Secondary | ICD-10-CM | POA: Insufficient documentation

## 2014-12-03 DIAGNOSIS — M5127 Other intervertebral disc displacement, lumbosacral region: Secondary | ICD-10-CM | POA: Diagnosis not present

## 2014-12-03 DIAGNOSIS — R202 Paresthesia of skin: Secondary | ICD-10-CM | POA: Insufficient documentation

## 2014-12-03 DIAGNOSIS — M5440 Lumbago with sciatica, unspecified side: Secondary | ICD-10-CM

## 2014-12-03 MED ORDER — FLUCONAZOLE 150 MG PO TABS: 150.0000 mg | ORAL_TABLET | Freq: Once | ORAL | Status: DC

## 2014-12-03 NOTE — Telephone Encounter (Signed)
Called patient at [phone] to discuss results of LEEP pathology.  Patient states when here for procedure was having some vaginal burning/irritation and thought may have a yeast infection.  She states symptoms much worse now and feels she has a "full blown" yeast infection.  She wants to know if we can call in something for this.  She states she thought Dr. Edward JollySilva was going to prescribe something at last ov but nothing was called into pharmacy.  Advised patient will discuss with Dr. Edward JollySilva and call her back with recommendations but may need ov.--[facility]  Routed to Dr. Edward JollySilva.

## 2014-12-03 NOTE — Telephone Encounter (Signed)
Spoke with patient and notified Dr. Edward JollySilva has E-scribed Diflucan.  Patient states it is definitely yeast infection and not urinary problems.  Patient states she received a call from MinaKelly to come in for a specimen to send for urine culture but she doesn't feel she needs this.  Advised patient to call us if symptoms don't resolve.

## 2014-12-03 NOTE — Telephone Encounter (Signed)
Please contact patient back.   Ok for Diflucan 150 mg.  I will send to her pharmacy.   We were trying to contact the patient regarding a urine specimen.  She had indicated some urinary symptoms when she came in for the LEEP, and in the end, a urine specimen was not sent for culture.  If she is having painful urination, we will need to send a specimen for urine culture.

## 2014-12-04 NOTE — Telephone Encounter (Signed)
Patient aware. Talked to A. Durwin Noraixon, CMA-see telephone note.//kn

## 2014-12-19 ENCOUNTER — Telehealth: Payer: Self-pay | Admitting: Neurology

## 2014-12-19 NOTE — Telephone Encounter (Signed)
Pt called to cancel her NP appt for 12/21/14. Pt did not give a reason to why.  Dr Gloris Manchesterraci Truner/ referring provider was notified.

## 2014-12-21 ENCOUNTER — Ambulatory Visit: Payer: 59 | Admitting: Neurology

## 2014-12-28 ENCOUNTER — Ambulatory Visit (INDEPENDENT_AMBULATORY_CARE_PROVIDER_SITE_OTHER): Payer: 59 | Admitting: Obstetrics and Gynecology

## 2014-12-28 ENCOUNTER — Encounter: Payer: Self-pay | Admitting: Obstetrics and Gynecology

## 2014-12-28 VITALS — BP 122/72 | HR 88 | Ht 68.5 in | Wt 169.6 lb

## 2014-12-28 DIAGNOSIS — K5900 Constipation, unspecified: Secondary | ICD-10-CM | POA: Diagnosis not present

## 2014-12-28 DIAGNOSIS — R140 Abdominal distension (gaseous): Secondary | ICD-10-CM

## 2014-12-28 DIAGNOSIS — N900 Mild vulvar dysplasia: Secondary | ICD-10-CM | POA: Diagnosis not present

## 2014-12-28 DIAGNOSIS — Z803 Family history of malignant neoplasm of breast: Secondary | ICD-10-CM | POA: Diagnosis not present

## 2014-12-28 DIAGNOSIS — N870 Mild cervical dysplasia: Secondary | ICD-10-CM | POA: Diagnosis not present

## 2014-12-28 NOTE — Progress Notes (Signed)
Patient ID: Victoria Barr, female   DOB: [birth year], 44 y.o.   MRN: [id number] GYNECOLOGY  VISIT   HPI: 44 y.o.   Single  Caucasian  female   G1P1001 with Patient's last menstrual period was 12/21/2014 (exact date).   here for 4 weeks post LEEP procedure.   No problems with recovery.  Spotted for a couple of weeks.   Had first menses.   LEEP pathology showed CIN I and negative margins.   Also has VIN I by colpo biopsy from July 2015.  Maternal aunt with stage IV breast cancer.  Worried about this. No family history history of female breast cancer, ovarian cancer, colon or uterine cancer. Asking if she needs genetic testing.   Having constipation and bloating.  Using stool softeners.  Had intestinal malrotation as an infant.  Had intestinal surgery to tack bowel in place and had appy at the same time.   GYNECOLOGIC HISTORY: Patient's last menstrual period was 12/21/2014 (exact date). Contraception:  Tubal  Menopausal hormone therapy: n/a Mammogram 12/15 - normal.   OB History    Gravida Para Term Preterm AB TAB SAB Ectopic Multiple Living   1 1 1       1          Patient Active Problem List   Diagnosis Date Noted  . Bilateral leg pain 11/13/2014  . Smoker 05/02/2014  . LGSIL (low grade squamous intraepithelial dysplasia) 05/02/2014  . VIN I (vulvar intraepithelial neoplasia I) 05/02/2014  . Chest pain 11/30/2013  . Abnormal EKG 11/30/2013  . Palpitations   . MVP (mitral valve prolapse)   . Periodic health assessment, general screening, adult 06/09/2013  . Dysuria 06/09/2013    Past Medical History  Diagnosis Date  . Palpitations   . MVP (mitral valve prolapse)     echo in 2012 did not show MVP but did have mild elongation of the AMVL  . Allergy   . Anxiety   . Arthritis   . Depression   . Hyperlipidemia   . Tachycardia   . Intestinal malrotation   . MVP (mitral valve prolapse)     diagnosed as teen and now has cleared  . Hypertension 05/2013    Past  Surgical History  Procedure Laterality Date  . Intestinal malrotation repair  1999  . Tubal ligation    . Appendectomy    . Colon surgery    . Colposcopy  2006    LGSIL, normal since  . Excision morton's neuroma Bilateral 2002    Current Outpatient Prescriptions  Medication Sig Dispense Refill  . diclofenac (VOLTAREN) 75 MG EC tablet Take 75 mg by mouth as needed.     No current facility-administered medications for this visit.     ALLERGIES: Imitrex; Penicillins; Prozac; Sulfa antibiotics; and Zoloft  Family History  Problem Relation Age of Onset  . Heart disease Mother   . Hypertension Mother   . Aneurysm Father 36    brain   . Hypertension Sister   . Heart disease Maternal Grandmother   . Cancer Paternal Grandmother   . Cancer Paternal Grandfather     History   Social History  . Marital Status: Single    Spouse Name: N/A  . Number of Children: N/A  . Years of Education: N/A   Occupational History  . Not on file.   Social History Main Topics  . Smoking status: Former Games developermoker  . Smokeless tobacco: Never Used     Comment: quit 1 year ago,  sep 2013  . Alcohol Use: No     Comment: occasionally  . Drug Use: No  . Sexual Activity: Not Currently    Birth Control/ Protection: Surgical     Comment: BTL   Other Topics Concern  . Not on file   Social History Narrative    ROS:  Pertinent items are noted in HPI.  PHYSICAL EXAMINATION:    BP 122/72 mmHg  Pulse 88  Ht 5' 8.5" (1.74 m)  Wt 169 lb 9.6 oz (76.93 kg)  BMI 25.41 kg/m2  LMP 12/21/2014 (Exact Date)     General appearance: alert, cooperative and appears stated age   Abdomen: normal bowel sounds, soft, non-tender; no masses,  no organomegaly    Pelvic: External genitalia:  no lesions              Urethra:  normal appearing urethra with no masses, tenderness or lesions              Bartholins and Skenes: normal                 Vagina: normal appearing vagina with normal color and discharge, no  lesions              Cervix: normal appearance.  Consistent with LEEP.                   Bimanual Exam:  Uterus:  uterus is normal size, shape, consistency and nontender                                      Adnexa: normal adnexa in size, nontender and no masses                                       ASSESSMENT  Status post LEEP.  Well healed.  VIN I. Constipation. Abdominal bloating.  FH of breast cancer in aunt.   PLAN  Discussed pathology report and history of VIN I. May resume all activities including sexual activity.  Next pap in March 2017. Discussed signs and symptoms of vulvar dysplasia. Discussed Miralax, increasing water, increasing activity.  Discussed signs of intestinal obstruction.  Discussed genetic testing for breast cancer.  I do not think this in indicated at this time.  I discussed 3D mammograms, yearly clinical breast exam, and monthly SBE.  Follow up for annual exam in 5 months.    An After Visit Summary was printed and given to the patient.  _25_____ minutes face to face time of which over 50% was spent in counseling.

## 2014-12-31 ENCOUNTER — Ambulatory Visit (INDEPENDENT_AMBULATORY_CARE_PROVIDER_SITE_OTHER): Payer: 59 | Admitting: Urgent Care

## 2014-12-31 VITALS — BP 128/78 | HR 88 | Temp 98.4°F | Resp 20 | Ht 68.5 in | Wt 168.0 lb

## 2014-12-31 DIAGNOSIS — J029 Acute pharyngitis, unspecified: Secondary | ICD-10-CM

## 2014-12-31 DIAGNOSIS — J302 Other seasonal allergic rhinitis: Secondary | ICD-10-CM | POA: Diagnosis not present

## 2014-12-31 DIAGNOSIS — J329 Chronic sinusitis, unspecified: Secondary | ICD-10-CM

## 2014-12-31 DIAGNOSIS — J3489 Other specified disorders of nose and nasal sinuses: Secondary | ICD-10-CM

## 2014-12-31 DIAGNOSIS — R05 Cough: Secondary | ICD-10-CM | POA: Diagnosis not present

## 2014-12-31 DIAGNOSIS — R0981 Nasal congestion: Secondary | ICD-10-CM | POA: Diagnosis not present

## 2014-12-31 DIAGNOSIS — R059 Cough, unspecified: Secondary | ICD-10-CM

## 2014-12-31 MED ORDER — FLUTICASONE PROPIONATE 50 MCG/ACT NA SUSP: 2.0000 | Freq: Every day | NASAL | Status: DC

## 2014-12-31 MED ORDER — DOXYCYCLINE HYCLATE 100 MG PO CAPS: 100.0000 mg | ORAL_CAPSULE | Freq: Two times a day (BID) | ORAL | Status: DC

## 2014-12-31 MED ORDER — HYDROCODONE-HOMATROPINE 5-1.5 MG/5ML PO SYRP: 5.0000 mL | ORAL_SOLUTION | Freq: Every evening | ORAL | Status: DC | PRN

## 2014-12-31 MED ORDER — PSEUDOEPHEDRINE HCL ER 120 MG PO TB12: 120.0000 mg | ORAL_TABLET | Freq: Two times a day (BID) | ORAL | Status: DC

## 2014-12-31 MED ORDER — LORATADINE 10 MG PO TABS: 10.0000 mg | ORAL_TABLET | Freq: Every day | ORAL | Status: DC

## 2014-12-31 NOTE — Patient Instructions (Signed)

## 2014-12-31 NOTE — Progress Notes (Signed)
    MRN: [id number] DOB: 1971/07/05  Subjective:   Victoria Barr is a 44 y.o. female presenting for chief complaint of Headache; Ear Pain; and Sore Throat  Reports 1 week history of Sinus congestion, sinus pain, itchy watery eyes, red eyes, ear pain, throat pain, cough and neck soreness, subjective fever. Has tried Zyrtec, netty pot, Mucinex with some relief. Denies ear fullness, ear pain, difficulty swallowing , wheezing, shortness of breath, chest tightness, chest pain and myalgia, fatigue, nausea, vomiting, abdominal pain and diarrhea. Has had 1 sick contact with flu symptoms. Admits history of seasonal allergies has been taking her Zyrtec and Flonase, denies history of asthma. Patient did take flu shot this season. Smokes 1-2 cigarettes per day, is currently cutting back down from smoking 1 pack per day, occasional alcohol drink. Denies any other aggravating or relieving factors, no other questions or concerns.  Victoria Barr has a current medication list which includes the following prescription(s): diclofenac. She is allergic to imitrex; penicillins; prozac; sulfa antibiotics; and zoloft.  Victoria Barr  has a past medical history of Palpitations; MVP (mitral valve prolapse); Allergy; Anxiety; Arthritis; Depression; Hyperlipidemia; Tachycardia; Intestinal malrotation; MVP (mitral valve prolapse); and Hypertension (05/2013). Also  has past surgical history that includes Intestinal malrotation repair (1999); Tubal ligation; Appendectomy; Colon surgery; Colposcopy (2006); and Excision Morton's neuroma (Bilateral, 2002).  ROS As in subjective.  Objective:   Vitals: BP 128/78 mmHg  Pulse 88  Temp(Src) 98.4 F (36.9 C) (Oral)  Resp 20  Ht 5' 8.5" (1.74 m)  Wt 168 lb (76.204 kg)  BMI 25.17 kg/m2  SpO2 99%  LMP 12/21/2014 (Exact Date)  Physical Exam  Constitutional: She is well-developed, well-nourished, and in no distress.  HENT:  TM's flat bilaterally, no effusions or erythema. Nasal turbinates  inflamed and erythematous with whitish mucus. Bilateral maxillary sinus tenderness, L>R. Postnasal drip present, without oropharyngeal exudates, erythema or abscesses.  Eyes: Right eye exhibits no discharge. Left eye exhibits no discharge. No scleral icterus.  Conjunctiva slightly injected bilaterally.  Cardiovascular: Normal rate, regular rhythm and intact distal pulses.  Exam reveals no gallop and no friction rub.   No murmur heard. Pulmonary/Chest: No stridor. No respiratory distress. She has no wheezes. She has no rales. She exhibits no tenderness.  Lymphadenopathy:    She has cervical adenopathy (anterior, bilateral).  Skin: Skin is warm and dry. No rash noted. No erythema. No pallor.   Assessment and Plan :   1. Sinus pain 2. Sinusitis, unspecified chronicity, unspecified location 3. Nasal congestion 4. Seasonal allergies 5. Cough 6. Sore throat - Start empiric treatment with doxycycline - Advised better control of allergies, start claritin with zyrtec, start nasal steroid, atrovent and sudafed for nasal congestion short term relief, Hycodan for cough and sore throat - Return to clinic if symptoms fail to resolve despite antibiotic course.  Wallis BambergMario Janann Boeve, PA-C [facility] [phone] 12/31/2014 7:48 PM

## 2015-01-18 ENCOUNTER — Ambulatory Visit: Payer: 59 | Admitting: Cardiology

## 2015-02-01 ENCOUNTER — Other Ambulatory Visit: Payer: Self-pay | Admitting: Urgent Care

## 2015-02-06 ENCOUNTER — Ambulatory Visit (INDEPENDENT_AMBULATORY_CARE_PROVIDER_SITE_OTHER): Payer: 59 | Admitting: Cardiology

## 2015-02-06 ENCOUNTER — Other Ambulatory Visit: Payer: Self-pay | Admitting: *Deleted

## 2015-02-06 DIAGNOSIS — R42 Dizziness and giddiness: Secondary | ICD-10-CM

## 2015-02-06 DIAGNOSIS — I471 Supraventricular tachycardia: Secondary | ICD-10-CM

## 2015-02-06 DIAGNOSIS — R55 Syncope and collapse: Secondary | ICD-10-CM

## 2015-02-06 DIAGNOSIS — R002 Palpitations: Secondary | ICD-10-CM | POA: Diagnosis not present

## 2015-02-06 LAB — TSH: TSH: 0.85 u[IU]/mL (ref 0.35–4.50)

## 2015-02-06 LAB — CBC
HCT: 42.7 % (ref 36.0–46.0)
Hemoglobin: 14.5 g/dL (ref 12.0–15.0)
MCHC: 33.9 g/dL (ref 30.0–36.0)
MCV: 87.1 fl (ref 78.0–100.0)
Platelets: 267 10*3/uL (ref 150.0–400.0)
RBC: 4.9 Mil/uL (ref 3.87–5.11)
RDW: 13.1 % (ref 11.5–15.5)
WBC: 8.7 10*3/uL (ref 4.0–10.5)

## 2015-02-06 NOTE — Patient Instructions (Signed)
CBC and TSH today.

## 2015-02-06 NOTE — Progress Notes (Signed)
Cardiology Office Note   Date:  02/06/2015   ID:  Victoria Quantara L Mervine, DOB 10/30/1970, MRN [id number]  PCP:  Carmelina DaneAnderson, Jeffery S, MD    Chief Complaint  Patient presents with  . Palpitations      History of Present Illness: Victoria Barr is a 44 y.o. female who presents for palpitations.  She was working this am and had sudden onset of palpitations and took her pulse and it was 135bpm.  She then started to feel dizzy but did not have syncope.  She denies any chest pain or SOB.  She drinks 2 cups of caffeinated coffee daily.  EKG showed sinus tachycardia at 121bpm.  She denies any other symptoms.  She now says that her HR is back to normal.      Past Medical History  Diagnosis Date  . Palpitations   . MVP (mitral valve prolapse)     echo in 2012 did not show MVP but did have mild elongation of the AMVL  . Allergy   . Anxiety   . Arthritis   . Depression   . Hyperlipidemia   . Tachycardia   . Intestinal malrotation   . MVP (mitral valve prolapse)     diagnosed as teen and now has cleared  . Hypertension 05/2013    Past Surgical History  Procedure Laterality Date  . Intestinal malrotation repair  1999  . Tubal ligation    . Appendectomy    . Colon surgery    . Colposcopy  2006    LGSIL, normal since  . Excision morton's neuroma Bilateral 2002     Current Outpatient Prescriptions  Medication Sig Dispense Refill  . diclofenac (VOLTAREN) 75 MG EC tablet Take 75 mg by mouth as needed.    . doxycycline (VIBRAMYCIN) 100 MG capsule Take 1 capsule (100 mg total) by mouth 2 (two) times daily. 20 capsule 0  . fluticasone (FLONASE) 50 MCG/ACT nasal spray Place 2 sprays into both nostrils daily. 16 g 11  . HYDROcodone-homatropine (HYCODAN) 5-1.5 MG/5ML syrup Take 5 mLs by mouth at bedtime as needed. 120 mL 0  . loratadine (CLARITIN) 10 MG tablet Take 1 tablet (10 mg total) by mouth daily. 30 tablet 11  . pseudoephedrine (SUDAFED 12 HOUR) 120 MG 12 hr tablet Take 1 tablet  (120 mg total) by mouth 2 (two) times daily. 30 tablet 3   No current facility-administered medications for this visit.    Allergies:   Imitrex; Penicillins; Prozac; Sulfa antibiotics; and Zoloft    Social History:  The patient  reports that she has quit smoking. She has never used smokeless tobacco. She reports that she does not drink alcohol or use illicit drugs.   Family History:  The patient's family history includes Aneurysm (age of onset: 1836) in her father; Cancer in her paternal grandfather and paternal grandmother; Heart disease in her maternal grandmother and mother; Hypertension in her mother and sister.    ROS:  Please see the history of present illness.   Otherwise, review of systems are positive for none.   All other systems are reviewed and negative.    PHYSICAL EXAM: VS:  There were no vitals taken for this visit. , BMI There is no weight on file to calculate BMI. GEN: Well nourished, well developed, in no acute distress HEENT: normal Neck: no JVD, carotid bruits, or masses Cardiac: RRR; no murmurs, rubs, or gallops,no edema  Respiratory:  clear to auscultation bilaterally, normal work of breathing GI:  soft, nontender, nondistended, + BS MS: no deformity or atrophy Skin: warm and dry, no rash Neuro:  Strength and sensation are intact Psych: euthymic mood, full affect   EKG:  EKG is ordered today. The ekg ordered today demonstrates Sinus tachycardia at 121bpm with no ST changes.  Repeat EKG showed NSR at 76 bpm with no ST changes   Recent Labs: 11/14/2014: ALT 19; BUN 13; Creatinine 0.77; Hemoglobin 14.2; Platelets 228.0; Potassium 4.3; Sodium 138; TSH 0.88    Lipid Panel    Component Value Date/Time   CHOL 221* 11/14/2014 0754   TRIG 99.0 11/14/2014 0754   HDL 76.40 11/14/2014 0754   CHOLHDL 3 11/14/2014 0754   VLDL 19.8 11/14/2014 0754   LDLCALC 125* 11/14/2014 0754      Wt Readings from Last 3 Encounters:  12/31/14 168 lb (76.204 kg)  12/28/14 169  lb 9.6 oz (76.93 kg)  12/03/14 166 lb (75.297 kg)        ASSESSMENT AND PLAN:  1.  Palpitations that occurred suddenly.  EKG shows sinus tachycardia initially and now is back to HR 76bpm.  This is most likely driven by caffeine and stress.  I will check a TSH and a CBC.  She is not orthostatic on exam today.  I encouraged her to cut back on her caffeine intake and try to stay hydrated.    Current medicines are reviewed at length with the patient today.  The patient does not have concerns regarding medicines.  The following changes have been made:  no change  Labs/ tests ordered today include: see above assessment and plan  Orders Placed This Encounter  Procedures  . CBC  . TSH  . EKG 12-Lead     Disposition:   FU with me PRN   Signed, Quintella ReichertURNER,Olga Bourbeau R, MD  02/06/2015 1:04 PM    [facility] [address], [place], [place]  [address] Phone: [phone]) [phone]; Fax: [phone]) [phone]

## 2015-03-08 ENCOUNTER — Encounter: Payer: Self-pay | Admitting: Internal Medicine

## 2015-03-15 ENCOUNTER — Other Ambulatory Visit: Payer: Self-pay | Admitting: Physician Assistant

## 2015-03-15 DIAGNOSIS — R109 Unspecified abdominal pain: Secondary | ICD-10-CM

## 2015-03-22 DIAGNOSIS — N12 Tubulo-interstitial nephritis, not specified as acute or chronic: Secondary | ICD-10-CM

## 2015-03-22 HISTORY — DX: Tubulo-interstitial nephritis, not specified as acute or chronic: N12

## 2015-04-03 ENCOUNTER — Other Ambulatory Visit: Payer: Self-pay | Admitting: Orthopedic Surgery

## 2015-04-03 DIAGNOSIS — R52 Pain, unspecified: Secondary | ICD-10-CM

## 2015-04-05 ENCOUNTER — Encounter (HOSPITAL_COMMUNITY): Payer: Self-pay

## 2015-04-05 ENCOUNTER — Emergency Department (HOSPITAL_COMMUNITY)
Admission: EM | Admit: 2015-04-05 | Discharge: 2015-04-05 | Disposition: A | Discharge status: Home / Self Care | Payer: 59 | Attending: Emergency Medicine | Admitting: Emergency Medicine

## 2015-04-05 ENCOUNTER — Ambulatory Visit
Admission: RE | Admit: 2015-04-05 | Discharge: 2015-04-05 | Disposition: A | Discharge status: Home / Self Care | Payer: 59 | Source: Ambulatory Visit | Attending: Orthopedic Surgery | Admitting: Orthopedic Surgery

## 2015-04-05 DIAGNOSIS — Z9049 Acquired absence of other specified parts of digestive tract: Secondary | ICD-10-CM | POA: Diagnosis not present

## 2015-04-05 DIAGNOSIS — N10 Acute tubulo-interstitial nephritis: Secondary | ICD-10-CM | POA: Insufficient documentation

## 2015-04-05 DIAGNOSIS — I10 Essential (primary) hypertension: Secondary | ICD-10-CM | POA: Insufficient documentation

## 2015-04-05 DIAGNOSIS — Z8659 Personal history of other mental and behavioral disorders: Secondary | ICD-10-CM | POA: Diagnosis not present

## 2015-04-05 DIAGNOSIS — Z9851 Tubal ligation status: Secondary | ICD-10-CM | POA: Insufficient documentation

## 2015-04-05 DIAGNOSIS — R1030 Lower abdominal pain, unspecified: Secondary | ICD-10-CM | POA: Diagnosis present

## 2015-04-05 DIAGNOSIS — Z880 Allergy status to penicillin: Secondary | ICD-10-CM | POA: Diagnosis not present

## 2015-04-05 DIAGNOSIS — Z8639 Personal history of other endocrine, nutritional and metabolic disease: Secondary | ICD-10-CM | POA: Insufficient documentation

## 2015-04-05 DIAGNOSIS — Z79899 Other long term (current) drug therapy: Secondary | ICD-10-CM | POA: Insufficient documentation

## 2015-04-05 DIAGNOSIS — R110 Nausea: Secondary | ICD-10-CM | POA: Diagnosis not present

## 2015-04-05 DIAGNOSIS — Z3202 Encounter for pregnancy test, result negative: Secondary | ICD-10-CM | POA: Insufficient documentation

## 2015-04-05 DIAGNOSIS — Z87891 Personal history of nicotine dependence: Secondary | ICD-10-CM | POA: Diagnosis not present

## 2015-04-05 DIAGNOSIS — Z7982 Long term (current) use of aspirin: Secondary | ICD-10-CM | POA: Diagnosis not present

## 2015-04-05 DIAGNOSIS — R52 Pain, unspecified: Secondary | ICD-10-CM

## 2015-04-05 DIAGNOSIS — Z7951 Long term (current) use of inhaled steroids: Secondary | ICD-10-CM | POA: Insufficient documentation

## 2015-04-05 LAB — COMPREHENSIVE METABOLIC PANEL
ALT: 14 U/L (ref 14–54)
AST: 18 U/L (ref 15–41)
Albumin: 3.8 g/dL (ref 3.5–5.0)
Alkaline Phosphatase: 78 U/L (ref 38–126)
Anion gap: 6 (ref 5–15)
BUN: 15 mg/dL (ref 6–20)
CO2: 27 mmol/L (ref 22–32)
Calcium: 9.5 mg/dL (ref 8.9–10.3)
Chloride: 103 mmol/L (ref 101–111)
Creatinine, Ser: 0.9 mg/dL (ref 0.44–1.00)
GFR calc Af Amer: >60 mL/min (ref >60)
GFR calc non Af Amer: >60 mL/min (ref >60)
Glucose, Bld: 99 mg/dL (ref 65–99)
Potassium: 3.7 mmol/L (ref 3.5–5.1)
Sodium: 136 mmol/L (ref 135–145)
Total Bilirubin: 0.2 mg/dL — ABNORMAL LOW (ref 0.3–1.2)
Total Protein: 7.3 g/dL (ref 6.5–8.1)

## 2015-04-05 LAB — URINALYSIS, ROUTINE W REFLEX MICROSCOPIC
Bilirubin Urine: NEGATIVE
Glucose, UA: NEGATIVE mg/dL
Ketones, ur: NEGATIVE mg/dL
Leukocytes, UA: NEGATIVE
Nitrite: NEGATIVE
Protein, ur: NEGATIVE mg/dL
Specific Gravity, Urine: 1.043 — ABNORMAL HIGH (ref 1.005–1.030)
Urobilinogen, UA: 0.2 mg/dL (ref 0.0–1.0)
pH: 5.5 (ref 5.0–8.0)

## 2015-04-05 LAB — URINE MICROSCOPIC-ADD ON

## 2015-04-05 LAB — CBC
HCT: 40.5 % (ref 36.0–46.0)
Hemoglobin: 13.6 g/dL (ref 12.0–15.0)
MCH: 29.8 pg (ref 26.0–34.0)
MCHC: 33.6 g/dL (ref 30.0–36.0)
MCV: 88.8 fL (ref 78.0–100.0)
Platelets: 231 10*3/uL (ref 150–400)
RBC: 4.56 MIL/uL (ref 3.87–5.11)
RDW: 12.6 % (ref 11.5–15.5)
WBC: 6.4 10*3/uL (ref 4.0–10.5)

## 2015-04-05 LAB — I-STAT BETA HCG BLOOD, ED (MC, WL, AP ONLY): I-stat hCG, quantitative: <5 m[IU]/mL (ref <5)

## 2015-04-05 LAB — LIPASE, BLOOD: Lipase: 27 U/L (ref 22–51)

## 2015-04-05 MED ORDER — CEPHALEXIN 500 MG PO CAPS: 500.0000 mg | ORAL_CAPSULE | Freq: Three times a day (TID) | ORAL | Status: DC

## 2015-04-05 MED ORDER — SODIUM CHLORIDE 0.9 % IV BOLUS (SEPSIS)
2000.0000 mL | Freq: Once | INTRAVENOUS | Status: AC
  Administered 2015-04-05: 2000 mL via INTRAVENOUS

## 2015-04-05 MED ORDER — IOPAMIDOL (ISOVUE-300) INJECTION 61%
100.0000 mL | Freq: Once | INTRAVENOUS | Status: AC | PRN
  Administered 2015-04-05: 100 mL via INTRAVENOUS

## 2015-04-05 MED ORDER — DEXTROSE 5 % IV SOLN
1.0000 g | Freq: Once | INTRAVENOUS | Status: AC
  Administered 2015-04-05: 1 g via INTRAVENOUS
  Filled 2015-04-05: qty 10

## 2015-04-05 MED ORDER — METOCLOPRAMIDE HCL 10 MG PO TABS: 10.0000 mg | ORAL_TABLET | Freq: Four times a day (QID) | ORAL | Status: DC | PRN

## 2015-04-05 NOTE — ED Provider Notes (Signed)
CSN: [id number]     Arrival date & time 04/05/15  1549 History   First MD Initiated Contact with Patient 04/05/15 1702     Chief Complaint  Patient presents with  . Abdominal Pain     (Consider location/radiation/quality/duration/timing/severity/associated sxs/prior Treatment) Patient is a 44 y.o. female presenting with abdominal pain.  Abdominal Pain  Complains of lower abdominal pain radiating to back onset 2 weeks ago is accompanied by mild nausea. No vomiting. No fever. She's been treated with Percocet, with partial relief. No known fever. She was seen by Dr. Charlett BlakeVoytek within the past few days who ordered CT scan of abdomen and pelvis which patient had this morning which reveals right-sided pyelonephritis. No other associated symptoms. Nothing makes symptoms better or worse. Last normal menstrual period 3 weeks ago. Past Medical History  Diagnosis Date  . Palpitations   . MVP (mitral valve prolapse)     echo in 2012 did not show MVP but did have mild elongation of the AMVL  . Allergy   . Anxiety   . Arthritis   . Depression   . Hyperlipidemia   . Tachycardia   . Intestinal malrotation   . MVP (mitral valve prolapse)     diagnosed as teen and now has cleared  . Hypertension 05/2013   Past Surgical History  Procedure Laterality Date  . Intestinal malrotation repair  1999  . Tubal ligation    . Appendectomy    . Colon surgery    . Colposcopy  2006    LGSIL, normal since  . Excision morton's neuroma Bilateral 2002   Family History  Problem Relation Age of Onset  . Heart disease Mother   . Hypertension Mother   . Aneurysm Father 36    brain   . Hypertension Sister   . Heart disease Maternal Grandmother   . Cancer Paternal Grandmother   . Cancer Paternal Grandfather    History  Substance Use Topics  . Smoking status: Former Games developermoker  . Smokeless tobacco: Never Used     Comment: quit 1 year ago, sep 2013  . Alcohol Use: No     Comment: occasionally   OB History     Gravida Para Term Preterm AB TAB SAB Ectopic Multiple Living   1 1 1       1      Review of Systems  Gastrointestinal: Positive for abdominal pain.  Musculoskeletal: Positive for back pain.       Chronic back pain  All other systems reviewed and are negative.     Allergies  Iohexol; Imitrex; Penicillins; Prozac; Sulfa antibiotics; and Zoloft  Home Medications   Prior to Admission medications   Medication Sig Start Date End Date Taking? Authorizing Provider  diclofenac (VOLTAREN) 75 MG EC tablet Take 75 mg by mouth as needed.    Historical Provider, MD  doxycycline (VIBRAMYCIN) 100 MG capsule Take 1 capsule (100 mg total) by mouth 2 (two) times daily. 12/31/14   Wallis BambergMario Mani, PA-C  fluticasone [facility](FLONASE) 50 MCG/ACT nasal spray Place 2 sprays into both nostrils daily. 12/31/14   Wallis BambergMario Mani, PA-C  HYDROcodone-homatropine [facility](HYCODAN) 5-1.5 MG/5ML syrup Take 5 mLs by mouth at bedtime as needed. 12/31/14   Wallis BambergMario Mani, PA-C  loratadine (CLARITIN) 10 MG tablet Take 1 tablet (10 mg total) by mouth daily. 12/31/14   Wallis BambergMario Mani, PA-C  pseudoephedrine (SUDAFED 12 HOUR) 120 MG 12 hr tablet Take 1 tablet (120 mg total) by mouth 2 (two) times daily. 12/31/14  Wallis Bamberg, PA-C   BP 132/72 mmHg  Pulse 92  Temp(Src) 98.1 F (36.7 C) (Oral)  Resp 16  SpO2 98%  LMP 03/21/2015 (Approximate) Physical Exam  Constitutional: She appears well-developed and well-nourished. No distress.  HENT:  Head: Normocephalic and atraumatic.  Eyes: Conjunctivae are normal. Pupils are equal, round, and reactive to light.  Neck: Neck supple. No tracheal deviation present. No thyromegaly present.  Cardiovascular: Normal rate and regular rhythm.   No murmur heard. Pulmonary/Chest: Effort normal and breath sounds normal.  Abdominal: Soft. Bowel sounds are normal. She exhibits no distension. There is no tenderness.  Genitourinary:  No flank tenderness  Musculoskeletal: Normal range of motion. She exhibits no edema or  tenderness.  Neurological: She is alert. Coordination normal.  Skin: Skin is warm and dry. No rash noted.  Psychiatric: She has a normal mood and affect.  Nursing note and vitals reviewed.   ED Course  Procedures (including critical care time) Labs Review Labs Reviewed  CBC  LIPASE, BLOOD  COMPREHENSIVE METABOLIC PANEL  URINALYSIS, ROUTINE W REFLEX MICROSCOPIC (NOT AT [facility])  I-STAT BETA HCG BLOOD, ED ([facility], [facility], [facility] ONLY)    Imaging Review Ct Abdomen Pelvis W Contrast  04/05/2015   CLINICAL DATA:  Severe lower pelvic pain.  EXAM: CT ABDOMEN AND PELVIS WITH CONTRAST  TECHNIQUE: Multidetector CT imaging of the abdomen and pelvis was performed using the standard protocol following bolus administration of intravenous contrast.  CONTRAST:  ISOVUE-300 IOPAMIDOL (ISOVUE-300) INJECTION 61%  COMPARISON:  CT scan dated 07/14/2012  FINDINGS: Lower chest: 2 mm calcified granuloma in the right lower lobe posteriorly. Lung bases are otherwise clear. Heart size is normal.  Hepatobiliary: Normal.  Pancreas: Normal.  Spleen: Normal.  Adrenals/Urinary Tract: There multiple subtle areas of abnormal low density in the right kidney including at 2 cm area of cortical decreased enhancement in the lower pole laterally. There are several other smaller areas of decreased enhancement. This findings consistent with pyelonephritis. Left kidney is normal. Both kidneys excrete contrast normally. The adrenal glands, ureters, and bladder appear normal.  Stomach/Bowel: The patient has malrotation of the colon. The descending colon extends in the midline with the cecum in the midline of the pelvis. The bowel otherwise appears normal.  Vascular/Lymphatic: Slight atherosclerosis of the distal abdominal aorta and common iliac arteries. No adenopathy.  Reproductive: Normal uterus and ovaries. Tiny amount of free fluid in the pelvis, normal for a female of this age.  Other: No free air.  Musculoskeletal: No osseous abnormalities.   IMPRESSION: Multifocal right pyelonephritis.   Electronically Signed   By: Francene Boyers M.D.   On: 04/05/2015 14:31     EKG Interpretation None     6:50 PM patient feels improved after treatment with intravenous fluids and intravenous antibiotics. Results for orders placed or performed during the hospital encounter of 04/05/15  Lipase, blood  Result Value Ref Range   Lipase 27 22 - 51 U/L  Comprehensive metabolic panel  Result Value Ref Range   Sodium 136 135 - 145 mmol/L   Potassium 3.7 3.5 - 5.1 mmol/L   Chloride 103 101 - 111 mmol/L   CO2 27 22 - 32 mmol/L   Glucose, Bld 99 65 - 99 mg/dL   BUN 15 6 - 20 mg/dL   Creatinine, Ser 1.61 0.44 - 1.00 mg/dL   Calcium 9.5 8.9 - 09.6 mg/dL   Total Protein 7.3 6.5 - 8.1 g/dL   Albumin 3.8 3.5 - 5.0 g/dL  AST 18 15 - 41 U/L   ALT 14 14 - 54 U/L   Alkaline Phosphatase 78 38 - 126 U/L   Total Bilirubin 0.2 (L) 0.3 - 1.2 mg/dL   GFR calc non Af Amer >60 >60 mL/min   GFR calc Af Amer >60 >60 mL/min   Anion gap 6 5 - 15  CBC  Result Value Ref Range   WBC 6.4 4.0 - 10.5 K/uL   RBC 4.56 3.87 - 5.11 MIL/uL   Hemoglobin 13.6 12.0 - 15.0 g/dL   HCT 40.9 81.1 - 91.4 %   MCV 88.8 78.0 - 100.0 fL   MCH 29.8 26.0 - 34.0 pg   MCHC 33.6 30.0 - 36.0 g/dL   RDW 78.2 95.6 - 21.3 %   Platelets 231 150 - 400 K/uL  Urinalysis, Routine w reflex microscopic (not at [facility])  Result Value Ref Range   Color, Urine YELLOW YELLOW   APPearance CLEAR CLEAR   Specific Gravity, Urine 1.043 (H) 1.005 - 1.030   pH 5.5 5.0 - 8.0   Glucose, UA NEGATIVE NEGATIVE mg/dL   Hgb urine dipstick MODERATE (A) NEGATIVE   Bilirubin Urine NEGATIVE NEGATIVE   Ketones, ur NEGATIVE NEGATIVE mg/dL   Protein, ur NEGATIVE NEGATIVE mg/dL   Urobilinogen, UA 0.2 0.0 - 1.0 mg/dL   Nitrite NEGATIVE NEGATIVE   Leukocytes, UA NEGATIVE NEGATIVE  Urine microscopic-add on  Result Value Ref Range   Squamous Epithelial / LPF FEW (A) RARE   WBC, UA 0-2 <3 WBC/hpf   RBC / HPF 3-6 <3  RBC/hpf  I-Stat beta hCG blood, ED ([facility], [facility], [facility] only)  Result Value Ref Range   I-stat hCG, quantitative <5.0 <5 mIU/mL   Comment 3           Ct Abdomen Pelvis W Contrast  04/05/2015   CLINICAL DATA:  Severe lower pelvic pain.  EXAM: CT ABDOMEN AND PELVIS WITH CONTRAST  TECHNIQUE: Multidetector CT imaging of the abdomen and pelvis was performed using the standard protocol following bolus administration of intravenous contrast.  CONTRAST:  ISOVUE-300 IOPAMIDOL (ISOVUE-300) INJECTION 61%  COMPARISON:  CT scan dated 07/14/2012  FINDINGS: Lower chest: 2 mm calcified granuloma in the right lower lobe posteriorly. Lung bases are otherwise clear. Heart size is normal.  Hepatobiliary: Normal.  Pancreas: Normal.  Spleen: Normal.  Adrenals/Urinary Tract: There multiple subtle areas of abnormal low density in the right kidney including at 2 cm area of cortical decreased enhancement in the lower pole laterally. There are several other smaller areas of decreased enhancement. This findings consistent with pyelonephritis. Left kidney is normal. Both kidneys excrete contrast normally. The adrenal glands, ureters, and bladder appear normal.  Stomach/Bowel: The patient has malrotation of the colon. The descending colon extends in the midline with the cecum in the midline of the pelvis. The bowel otherwise appears normal.  Vascular/Lymphatic: Slight atherosclerosis of the distal abdominal aorta and common iliac arteries. No adenopathy.  Reproductive: Normal uterus and ovaries. Tiny amount of free fluid in the pelvis, normal for a female of this age.  Other: No free air.  Musculoskeletal: No osseous abnormalities.  IMPRESSION: Multifocal right pyelonephritis.   Electronically Signed   By: Francene Boyers M.D.   On: 04/05/2015 14:31     MDM  In light of CT scan findings and symptomatology treated for pyelonephritis Plan prescription Keflex. Urine sent for culture. Patient has Percocet at home which she can take for pain  we will also write for Reglan  as needed for nausea. [facility] her PMD if not feeling better by next week. Can be treated as outpt,  No fever, no leukocytosis, not acutely ill appearin Diagnosis acute pyelonephritis Final diagnoses:  None        Doug Sou, MD 04/06/15 [phone]

## 2015-04-05 NOTE — ED Notes (Signed)
Pt presents with c/o abdominal pain and a kidney infection that she was diagnosed with by cat scan this morning. Pt reports she was told to come to the ER, she is still having some abdominal pain. Pt reporting some diarrhea but reports she also drank the barium contrast so she believes that may be a contributory factor to the diarrhea.

## 2015-04-05 NOTE — Discharge Instructions (Signed)
Pyelonephritis, Adult Take Tylenol for mild pain or the Percocet prescribed to earlier from bad pain. Don't take Tylenol and Percocet together as the combination can be dangerous. [facility] if not feeling better by next week. Return if your condition worsens for any reason. Pyelonephritis is a kidney infection. A kidney infection can happen quickly, or it can last for a long time. HOME CARE   Take your medicine (antibiotics) as told. Finish it even if you start to feel better.  Keep all doctor visits as told.  Drink enough fluids to keep your pee (urine) clear or pale yellow.  Only take medicine as told by your doctor. GET HELP RIGHT AWAY IF:   You have a fever or lasting symptoms for more than 2-3 days.  You have a fever and your symptoms suddenly get worse.  You cannot take your medicine or drink fluids as told.  You have chills and shaking.  You feel very weak or pass out (faint).  You do not feel better after 2 days. MAKE SURE YOU:  Understand these instructions.  Will watch your condition.  Will get help right away if you are not doing well or get worse. Document Released: 10/15/2004 Document Revised: 03/08/2012 Document Reviewed: 02/25/2011 [facility] Patient Information 2015 [employer] [place] [place]. This information is not intended to replace advice given to you by your health care provider. Make sure you discuss any questions you have with your health care provider.

## 2015-04-07 ENCOUNTER — Emergency Department (HOSPITAL_COMMUNITY)
Admission: EM | Admit: 2015-04-07 | Discharge: 2015-04-07 | Disposition: A | Discharge status: Home / Self Care | Payer: 59 | Attending: Emergency Medicine | Admitting: Emergency Medicine

## 2015-04-07 ENCOUNTER — Telehealth: Payer: 59 | Admitting: Family

## 2015-04-07 ENCOUNTER — Encounter (HOSPITAL_COMMUNITY): Payer: Self-pay | Admitting: *Deleted

## 2015-04-07 DIAGNOSIS — I10 Essential (primary) hypertension: Secondary | ICD-10-CM | POA: Diagnosis not present

## 2015-04-07 DIAGNOSIS — Z8659 Personal history of other mental and behavioral disorders: Secondary | ICD-10-CM | POA: Insufficient documentation

## 2015-04-07 DIAGNOSIS — Z880 Allergy status to penicillin: Secondary | ICD-10-CM | POA: Insufficient documentation

## 2015-04-07 DIAGNOSIS — N3000 Acute cystitis without hematuria: Secondary | ICD-10-CM

## 2015-04-07 DIAGNOSIS — Z8639 Personal history of other endocrine, nutritional and metabolic disease: Secondary | ICD-10-CM | POA: Diagnosis not present

## 2015-04-07 DIAGNOSIS — Z87891 Personal history of nicotine dependence: Secondary | ICD-10-CM | POA: Diagnosis not present

## 2015-04-07 DIAGNOSIS — M1990 Unspecified osteoarthritis, unspecified site: Secondary | ICD-10-CM | POA: Diagnosis not present

## 2015-04-07 DIAGNOSIS — N12 Tubulo-interstitial nephritis, not specified as acute or chronic: Secondary | ICD-10-CM | POA: Diagnosis not present

## 2015-04-07 DIAGNOSIS — R109 Unspecified abdominal pain: Secondary | ICD-10-CM | POA: Diagnosis present

## 2015-04-07 LAB — URINE CULTURE
Culture: NO GROWTH
Special Requests: NORMAL

## 2015-04-07 LAB — CBC
HCT: 39.2 % (ref 36.0–46.0)
Hemoglobin: 13.2 g/dL (ref 12.0–15.0)
MCH: 29.7 pg (ref 26.0–34.0)
MCHC: 33.7 g/dL (ref 30.0–36.0)
MCV: 88.1 fL (ref 78.0–100.0)
Platelets: 217 10*3/uL (ref 150–400)
RBC: 4.45 MIL/uL (ref 3.87–5.11)
RDW: 12.6 % (ref 11.5–15.5)
WBC: 4.5 10*3/uL (ref 4.0–10.5)

## 2015-04-07 LAB — COMPREHENSIVE METABOLIC PANEL
ALT: 33 U/L (ref 14–54)
AST: 33 U/L (ref 15–41)
Albumin: 3.8 g/dL (ref 3.5–5.0)
Alkaline Phosphatase: 74 U/L (ref 38–126)
Anion gap: 6 (ref 5–15)
BUN: 10 mg/dL (ref 6–20)
CO2: 26 mmol/L (ref 22–32)
Calcium: 9 mg/dL (ref 8.9–10.3)
Chloride: 107 mmol/L (ref 101–111)
Creatinine, Ser: 0.86 mg/dL (ref 0.44–1.00)
GFR calc Af Amer: >60 mL/min (ref >60)
GFR calc non Af Amer: >60 mL/min (ref >60)
Glucose, Bld: 149 mg/dL — ABNORMAL HIGH (ref 65–99)
Potassium: 3.6 mmol/L (ref 3.5–5.1)
Sodium: 139 mmol/L (ref 135–145)
Total Bilirubin: 0.4 mg/dL (ref 0.3–1.2)
Total Protein: 7.1 g/dL (ref 6.5–8.1)

## 2015-04-07 LAB — I-STAT BETA HCG BLOOD, ED (MC, WL, AP ONLY): I-stat hCG, quantitative: <5 m[IU]/mL (ref <5)

## 2015-04-07 LAB — URINALYSIS, ROUTINE W REFLEX MICROSCOPIC
Bilirubin Urine: NEGATIVE
Glucose, UA: NEGATIVE mg/dL
Ketones, ur: NEGATIVE mg/dL
Leukocytes, UA: NEGATIVE
Nitrite: NEGATIVE
Protein, ur: NEGATIVE mg/dL
Specific Gravity, Urine: 1.008 (ref 1.005–1.030)
Urobilinogen, UA: 0.2 mg/dL (ref 0.0–1.0)
pH: 6 (ref 5.0–8.0)

## 2015-04-07 LAB — LIPASE, BLOOD: Lipase: 24 U/L (ref 22–51)

## 2015-04-07 LAB — URINE MICROSCOPIC-ADD ON

## 2015-04-07 MED ORDER — PROMETHAZINE HCL 25 MG PO TABS: 25.0000 mg | ORAL_TABLET | Freq: Three times a day (TID) | ORAL | Status: DC | PRN

## 2015-04-07 MED ORDER — CIPROFLOXACIN HCL 500 MG PO TABS: 500.0000 mg | ORAL_TABLET | Freq: Two times a day (BID) | ORAL | Status: DC

## 2015-04-07 MED ORDER — SODIUM CHLORIDE 0.9 % IV BOLUS (SEPSIS)
1000.0000 mL | Freq: Once | INTRAVENOUS | Status: AC
  Administered 2015-04-07: 1000 mL via INTRAVENOUS

## 2015-04-07 MED ORDER — FENTANYL CITRATE (PF) 100 MCG/2ML IJ SOLN
100.0000 ug | Freq: Once | INTRAMUSCULAR | Status: AC
  Administered 2015-04-07: 100 ug via INTRAVENOUS
  Filled 2015-04-07: qty 2

## 2015-04-07 MED ORDER — PROMETHAZINE HCL 25 MG/ML IJ SOLN
25.0000 mg | Freq: Once | INTRAMUSCULAR | Status: AC
  Administered 2015-04-07: 25 mg via INTRAVENOUS
  Filled 2015-04-07: qty 1

## 2015-04-07 NOTE — ED Notes (Signed)
Awake. Verbally responsive. Resp even and unlabored. No audible adventitious breath sounds noted. ABC's intact. Abd soft/nondistended but tender to palpate. BS (+) and active x4 quadrants. No N/V/D reported. IV infusing NS at 999ml/hr without difficulty. 

## 2015-04-07 NOTE — ED Notes (Signed)
Awake. Verbally responsive. Resp even and unlabored. No audible adventitious breath sounds noted. ABC's intact.  No N/V/D reported. IV saline lock patent and intact. Pt ambulating to BR with steady gait.

## 2015-04-07 NOTE — ED Notes (Signed)
Awake. Verbally responsive. Resp even and unlabored. No audible adventitious breath sounds noted. ABC's intact. No N/V/D reported. IV saline lock patent and intact. 

## 2015-04-07 NOTE — ED Notes (Signed)
Awake. Verbally responsive. Resp even and unlabored. No audible adventitious breath sounds noted. ABC's intact.  No N/V/D reported. IV saline lock patent and intact. Pt ambulating to BR with steady gait. 

## 2015-04-07 NOTE — ED Notes (Addendum)
Pt reported generalized discomfort. Pt reported urinary frequency/urgency, bil flank pain, lower abd pain, denies dysuria, hematuria and foul odor urine.

## 2015-04-07 NOTE — ED Provider Notes (Signed)
CSN: [id number]     Arrival date & time 04/07/15  1327 History   First MD Initiated Contact with Patient 04/07/15 1502     Chief Complaint  Patient presents with  . Flank Pain  . Chills  . Nausea     (Consider location/radiation/quality/duration/timing/severity/associated sxs/prior Treatment) HPI Patient presents to the emergency department with generalized discomfort with urinary frequency.  The patient, states she is having bilateral flank pain and lower abdominal pain, states she was seen here Friday.  There is a CT scan that showed she had right pyelonephritis.  Patient states that she was seen by her primary doctor and given ciprofloxacin, which she started taking prior to the visit on Friday.  Patient states that she has had some vomiting with nausea.  The patient denies chest pain, shortness of breath, weakness, dizziness, headache, blurred vision, cough, incontinence, bloody stool, hematemesis, anorexia, or syncope.  The patient states that she did not take any other medications prior to arrival Past Medical History  Diagnosis Date  . Palpitations   . MVP (mitral valve prolapse)     echo in 2012 did not show MVP but did have mild elongation of the AMVL  . Allergy   . Anxiety   . Arthritis   . Depression   . Hyperlipidemia   . Tachycardia   . Intestinal malrotation   . MVP (mitral valve prolapse)     diagnosed as teen and now has cleared  . Hypertension 05/2013   Past Surgical History  Procedure Laterality Date  . Intestinal malrotation repair  1999  . Tubal ligation    . Appendectomy    . Colon surgery    . Colposcopy  2006    LGSIL, normal since  . Excision morton's neuroma Bilateral 2002   Family History  Problem Relation Age of Onset  . Heart disease Mother   . Hypertension Mother   . Aneurysm Father 36    brain   . Hypertension Sister   . Heart disease Maternal Grandmother   . Cancer Paternal Grandmother   . Cancer Paternal Grandfather    History    Substance Use Topics  . Smoking status: Former Games developermoker  . Smokeless tobacco: Never Used     Comment: quit 1 year ago, sep 2013  . Alcohol Use: No     Comment: occasionally   OB History    Gravida Para Term Preterm AB TAB SAB Ectopic Multiple Living   1 1 1       1      Review of Systems  All other systems negative except as documented in the HPI. All pertinent positives and negatives as reviewed in the HPI.  Allergies  Imitrex; Iohexol; Bupropion; Prozac; Penicillins; Sulfa antibiotics; and Zoloft  Home Medications   Prior to Admission medications   Medication Sig Start Date End Date Taking? Authorizing Provider  ibuprofen (ADVIL,MOTRIN) 200 MG tablet Take 600 mg by mouth every 6 (six) hours as needed.   Yes Historical Provider, MD  loratadine (CLARITIN) 10 MG tablet Take 1 tablet (10 mg total) by mouth daily. Patient taking differently: Take 10 mg by mouth daily as needed for allergies.  12/31/14  Yes Wallis BambergMario Mani, PA-C  cephALEXin (KEFLEX) 500 MG capsule Take 1 capsule (500 mg total) by mouth 3 (three) times daily. Patient not taking: Reported on 04/07/2015 04/05/15   Doug SouSam Jacubowitz, MD  doxycycline (VIBRAMYCIN) 100 MG capsule Take 1 capsule (100 mg total) by mouth 2 (two) times daily. Patient not  taking: Reported on 04/05/2015 12/31/14   Wallis Bamberg, PA-C  fluticasone [facility]) 50 MCG/ACT nasal spray Place 2 sprays into both nostrils daily. Patient not taking: Reported on 04/05/2015 12/31/14   Wallis Bamberg, PA-C  HYDROcodone-homatropine [facility]) 5-1.5 MG/5ML syrup Take 5 mLs by mouth at bedtime as needed. Patient not taking: Reported on 04/05/2015 12/31/14   Wallis Bamberg, PA-C  metoCLOPramide (REGLAN) 10 MG tablet Take 1 tablet (10 mg total) by mouth every 6 (six) hours as needed for nausea or vomiting. Patient not taking: Reported on 04/07/2015 04/05/15   Doug Sou, MD  pseudoephedrine (SUDAFED 12 HOUR) 120 MG 12 hr tablet Take 1 tablet (120 mg total) by mouth 2 (two) times  daily. Patient not taking: Reported on 04/05/2015 12/31/14   Wallis Bamberg, PA-C   BP 138/72 mmHg  Pulse 78  Temp(Src) 99.7 F (37.6 C) (Rectal)  Resp 14  SpO2 100%  LMP 03/21/2015 (Approximate) Physical Exam  Constitutional: She is oriented to person, place, and time. She appears well-developed and well-nourished. No distress.  HENT:  Head: Normocephalic and atraumatic.  Mouth/Throat: Oropharynx is clear and moist.  Eyes: Pupils are equal, round, and reactive to light.  Neck: Normal range of motion. Neck supple.  Cardiovascular: Normal rate, regular rhythm and normal heart sounds.  Exam reveals no gallop and no friction rub.   No murmur heard. Pulmonary/Chest: Effort normal and breath sounds normal. No respiratory distress.  Abdominal: Soft. Normal appearance and bowel sounds are normal. She exhibits no distension. There is tenderness. There is no rebound and no guarding.    Neurological: She is alert and oriented to person, place, and time. She exhibits normal muscle tone. Coordination normal.  Skin: Skin is warm and dry. No rash noted. No erythema.  Nursing note and vitals reviewed.   ED Course  Procedures (including critical care time) Labs Review Labs Reviewed  COMPREHENSIVE METABOLIC PANEL - Abnormal; Notable for the following:    Glucose, Bld 149 (*)    All other components within normal limits  URINALYSIS, ROUTINE W REFLEX MICROSCOPIC (NOT AT [facility]) - Abnormal; Notable for the following:    Hgb urine dipstick MODERATE (*)    All other components within normal limits  URINE MICROSCOPIC-ADD ON - Abnormal; Notable for the following:    Squamous Epithelial / LPF MANY (*)    All other components within normal limits  LIPASE, BLOOD  CBC  I-STAT BETA HCG BLOOD, ED ([facility], [facility], [facility] ONLY)    The patient has been stable here in the emergency department.  She has not had any vomiting.  She is tolerated oral fluids.  I feel that the patient can be discharged home with.  Patient states  she does feel some better and would like to be discharged home    Charlestine Night, [place] 04/07/15 1943  Linwood Dibbles, MD 04/07/15 1946

## 2015-04-07 NOTE — ED Notes (Signed)
Pt reports being seen on Friday and Dx with bad kidney infection by CT scan. Given fluids and Rx, but pharmacy would not fill Rx for Keflex due to PCN allergy on file. Pt sts she had cipro at home that she began taking and has had 2 doses but feeling worse with abd and R flank pain, chills, nausea.

## 2015-04-07 NOTE — Progress Notes (Signed)
Based on what you shared with me it looks like you have a serious condition that should be evaluated in a face to face office visit.  If you are having a true medical emergency please call 911.  If you need an urgent face to face visit, [facility] has four urgent care centers for your convenience.  . [facility]  [phone] Get Driving Directions Find a Provider at this Location  [address] [place], [place] [address] . 8 am to 8 pm Monday-Friday . 9 am to 7 pm Saturday-Sunday  . [facility]  [phone] Get Driving Directions Find a Provider at this Location  [address], [address] , [place] [address] . 8 am to 8 pm Monday-Friday . 9 am to 6 pm Saturday . 11 am to 6 pm Sunday   . [facility] [place]  [phone] Get Driving Directions  [address] [place], [place] [address] . 8 am to 8 pm Monday-Friday . 9 am to 4 pm Saturday-Sunday   . [facility] (a walk in primary care provider)  [phone]  Get Driving Directions Find a Provider at this Location  [address] [place], [place] [address] . 8 am to 8:30 pm Monday-Thursday . 8 am to 6 pm Friday . 8 am to 4 pm Saturday-Sunday   Your e-visit answers were reviewed by a board certified advanced clinical practitioner to complete your personal care plan.  Depending on the condition, your plan could have included both over the counter or prescription medications.  You will get an e-mail in the next two days asking about your experience.  I hope that your e-visit has been valuable and will speed your recovery . Thank you for choosing an e-visit.    

## 2015-04-08 ENCOUNTER — Other Ambulatory Visit: Payer: 59

## 2015-04-08 ENCOUNTER — Encounter (HOSPITAL_BASED_OUTPATIENT_CLINIC_OR_DEPARTMENT_OTHER): Payer: Self-pay | Admitting: Emergency Medicine

## 2015-04-10 ENCOUNTER — Ambulatory Visit (INDEPENDENT_AMBULATORY_CARE_PROVIDER_SITE_OTHER): Payer: 59

## 2015-04-10 ENCOUNTER — Ambulatory Visit (INDEPENDENT_AMBULATORY_CARE_PROVIDER_SITE_OTHER): Payer: 59 | Admitting: Family Medicine

## 2015-04-10 ENCOUNTER — Encounter: Payer: Self-pay | Admitting: Family Medicine

## 2015-04-10 VITALS — BP 132/92 | HR 104 | Temp 98.3°F | Resp 16 | Ht 68.75 in | Wt 166.0 lb

## 2015-04-10 DIAGNOSIS — R1030 Lower abdominal pain, unspecified: Secondary | ICD-10-CM

## 2015-04-10 DIAGNOSIS — B3731 Acute candidiasis of vulva and vagina: Secondary | ICD-10-CM

## 2015-04-10 DIAGNOSIS — R300 Dysuria: Secondary | ICD-10-CM

## 2015-04-10 DIAGNOSIS — R002 Palpitations: Secondary | ICD-10-CM | POA: Diagnosis not present

## 2015-04-10 DIAGNOSIS — B373 Candidiasis of vulva and vagina: Secondary | ICD-10-CM

## 2015-04-10 DIAGNOSIS — R5381 Other malaise: Secondary | ICD-10-CM

## 2015-04-10 DIAGNOSIS — R350 Frequency of micturition: Secondary | ICD-10-CM

## 2015-04-10 DIAGNOSIS — R5383 Other fatigue: Secondary | ICD-10-CM | POA: Diagnosis not present

## 2015-04-10 LAB — POCT URINALYSIS DIPSTICK
Bilirubin, UA: NEGATIVE
Glucose, UA: 100
Ketones, UA: NEGATIVE
Leukocytes, UA: NEGATIVE
Nitrite, UA: POSITIVE
Protein, UA: NEGATIVE
Spec Grav, UA: <=1.005
Urobilinogen, UA: 0.2
pH, UA: 5.5

## 2015-04-10 LAB — CBC WITH DIFFERENTIAL/PLATELET
Basophils Absolute: 0 10*3/uL (ref 0.0–0.1)
Basophils Relative: 0 % (ref 0–1)
Eosinophils Absolute: 0.1 10*3/uL (ref 0.0–0.7)
Eosinophils Relative: 1 % (ref 0–5)
HCT: 41.5 % (ref 36.0–46.0)
Hemoglobin: 14.1 g/dL (ref 12.0–15.0)
Lymphocytes Relative: 22 % (ref 12–46)
Lymphs Abs: 1.7 10*3/uL (ref 0.7–4.0)
MCH: 29.6 pg (ref 26.0–34.0)
MCHC: 34 g/dL (ref 30.0–36.0)
MCV: 87.2 fL (ref 78.0–100.0)
MPV: 10.3 fL (ref 8.6–12.4)
Monocytes Absolute: 0.4 10*3/uL (ref 0.1–1.0)
Monocytes Relative: 5 % (ref 3–12)
Neutro Abs: 5.7 10*3/uL (ref 1.7–7.7)
Neutrophils Relative %: 72 % (ref 43–77)
Platelets: 263 10*3/uL (ref 150–400)
RBC: 4.76 MIL/uL (ref 3.87–5.11)
RDW: 13.2 % (ref 11.5–15.5)
WBC: 7.9 10*3/uL (ref 4.0–10.5)

## 2015-04-10 LAB — POCT WET PREP WITH KOH
KOH Prep POC: POSITIVE
Trichomonas, UA: NEGATIVE
Yeast Wet Prep HPF POC: POSITIVE

## 2015-04-10 LAB — POCT UA - MICROSCOPIC ONLY
Casts, Ur, LPF, POC: NEGATIVE
Crystals, Ur, HPF, POC: NEGATIVE
Mucus, UA: NEGATIVE
Yeast, UA: NEGATIVE

## 2015-04-10 MED ORDER — FLUCONAZOLE 150 MG PO TABS: 150.0000 mg | ORAL_TABLET | Freq: Once | ORAL | Status: DC

## 2015-04-10 NOTE — Progress Notes (Signed)
Subjective:    Patient ID: Victoria Barr, female    DOB: 11/22/1970, 44 y.o.   MRN: [id number]  04/10/2015  Pyelonephritis; Palpitations; and Abdominal Pain   HPI This 44 y.o. female presents for TRANSITION INTO CARE for recent ED visit.  Ten days ago, awoke with back pain; called out of work; went to [employer]; severe back pain; have a back issue; requested shot of Toradol; cannot see back specialist unless later in the week; found blood in urine but did not feel that she had any other issue; s/p pelvic; prescribed cream for vaginitis.  Evaluated by orthopedist; ordered CT scan on Friday; that evening, called that CT showed pyelonephritis; sent to ED; prescribed abx and ivf.  Feels better; left hospital. Urine was negative except for blood; prescribed Cipro 500mg  bid.  Had an OK day for 24 hours; at 3:#0am in morning, had nausea, diarrhea, palpitations.  Presented back to ED, tachycardia, 99.8 fever.  Rectally temperature was higher.  Prescribed Phenergan, Fentanyl, ivf.  Urine negative; sent home; continue Cipro.  Monday feeling better; Tuesday not feeling good; suffering with really bad pain lower abdomen.  Still having pain with urination; recommended starting AZO. Awoke this morning; had breakfast, tachycardia 105 and flushed/sweats, nausea, diarrhea.   Gets dizzy with heart palpitations.  Same symptoms as with Saturday.  History of intestinal surgery; malrotation of intestines; s/p corrective surgery in 1990.  Removed appendix during surgery.    Fever Tmax 99.8 rectally.  +sweats; +flushing; No recent chills.  Shaking on Sunday.  Mild headache.  No vomiting; +nauseated for past four days.  +diarrhea loose stool; not watery; non-bloody; non-mucous stools.  Diarrhea x 3 per day; baseline bm. X 1.  +abdominal pain; really tender in suprapubic region but pain moves. Bloated and feels full.  No abx other than last week.  No recent travel; in June in [place].  No unusual foods. No appetite.  Not drinking  well.  +dysuria inside and out; might have yeast infection.  Urinary frequency; excessive urination; large amounts of urine; +urgency; no incontinence; +nocturia x 0.  No vaginal discharge.  +sexually active; same partner x 7 years; no STDs.  +vaginla burning this morning and mild itching today only.    No cough; no sore throat, ear pain, rhinorrhea, nasal congestion.  No rash.  No tick bites.  Previous urology evaluation in past due to hematuria, recurrent UTIs; cath in office; no cystoscopy.  Not taking anything for pain.    Review of Systems  Constitutional: Negative for fever, chills, diaphoresis and fatigue.  HENT: Negative for congestion, ear pain, postnasal drip, rhinorrhea, sinus pressure and sore throat.   Eyes: Negative for visual disturbance.  Respiratory: Negative for cough and shortness of breath.   Cardiovascular: Positive for palpitations. Negative for chest pain and leg swelling.  Gastrointestinal: Positive for nausea, abdominal pain and diarrhea. Negative for vomiting, constipation, blood in stool, abdominal distention, anal bleeding and rectal pain.  Endocrine: Negative for cold intolerance, heat intolerance, polydipsia, polyphagia and polyuria.  Genitourinary: Positive for dysuria, urgency, frequency, flank pain, vaginal discharge and pelvic pain. Negative for hematuria, decreased urine volume, vaginal bleeding, genital sores, vaginal pain and menstrual problem.  Neurological: Negative for dizziness, tremors, seizures, syncope, facial asymmetry, speech difficulty, weakness, light-headedness, numbness and headaches.    Past Medical History  Diagnosis Date  . Palpitations   . MVP (mitral valve prolapse)     echo in 2012 did not show MVP but did have mild elongation  of the AMVL  . Allergy   . Anxiety   . Arthritis   . Depression   . Hyperlipidemia   . Tachycardia   . Intestinal malrotation   . MVP (mitral valve prolapse)     diagnosed as teen and now has cleared  .  Hypertension 05/2013   Past Surgical History  Procedure Laterality Date  . Intestinal malrotation repair  1999  . Tubal ligation    . Appendectomy    . Colon surgery    . Colposcopy  2006    LGSIL, normal since  . Excision morton's neuroma Bilateral 2002   Allergies  Allergen Reactions  . Imitrex [Sumatriptan] Hives and Anaphylaxis  . Iohexol Hives    Patient must pre medicated if having an exam with contrast per Dr. Maxwell// rls  . Bupropion Hives  . Prozac [Fluoxetine Hcl] Hives  . Penicillins Hives  . Sulfa Antibiotics Hives  . Zoloft [Sertraline Hcl] Hives    Social History   Social History  . Marital Status: Single    Spouse Name: N/A  . Number of Children: N/A  . Years of Education: N/A   Occupational History  . Not on file.   Social History Main Topics  . Smoking status: Former Games developer  . Smokeless tobacco: Never Used     Comment: quit 1 year ago, sep 2013  . Alcohol Use: No     Comment: occasionally  . Drug Use: No  . Sexual Activity: Not Currently    Birth Control/ Protection: Surgical     Comment: BTL   Other Topics Concern  . Not on file   Social History Narrative   Family History  Problem Relation Age of Onset  . Heart disease Mother   . Hypertension Mother   . Aneurysm Father 36    brain   . Hypertension Sister   . Heart disease Maternal Grandmother   . Cancer Paternal Grandmother   . Cancer Paternal Grandfather        Objective:    BP 132/92 mmHg  Pulse 104  Temp(Src) 98.3 F (36.8 C) (Oral)  Resp 16  Ht 5' 8.75" (1.746 m)  Wt 166 lb (75.297 kg)  BMI 24.70 kg/m2  SpO2 98%  LMP 03/21/2015 (Approximate) Physical Exam  Constitutional: She is oriented to person, place, and time. She appears well-developed and well-nourished. No distress.  HENT:  Head: Normocephalic and atraumatic.  Right Ear: External ear normal.  Left Ear: External ear normal.  Nose: Nose normal.  Mouth/Throat: Oropharynx is clear and moist.  Eyes:  Conjunctivae and EOM are normal. Pupils are equal, round, and reactive to light.  Neck: Normal range of motion. Neck supple. Carotid bruit is not present. No thyromegaly present.  Cardiovascular: Normal rate, regular rhythm, normal heart sounds and intact distal pulses.  Exam reveals no gallop and no friction rub.   No murmur heard. Pulmonary/Chest: Effort normal and breath sounds normal. She has no wheezes. She has no rales.  Abdominal: Soft. Bowel sounds are normal. She exhibits no distension and no mass. There is no hepatosplenomegaly. There is tenderness in the suprapubic area. There is no rigidity, no rebound, no guarding and no CVA tenderness. No hernia.  Genitourinary: Uterus normal. There is no rash, tenderness or lesion on the right labia. There is no rash, tenderness or lesion on the left labia. Uterus is not enlarged and not tender. Cervix exhibits no motion tenderness, no discharge and no friability. Right adnexum displays no mass, no tenderness  and no fullness. No erythema, tenderness or bleeding in the vagina. No foreign body around the vagina. Vaginal discharge found.  Lymphadenopathy:    She has no cervical adenopathy.  Neurological: She is alert and oriented to person, place, and time. No cranial nerve deficit.  Skin: Skin is warm and dry. No rash noted. She is not diaphoretic. No erythema. No pallor.  Psychiatric: She has a normal mood and affect. Her behavior is normal.    [facility] reading (PRIMARY) by  Dr. Katrinka BlazingSmith. AAS: NO FREE AIR; NO OBSTRUCTION; RESIDUAL CONTRAST IN DISTAL COLON.      Assessment & Plan:   1. Dysuria   2. Urinary frequency   3. Palpitations   4. Lower abdominal pain   5. Malaise and fatigue    1.  Dysuria; persistent yet urine cultures negative; will repeat urine culture again today though expect low yield; obtain GC/Chlam. 2.  Urinary frequency: persistent; refer to urology.  Repeat urine cultures negative; sugar is normal.   3.  Palpitations: New;  associated with acute illness; no concerning tachycardia within the past two weeks.   4.  Lower abdominal pain: New.  Associated with dysuria, urinary frequency; s/p AAS in office that is negative; s/p extensive work up and imaging.  Normal CBC and urine cultures; obtain GC/Chlam.  Now with diarrhea and nausea; cannot rule out contributing viral process; benign abdominal exam today.  If symptoms persist beyond the next 72 hours, obtain CT urogram to evaluate for kidney stone; pt desires to wait due to feeling some better today.  S/p CT abd/pelvis with contrast on 04/05/2015. 5.  Malaise and fatigue: Ongoing; recommend supportive care with rest, fluids, BRAT diet, Tylenol. Obtain labs. 6. Candidiasis vulvovaginal: New.  Rx for Diflucan provided.   Orders Placed This Encounter  Procedures  . Urine culture  . GC/Chlamydia Probe Amp  . DG Abd Acute W/Chest    Standing Status: Future     Number of Occurrences: 1     Standing Expiration Date: 06/10/2016    Order Specific Question:  Reason for Exam (SYMPTOM  OR DIAGNOSIS REQUIRED)    Answer:  lower abdominal pain    Order Specific Question:  Is the patient pregnant?    Answer:  No    Order Specific Question:  Preferred imaging location?    Answer:  External  . CBC with Differential/Platelet  . Comprehensive metabolic panel  . Ambulatory referral to Urology    Referral Priority:  Urgent    Referral Type:  Consultation    Referral Reason:  Specialty Services Required    Requested Specialty:  Urology    Number of Visits Requested:  1  . POCT urinalysis dipstick  . POCT UA - Microscopic Only  . POCT Wet Prep with KOH  . POCT urinalysis dipstick    Standing Status: Future     Number of Occurrences: 1     Standing Expiration Date: 06/22/2015  . POCT UA - Microscopic Only    Standing Status: Future     Number of Occurrences: 1     Standing Expiration Date: 06/22/2015    Meds ordered this encounter  Medications  . DISCONTD: fluconazole  (DIFLUCAN) 150 MG tablet    Sig: Take 1 tablet (150 mg total) by mouth once. Repeat if needed    Dispense:  2 tablet    Refill:  0    No Follow-up on file.    Kristi Paulita FujitaMartin Smith, M.D. [facility]  [facility] [address] [place], [place]  [id number] [phone] phone [phone] fax

## 2015-04-11 LAB — COMPREHENSIVE METABOLIC PANEL
ALT: 15 U/L (ref 0–35)
AST: 14 U/L (ref 0–37)
Albumin: 4.1 g/dL (ref 3.5–5.2)
Alkaline Phosphatase: 88 U/L (ref 39–117)
BUN: 13 mg/dL (ref 6–23)
CO2: 22 mEq/L (ref 19–32)
Calcium: 9.8 mg/dL (ref 8.4–10.5)
Chloride: 106 mEq/L (ref 96–112)
Creat: 0.89 mg/dL (ref 0.50–1.10)
Glucose, Bld: 103 mg/dL — ABNORMAL HIGH (ref 70–99)
Potassium: 4.3 mEq/L (ref 3.5–5.3)
Sodium: 140 mEq/L (ref 135–145)
Total Bilirubin: 0.2 mg/dL (ref 0.2–1.2)
Total Protein: 7.2 g/dL (ref 6.0–8.3)

## 2015-04-11 LAB — GC/CHLAMYDIA PROBE AMP
CT Probe RNA: NEGATIVE
GC Probe RNA: NEGATIVE

## 2015-04-12 ENCOUNTER — Telehealth: Payer: Self-pay

## 2015-04-12 LAB — URINE CULTURE
Colony Count: NO GROWTH
Organism ID, Bacteria: NO GROWTH

## 2015-04-12 NOTE — Telephone Encounter (Signed)
Dondra Spry with [employer] called into Disabilities voicemail on Thursday 04/11/15 at 11:53am wanting to let us know she was faxing over forms to be filled out and completed on this patient by Dr. Katrinka Blazing. We have received these forms, I have filled out what I can and highlighted the areas that need review, I will be placing them in Dr. Michaelle Copas box today (7/22) please return to the FMLA/Disabilities box at checkout at 102 within 5-7 business days.

## 2015-04-17 NOTE — Telephone Encounter (Signed)
Forms completed and provided to [facility] of disabilities to process.  Clinical staff --- please call pt for an update. How is she feeling?

## 2015-04-23 NOTE — Telephone Encounter (Signed)
Spoke with patient; much improved.

## 2015-04-25 ENCOUNTER — Encounter: Payer: Self-pay | Admitting: Urgent Care

## 2015-04-25 ENCOUNTER — Ambulatory Visit (INDEPENDENT_AMBULATORY_CARE_PROVIDER_SITE_OTHER): Payer: 59 | Admitting: Urgent Care

## 2015-04-25 VITALS — BP 135/79 | HR 107 | Temp 98.2°F | Resp 20 | Ht 68.5 in | Wt 166.0 lb

## 2015-04-25 DIAGNOSIS — R1030 Lower abdominal pain, unspecified: Secondary | ICD-10-CM

## 2015-04-25 DIAGNOSIS — J029 Acute pharyngitis, unspecified: Secondary | ICD-10-CM | POA: Diagnosis not present

## 2015-04-25 DIAGNOSIS — R350 Frequency of micturition: Secondary | ICD-10-CM | POA: Diagnosis not present

## 2015-04-25 DIAGNOSIS — R300 Dysuria: Secondary | ICD-10-CM

## 2015-04-25 DIAGNOSIS — J019 Acute sinusitis, unspecified: Secondary | ICD-10-CM | POA: Diagnosis not present

## 2015-04-25 LAB — POCT UA - MICROSCOPIC ONLY
Casts, Ur, LPF, POC: NEGATIVE
Crystals, Ur, HPF, POC: NEGATIVE
Mucus, UA: NEGATIVE
WBC, Ur, HPF, POC: NEGATIVE
Yeast, UA: NEGATIVE

## 2015-04-25 LAB — POCT URINALYSIS DIPSTICK
Bilirubin, UA: NEGATIVE
Glucose, UA: NEGATIVE
Ketones, UA: NEGATIVE
Leukocytes, UA: NEGATIVE
Nitrite, UA: NEGATIVE
Protein, UA: NEGATIVE
Spec Grav, UA: 1.025
Urobilinogen, UA: 0.2
pH, UA: 5.5

## 2015-04-25 LAB — POCT RAPID STREP A (OFFICE): Rapid Strep A Screen: NEGATIVE

## 2015-04-25 MED ORDER — DOXYCYCLINE HYCLATE 100 MG PO CAPS: 100.0000 mg | ORAL_CAPSULE | Freq: Two times a day (BID) | ORAL | Status: DC

## 2015-04-25 NOTE — Patient Instructions (Signed)

## 2015-04-25 NOTE — Progress Notes (Signed)
MRN: [id number] DOB: 1971/04/07  Subjective:   Victoria Barr is a 44 y.o. female presenting for follow up on UTI. Reports 4 day history of sore throat. Feeling feverish, left ear pain, left sided sinus pressure, congestion, slight non-productive cough.  Has tried Vitamin C, NyQuil, Flonase with minimal improvement. Denies itchy or watery red eyes, ear drainage, sore throat, chest pain, shob, wheezing, chest tightness, n/v, abdominal pain. Has a 42 year old, spent time at a birthday party and started feeling sick thereafter. Denies smoking. Denies any other aggravating or relieving factors, no other questions or concerns.  [name] has a current medication list which includes the following prescription(s): fluticasone and ibuprofen. She is allergic to imitrex; iohexol; bupropion; prozac; penicillins; sulfa antibiotics; and zoloft.  [name]  has a past medical history of Palpitations; MVP (mitral valve prolapse); Allergy; Anxiety; Arthritis; Depression; Hyperlipidemia; Tachycardia; Intestinal malrotation; MVP (mitral valve prolapse); and Hypertension (05/2013). Also  has past surgical history that includes Intestinal malrotation repair (1999); Tubal ligation; Appendectomy; Colon surgery; Colposcopy (2006); and Excision Morton's neuroma (Bilateral, 2002).  ROS As in subjective.  Objective:   Vitals: BP 135/79 mmHg  Pulse 107  Temp(Src) 98.2 F (36.8 C)  Resp 20  Ht 5' 8.5" (1.74 m)  Wt 166 lb (75.297 kg)  BMI 24.87 kg/m2  LMP 03/21/2015 (Approximate)  Physical Exam  Constitutional: She is oriented to person, place, and time. She appears well-developed and well-nourished.  HENT:  Left ear effusion but without effusions or erythema. Left nasal turbinates inflamed with yellow mucus. Left maxillary sinus tenderness. Significant postnasal drip present, without oropharyngeal exudates, erythema or abscesses.  Eyes: Conjunctivae are normal. Pupils are equal, round, and reactive to light. Right  eye exhibits no discharge. Left eye exhibits no discharge. No scleral icterus.  Neck: Normal range of motion. Neck supple.  Cardiovascular: Normal rate, regular rhythm and intact distal pulses.  Exam reveals no gallop and no friction rub.   No murmur heard. Pulmonary/Chest: No stridor. No respiratory distress. She has no wheezes. She has no rales.  Abdominal: Soft. Bowel sounds are normal. She exhibits no distension and no mass. There is no tenderness.  Lymphadenopathy:    She has cervical adenopathy (left-sided, anterior).  Neurological: She is alert and oriented to person, place, and time.  Skin: Skin is warm and dry. No rash noted. No erythema. No pallor.    Results for orders placed or performed in visit on 04/25/15 (from the past 24 hour(s))  POCT urinalysis dipstick     Status: None   Collection Time: 04/25/15  4:09 PM  Result Value Ref Range   Color, UA yellow    Clarity, UA cloudy    Glucose, UA neg    Bilirubin, UA neg    Ketones, UA neg    Spec Grav, UA 1.025    Blood, UA trace    pH, UA 5.5    Protein, UA neg    Urobilinogen, UA 0.2    Nitrite, UA neg    Leukocytes, UA Negative Negative  POCT UA - Microscopic Only     Status: None   Collection Time: 04/25/15  4:10 PM  Result Value Ref Range   WBC, Ur, HPF, POC neg    RBC, urine, microscopic 0-1    Bacteria, U Microscopic trace    Mucus, UA neg    Epithelial cells, urine per micros 1-3    Crystals, Ur, HPF, POC neg    Casts, Ur, LPF, POC neg  Yeast, UA neg     Assessment and Plan :   1. Dysuria 2. Urinary frequency - Significantly improved, patient finished antibiotic course. Follow up as needed  3. Lower abdominal pain 4. Acute sinusitis, recurrence not specified, unspecified location 5. Sore throat - Will cover for sinusitis, rtc if no improvement despite antibiotic course  Wallis Bamberg, PA-C [facility] [phone] 04/25/2015 4:09 PM

## 2015-04-27 LAB — CULTURE, GROUP A STREP: Organism ID, Bacteria: NORMAL

## 2015-05-16 ENCOUNTER — Ambulatory Visit: Payer: 59 | Admitting: Internal Medicine

## 2015-05-23 ENCOUNTER — Ambulatory Visit (INDEPENDENT_AMBULATORY_CARE_PROVIDER_SITE_OTHER): Payer: 59 | Admitting: Physician Assistant

## 2015-05-23 ENCOUNTER — Encounter: Payer: Self-pay | Admitting: Physician Assistant

## 2015-05-23 VITALS — BP 108/88 | HR 77 | Temp 98.4°F | Resp 16 | Ht 67.25 in | Wt 167.0 lb

## 2015-05-23 DIAGNOSIS — F32A Depression, unspecified: Secondary | ICD-10-CM

## 2015-05-23 DIAGNOSIS — E559 Vitamin D deficiency, unspecified: Secondary | ICD-10-CM | POA: Insufficient documentation

## 2015-05-23 DIAGNOSIS — J328 Other chronic sinusitis: Secondary | ICD-10-CM

## 2015-05-23 DIAGNOSIS — J029 Acute pharyngitis, unspecified: Secondary | ICD-10-CM

## 2015-05-23 DIAGNOSIS — F329 Major depressive disorder, single episode, unspecified: Secondary | ICD-10-CM

## 2015-05-23 LAB — POCT CBC
Granulocyte percent: 61.8 %G (ref 37–80)
HCT, POC: 43.1 % (ref 37.7–47.9)
Hemoglobin: 14.2 g/dL (ref 12.2–16.2)
Lymph, poc: 1.7 (ref 0.6–3.4)
MCH, POC: 28.5 pg (ref 27–31.2)
MCHC: 33.1 g/dL (ref 31.8–35.4)
MCV: 86.2 fL (ref 80–97)
MID (cbc): 0.2 (ref 0–0.9)
MPV: 7.8 fL (ref 0–99.8)
POC Granulocyte: 3 (ref 2–6.9)
POC LYMPH PERCENT: 34.6 %L (ref 10–50)
POC MID %: 3.6 %M (ref 0–12)
Platelet Count, POC: 295 10*3/uL (ref 142–424)
RBC: 5 M/uL (ref 4.04–5.48)
RDW, POC: 12.3 %
WBC: 4.8 10*3/uL (ref 4.6–10.2)

## 2015-05-23 LAB — POCT RAPID STREP A (OFFICE): Rapid Strep A Screen: NEGATIVE

## 2015-05-23 MED ORDER — MONTELUKAST SODIUM 10 MG PO TABS: 10.0000 mg | ORAL_TABLET | Freq: Every day | ORAL | Status: DC

## 2015-05-23 MED ORDER — AZITHROMYCIN 250 MG PO TABS: ORAL_TABLET | ORAL | Status: DC

## 2015-05-23 NOTE — Progress Notes (Signed)
Patient ID: Victoria Barr, female    DOB: [birth year], 44 y.o.   MRN: [id number]  PCP: Carmelina Dane, MD  Subjective:   Chief Complaint  Patient presents with  . Sore Throat    PER PATIENT WITH LUMP IN THROAT x 3 weeks, flu vaccine at work [employer]  . Cough    NON PRODUCTIVE    HPI Presents for evaluation of sore throat, lump on the RIGHT and cough.  Seen here 04/25/2015 with these symptoms, prescribed doxycycline for possible sinusitis. Doesn't feel like she ever got better. Since then, she's been exposed to several colleagues with strep throat.  She describes chronic RIGHT sided sinus congestion, pressure and drainage. She notes swelling and tenderness on the RIGHT side of her neck a few times each year. Previous sinus surgery and correction of deviated nasal septum. She was allergy tested "years and years ago" but doesn't recall what she reacted to. Did not develop a good rapport with her ENT, so stopped going.  Doesn't take an oral antihistamine regularly. Uses the steroid nasal spray "when I remember" and isn't sure that it makes any difference. Has never tried Singulair or Atrovent NS.  "There's one other thing." Tired. Depressed. "Can't sleep. My brain just won't turn off. 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 She found her mother dead at age 77. "I guess I could still have PTSD, even though it's been 10 years." Tried melatonin. Has thought about engaging EAP through work. History of low vitamin D without improvement on supplementation. Normal TSH in 01/2015.  Review of Systems As above.    Patient Active Problem List   Diagnosis Date Noted  . Acute infection of nasal sinus 04/25/2015  . Bilateral leg pain 11/13/2014  . Smoker 05/02/2014  . LGSIL (low grade squamous intraepithelial dysplasia) 05/02/2014  . VIN I (vulvar intraepithelial neoplasia I) 05/02/2014  . Chest pain 11/30/2013  . Abnormal EKG 11/30/2013  .  Palpitations   . MVP (mitral valve prolapse)   . Dysuria 06/09/2013     Prior to Admission medications   Medication Sig Start Date End Date Taking? Authorizing Provider  fluticasone (FLONASE) 50 MCG/ACT nasal spray Place 2 sprays into both nostrils daily. 12/31/14  Yes Wallis Bamberg, PA-C  ibuprofen (ADVIL,MOTRIN) 200 MG tablet Take 600 mg by mouth every 6 (six) hours as needed.    Historical Provider, MD     Allergies  Allergen Reactions  . Imitrex [Sumatriptan] Hives and Anaphylaxis  . Iohexol Hives    Patient must pre medicated if having an exam with contrast per Dr. Maxwell// rls  . Bupropion Hives  . Prozac [Fluoxetine Hcl] Hives  . Penicillins Hives  . Sulfa Antibiotics Hives  . Zoloft [Sertraline Hcl] Hives       Objective:  Physical Exam  Constitutional: She is oriented to person, place, and time. Vital signs are normal. She appears well-developed and well-nourished. No distress.  BP 108/88 mmHg  Pulse 77  Temp(Src) 98.4 F (36.9 C) (Oral)  Resp 16  Ht 5' 7.25" (1.708 m)  Wt 167 lb (75.751 kg)  BMI 25.97 kg/m2  LMP 05/16/2015   HENT:  Head: Normocephalic and atraumatic.  Right Ear: Hearing, tympanic membrane, external ear and ear canal normal.  Left Ear: Hearing, tympanic membrane, external ear and ear canal normal.  Nose: Mucosal edema (RIGHT) and rhinorrhea present.  No foreign bodies. Right sinus exhibits no maxillary sinus tenderness and no frontal  sinus tenderness. Left sinus exhibits no maxillary sinus tenderness and no frontal sinus tenderness.  Mouth/Throat: Uvula is midline, oropharynx is clear and moist and mucous membranes are normal. No uvula swelling. No oropharyngeal exudate.  tonsillith RIGHT  Eyes: Conjunctivae and EOM are normal. Pupils are equal, round, and reactive to light. Right eye exhibits no discharge. Left eye exhibits no discharge. No scleral icterus.  Neck: Trachea normal, normal range of motion and full passive range of motion without  pain. Neck supple. No thyroid mass and no thyromegaly present.  Cardiovascular: Normal rate, regular rhythm and normal heart sounds.   Pulmonary/Chest: Effort normal and breath sounds normal.  Lymphadenopathy:       Head (right side): Tonsillar (tender, fullness, not discrete lymphadenopathy) adenopathy present. No submandibular, no preauricular, no posterior auricular and no occipital adenopathy present.       Head (left side): No submandibular, no tonsillar, no preauricular and no occipital adenopathy present.    She has no cervical adenopathy.       Right: No supraclavicular adenopathy present.       Left: No supraclavicular adenopathy present.  Neurological: She is alert and oriented to person, place, and time. She has normal strength. No cranial nerve deficit or sensory deficit.  Skin: Skin is warm, dry and intact. No rash noted.  Psychiatric: She has a normal mood and affect. Her speech is normal and behavior is normal.       Results for orders placed or performed in visit on 05/23/15  POCT CBC  Result Value Ref Range   WBC 4.8 4.6 - 10.2 K/uL   Lymph, poc 1.7 0.6 - 3.4   POC LYMPH PERCENT 34.6 10 - 50 %L   MID (cbc) 0.2 0 - 0.9   POC MID % 3.6 0 - 12 %M   POC Granulocyte 3.0 2 - 6.9   Granulocyte percent 61.8 37 - 80 %G   RBC 5.00 4.04 - 5.48 M/uL   Hemoglobin 14.2 12.2 - 16.2 g/dL   HCT, POC 16.1 09.6 - 47.9 %   MCV 86.2 80 - 97 fL   MCH, POC 28.5 27 - 31.2 pg   MCHC 33.1 31.8 - 35.4 g/dL   RDW, POC 04.5 %   Platelet Count, POC 295 142 - 424 K/uL   MPV 7.8 0 - 99.8 fL  POCT rapid strep A  Result Value Ref Range   Rapid Strep A Screen Negative Negative       Assessment & Plan:   1. Sore throat I suspect this is due to dranange from the RIGHT side of the sinuses, and suspect the nodes are reactive to that. Reassured by the normal CBC. Given recent exposure to strep throat, will cover with azithromycin (she is PCN allergic) - POCT CBC - POCT rapid strep A -  azithromycin (ZITHROMAX) 250 MG tablet; Take 2 tabs PO x 1 dose, then 1 tab PO QD x 4 days  Dispense: 6 tablet; Refill: 0 - Culture, Group A Strep  2. Other chronic sinusitis Encouraged daily use of steroid nasal spray and the addition of a daily OTC oral antihistamine. Add Singluair and refer to allergy for additional evaluation. - Ambulatory referral to Allergy - montelukast (SINGULAIR) 10 MG tablet; Take 1 tablet (10 mg total) by mouth at bedtime.  Dispense: 90 tablet; Refill: 3 - azithromycin (ZITHROMAX) 250 MG tablet; Take 2 tabs PO x 1 dose, then 1 tab PO QD x 4 days  Dispense: 6 tablet; Refill: 0  3. Vitamin D deficiency Await lab results. Adjust supplement dose if indicated. - Vit D  25 hydroxy (rtn osteoporosis monitoring)  4. Depression Engage a therapist through EAP. Expect sleep to improve with treatment of chronic sinus issues, and in combination with tools to de=escalate her anxiety we hope to see improved mood overall. Can reconsider medication to manage mood if needed, presently she is not interested.   Fernande Bras, PA-C Physician Assistant-Certified [facility]

## 2015-05-24 ENCOUNTER — Encounter: Payer: Self-pay | Admitting: Physician Assistant

## 2015-05-24 LAB — VITAMIN D 25 HYDROXY (VIT D DEFICIENCY, FRACTURES): Vit D, 25-Hydroxy: 25 ng/mL — ABNORMAL LOW (ref 30–100)

## 2015-05-25 LAB — CULTURE, GROUP A STREP: Organism ID, Bacteria: NORMAL

## 2015-05-29 ENCOUNTER — Encounter: Payer: Self-pay | Admitting: Physician Assistant

## 2015-06-01 ENCOUNTER — Emergency Department
Admission: EM | Admit: 2015-06-01 | Discharge: 2015-06-01 | Disposition: A | Discharge status: Home / Self Care | Payer: 59 | Source: Home / Self Care | Attending: Family Medicine | Admitting: Family Medicine

## 2015-06-01 DIAGNOSIS — M940 Chondrocostal junction syndrome [Tietze]: Secondary | ICD-10-CM

## 2015-06-01 DIAGNOSIS — R110 Nausea: Secondary | ICD-10-CM | POA: Diagnosis not present

## 2015-06-01 LAB — POCT CBC W AUTO DIFF (K'VILLE URGENT CARE)

## 2015-06-01 NOTE — ED Notes (Signed)
Patient states that she feels short of breath every time she eats or drinks anything. Patient states this happened again this morning, she decided to come in because this morning she did not have anything to eat only coffee. Symptoms have been going on approx 3 days

## 2015-06-01 NOTE — Discharge Instructions (Signed)
Begin clear liquids (Pedialyte while having diarrhea) until improved, then advance to a [employer] (Bananas, Rice, Applesauce, Toast).  Then gradually resume a regular diet when tolerated.  Avoid milk products until well.  To decrease diarrhea, mix one teaspoon Citrucel (methylcellulose) in 2 oz water and drink one to three times daily.  Do not drink extra fluids with this dose and do not drink fluids for one hour afterwards.  When stools become more formed, may take Imodium (loperamide) once or twice daily to decrease stool frequency. May take Zofran ODT as needed for nausea/vomiting. May take Ibuprofen , 4 tabs every 8 hours as needed for chest discomfort. If symptoms become significantly worse during the night or over the weekend, proceed to the local emergency room.    Costochondritis Costochondritis, sometimes called Tietze syndrome, is a swelling and irritation (inflammation) of the tissue (cartilage) that connects your ribs with your breastbone (sternum). It causes pain in the chest and rib area. Costochondritis usually goes away on its own over time. It can take up to 6 weeks or longer to get better, especially if you are unable to limit your activities. CAUSES  Some cases of costochondritis have no known cause. Possible causes include:  Injury (trauma).  Exercise or activity such as lifting.  Severe coughing. SIGNS AND SYMPTOMS  Pain and tenderness in the chest and rib area.  Pain that gets worse when coughing or taking deep breaths.  Pain that gets worse with specific movements. DIAGNOSIS  Your health care provider will do a physical exam and ask about your symptoms. Chest X-rays or other tests may be done to rule out other problems. TREATMENT  Costochondritis usually goes away on its own over time. Your health care provider may prescribe medicine to help relieve pain. HOME CARE INSTRUCTIONS   Avoid exhausting physical activity. Try not to strain your ribs during normal  activity. This would include any activities using chest, abdominal, and side muscles, especially if heavy weights are used.  Apply ice to the affected area for the first 2 days after the pain begins.  Put ice in a plastic bag.  Place a towel between your skin and the bag.  Leave the ice on for 20 minutes, 2-3 times a day.  Only take over-the-counter or prescription medicines as directed by your health care provider. SEEK MEDICAL CARE IF:  You have redness or swelling at the rib joints. These are signs of infection.  Your pain does not go away despite rest or medicine. SEEK IMMEDIATE MEDICAL CARE IF:   Your pain increases or you are very uncomfortable.  You have shortness of breath or difficulty breathing.  You cough up blood.  You have worse chest pains, sweating, or vomiting.  You have a fever or persistent symptoms for more than 2-3 days.  You have a fever and your symptoms suddenly get worse. MAKE SURE YOU:   Understand these instructions.  Will watch your condition.  Will get help right away if you are not doing well or get worse. Document Released: 06/17/2005 Document Revised: 06/28/2013 Document Reviewed: 04/11/2013 [facility] Patient Information 2015 [place], [place]. This information is not intended to replace advice given to you by your health care provider. Make sure you discuss any questions you have with your health care provider.

## 2015-06-01 NOTE — ED Provider Notes (Signed)
CSN: [id number]     Arrival date & time 06/01/15  1240 History   First MD Initiated Contact with Patient 06/01/15 1403     Chief Complaint  Patient presents with  . Shortness of Breath      HPI Comments: Patient was prescribed Singulair earlier this month.  About five days ago she developed fatigue, nausea/vomiting, and tightness in her anterior chest.  She attributed these symptoms to Singulair and discontinued it.  She has had mild dizziness during the past three days, and experienced loose stools today.  She still has tightness in her anterior chest and consequent difficulty taking a deep breath, but does not have a cough.  No fevers, chills, and sweats     The history is provided by the patient.    Past Medical History  Diagnosis Date  . Palpitations   . MVP (mitral valve prolapse)     echo in 2012 did not show MVP but did have mild elongation of the AMVL  . Allergy   . Anxiety   . Arthritis   . Depression   . Hyperlipidemia   . Tachycardia   . Intestinal malrotation   . Hypertension 05/2013   Past Surgical History  Procedure Laterality Date  . Intestinal malrotation repair  1999  . Tubal ligation    . Appendectomy  1999  . Colposcopy  2006    LGSIL, normal since  . Excision morton's neuroma Bilateral 2002  . Nasal sinus surgery     Family History  Problem Relation Age of Onset  . Heart disease Mother   . Hypertension Mother   . Aneurysm Father 36    brain   . Hypertension Sister   . Heart disease Maternal Grandmother   . Cancer Paternal Grandmother   . Cancer Paternal Grandfather    Social History  Substance Use Topics  . Smoking status: Former Games developer  . Smokeless tobacco: Never Used     Comment: quit 1 year ago, sep 2013  . Alcohol Use: No     Comment: occasionally   OB History    Gravida Para Term Preterm AB TAB SAB Ectopic Multiple Living   1 1 1       1      Review of Systems No sore throat No cough No pleuritic pain + wheezing + nasal  congestion No post-nasal drainage No sinus pain/pressure No itchy/red eyes No earache No hemoptysis + SOB No fever/chills + nausea No vomiting No abdominal pain + diarrhea No urinary symptoms No skin rash + fatigue No myalgias + headache Used OTC meds without relief  Allergies  Imitrex; Iohexol; Bupropion; Prozac; Penicillins; Sulfa antibiotics; and Zoloft  Home Medications   Prior to Admission medications   Medication Sig Start Date End Date Taking? Authorizing Provider  cetirizine (ZYRTEC) 10 MG tablet Take 10 mg by mouth daily.   Yes Historical Provider, MD  fluticasone (FLONASE) 50 MCG/ACT nasal spray Place 2 sprays into both nostrils daily. 12/31/14  Yes Wallis Bamberg, PA-C  ibuprofen (ADVIL,MOTRIN) 200 MG tablet Take 600 mg by mouth every 6 (six) hours as needed.   Yes Historical Provider, MD   Meds Ordered and Administered this Visit  Medications - No data to display  BP 128/80 mmHg  Pulse 89  Temp(Src) 98.5 F (36.9 C)  Ht 5\' 8"  (1.727 m)  Wt 170 lb (77.111 kg)  BMI 25.85 kg/m2  SpO2 98%  LMP 05/16/2015 No data found.   Physical Exam Nursing notes and Vital  Signs reviewed. Appearance:  Patient appears stated age, and in no acute distress Eyes:  Pupils are equal, round, and reactive to light and accomodation.  Extraocular movement is intact.  Conjunctivae are not inflamed  Ears:  Canals normal.  Tympanic membranes normal.  Nose:  Mildly congested turbinates.  No sinus tenderness.    Pharynx:  Normal Neck:  Supple.  No adenopathy Lungs:  Clear to auscultation.  Breath sounds are equal.  Moving air well.  Chest:  There is tenderness to palpation over the mid-sternum extending to the left chest.  Heart:  Regular rate and rhythm without murmurs, rubs, or gallops.  Abdomen:  Nontender without masses or hepatosplenomegaly.  Bowel sounds are present.  No CVA or flank tenderness.  Extremities:  No edema.  No calf tenderness Skin:  No rash present.   ED Course   Procedures      Labs Reviewed  POCT CBC W AUTO DIFF ([facility]):  WBC 8.1; LY 19.0; MO 3.3; GR 77.7; Hgb 13.1;  Platelets 232       MDM   1. Costochondritis   2. Nausea without vomiting; suspect a resolving viral gastroenteritis with costochondritis    Reassurance. Begin clear liquids (Pedialyte while having diarrhea) until improved, then advance to a [employer] (Bananas, Rice, Applesauce, Toast).  Then gradually resume a regular diet when tolerated.  Avoid milk products until well.  To decrease diarrhea, mix one teaspoon Citrucel (methylcellulose) in 2 oz water and drink one to three times daily.  Do not drink extra fluids with this dose and do not drink fluids for one hour afterwards.  When stools become more formed, may take Imodium (loperamide) once or twice daily to decrease stool frequency. May take Zofran ODT as needed for nausea/vomiting. May take Ibuprofen , 4 tabs every 8 hours as needed for chest discomfort. If symptoms become significantly worse during the night or over the weekend, proceed to the local emergency room.  Followup with Family Doctor if not improved in four days.    Lattie Haw, MD 06/04/15 [phone]

## 2015-06-11 ENCOUNTER — Other Ambulatory Visit: Payer: Self-pay

## 2015-06-11 DIAGNOSIS — Z1231 Encounter for screening mammogram for malignant neoplasm of breast: Secondary | ICD-10-CM

## 2015-06-25 ENCOUNTER — Encounter: Payer: Self-pay | Admitting: Nurse Practitioner

## 2015-06-25 ENCOUNTER — Ambulatory Visit (INDEPENDENT_AMBULATORY_CARE_PROVIDER_SITE_OTHER): Payer: 59 | Admitting: Nurse Practitioner

## 2015-06-25 VITALS — BP 120/84 | HR 56 | Ht 68.75 in | Wt 169.0 lb

## 2015-06-25 DIAGNOSIS — Z0000 Encounter for general adult medical examination without abnormal findings: Secondary | ICD-10-CM

## 2015-06-25 DIAGNOSIS — Z01419 Encounter for gynecological examination (general) (routine) without abnormal findings: Secondary | ICD-10-CM

## 2015-06-25 DIAGNOSIS — R896 Abnormal cytological findings in specimens from other organs, systems and tissues: Secondary | ICD-10-CM

## 2015-06-25 DIAGNOSIS — IMO0002 Reserved for concepts with insufficient information to code with codable children: Secondary | ICD-10-CM

## 2015-06-25 LAB — POCT URINALYSIS DIPSTICK
Bilirubin, UA: NEGATIVE
Blood, UA: NEGATIVE
Glucose, UA: NEGATIVE
Ketones, UA: NEGATIVE
Leukocytes, UA: NEGATIVE
Nitrite, UA: NEGATIVE
Protein, UA: NEGATIVE
Urobilinogen, UA: NEGATIVE
pH, UA: 5.5
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
## 2015-06-25 NOTE — Progress Notes (Signed)
Encounter reviewed Hao Dion, MD   

## 2015-06-25 NOTE — Patient Instructions (Signed)

## 2015-06-27 LAB — IPS PAP TEST WITH HPV

## 2015-07-16 ENCOUNTER — Ambulatory Visit
Admission: RE | Admit: 2015-07-16 | Discharge: 2015-07-16 | Disposition: A | Discharge status: Home / Self Care | Payer: 59 | Source: Ambulatory Visit

## 2015-07-16 DIAGNOSIS — Z1231 Encounter for screening mammogram for malignant neoplasm of breast: Secondary | ICD-10-CM

## 2015-07-18 ENCOUNTER — Encounter (HOSPITAL_COMMUNITY): Payer: Self-pay | Admitting: Emergency Medicine

## 2015-07-18 ENCOUNTER — Emergency Department (HOSPITAL_COMMUNITY): Payer: 59

## 2015-07-18 ENCOUNTER — Emergency Department (HOSPITAL_COMMUNITY)
Admission: EM | Admit: 2015-07-18 | Discharge: 2015-07-18 | Disposition: A | Discharge status: Home / Self Care | Payer: 59 | Attending: Emergency Medicine | Admitting: Emergency Medicine

## 2015-07-18 DIAGNOSIS — R61 Generalized hyperhidrosis: Secondary | ICD-10-CM | POA: Diagnosis not present

## 2015-07-18 DIAGNOSIS — K5900 Constipation, unspecified: Secondary | ICD-10-CM | POA: Diagnosis not present

## 2015-07-18 DIAGNOSIS — I10 Essential (primary) hypertension: Secondary | ICD-10-CM | POA: Insufficient documentation

## 2015-07-18 DIAGNOSIS — R300 Dysuria: Secondary | ICD-10-CM | POA: Diagnosis not present

## 2015-07-18 DIAGNOSIS — R110 Nausea: Secondary | ICD-10-CM | POA: Insufficient documentation

## 2015-07-18 DIAGNOSIS — Z9851 Tubal ligation status: Secondary | ICD-10-CM | POA: Diagnosis not present

## 2015-07-18 DIAGNOSIS — Z3202 Encounter for pregnancy test, result negative: Secondary | ICD-10-CM | POA: Diagnosis not present

## 2015-07-18 DIAGNOSIS — Z880 Allergy status to penicillin: Secondary | ICD-10-CM | POA: Insufficient documentation

## 2015-07-18 DIAGNOSIS — Z87891 Personal history of nicotine dependence: Secondary | ICD-10-CM | POA: Diagnosis not present

## 2015-07-18 DIAGNOSIS — R6883 Chills (without fever): Secondary | ICD-10-CM | POA: Insufficient documentation

## 2015-07-18 DIAGNOSIS — H938X2 Other specified disorders of left ear: Secondary | ICD-10-CM | POA: Diagnosis not present

## 2015-07-18 DIAGNOSIS — Z87738 Personal history of other specified (corrected) congenital malformations of digestive system: Secondary | ICD-10-CM | POA: Diagnosis not present

## 2015-07-18 DIAGNOSIS — Z8639 Personal history of other endocrine, nutritional and metabolic disease: Secondary | ICD-10-CM | POA: Diagnosis not present

## 2015-07-18 DIAGNOSIS — Z8659 Personal history of other mental and behavioral disorders: Secondary | ICD-10-CM | POA: Insufficient documentation

## 2015-07-18 DIAGNOSIS — Z8739 Personal history of other diseases of the musculoskeletal system and connective tissue: Secondary | ICD-10-CM | POA: Insufficient documentation

## 2015-07-18 DIAGNOSIS — R42 Dizziness and giddiness: Secondary | ICD-10-CM | POA: Diagnosis not present

## 2015-07-18 DIAGNOSIS — R1084 Generalized abdominal pain: Secondary | ICD-10-CM

## 2015-07-18 LAB — WET PREP, GENITAL
Trich, Wet Prep: NONE SEEN
Yeast Wet Prep HPF POC: NONE SEEN

## 2015-07-18 LAB — CBC WITH DIFFERENTIAL/PLATELET
Basophils Absolute: 0 10*3/uL (ref 0.0–0.1)
Basophils Relative: 1 %
Eosinophils Absolute: 0.1 10*3/uL (ref 0.0–0.7)
Eosinophils Relative: 2 %
HCT: 42.4 % (ref 36.0–46.0)
Hemoglobin: 14.2 g/dL (ref 12.0–15.0)
Lymphocytes Relative: 25 %
Lymphs Abs: 1.5 10*3/uL (ref 0.7–4.0)
MCH: 29.7 pg (ref 26.0–34.0)
MCHC: 33.5 g/dL (ref 30.0–36.0)
MCV: 88.7 fL (ref 78.0–100.0)
Monocytes Absolute: 0.3 10*3/uL (ref 0.1–1.0)
Monocytes Relative: 6 %
Neutro Abs: 3.8 10*3/uL (ref 1.7–7.7)
Neutrophils Relative %: 66 %
Platelets: 276 10*3/uL (ref 150–400)
RBC: 4.78 MIL/uL (ref 3.87–5.11)
RDW: 12.8 % (ref 11.5–15.5)
WBC: 5.8 10*3/uL (ref 4.0–10.5)

## 2015-07-18 LAB — URINALYSIS, ROUTINE W REFLEX MICROSCOPIC
Bilirubin Urine: NEGATIVE
Glucose, UA: NEGATIVE mg/dL
Ketones, ur: NEGATIVE mg/dL
Leukocytes, UA: NEGATIVE
Nitrite: NEGATIVE
Protein, ur: NEGATIVE mg/dL
Specific Gravity, Urine: 1.009 (ref 1.005–1.030)
Urobilinogen, UA: 0.2 mg/dL (ref 0.0–1.0)
pH: 5.5 (ref 5.0–8.0)

## 2015-07-18 LAB — COMPREHENSIVE METABOLIC PANEL
ALT: 11 U/L — ABNORMAL LOW (ref 14–54)
AST: 17 U/L (ref 15–41)
Albumin: 4.3 g/dL (ref 3.5–5.0)
Alkaline Phosphatase: 99 U/L (ref 38–126)
Anion gap: 7 (ref 5–15)
BUN: 17 mg/dL (ref 6–20)
CO2: 27 mmol/L (ref 22–32)
Calcium: 9.5 mg/dL (ref 8.9–10.3)
Chloride: 104 mmol/L (ref 101–111)
Creatinine, Ser: 0.85 mg/dL (ref 0.44–1.00)
GFR calc Af Amer: >60 mL/min (ref >60)
GFR calc non Af Amer: >60 mL/min (ref >60)
Glucose, Bld: 102 mg/dL — ABNORMAL HIGH (ref 65–99)
Potassium: 4.1 mmol/L (ref 3.5–5.1)
Sodium: 138 mmol/L (ref 135–145)
Total Bilirubin: 0.5 mg/dL (ref 0.3–1.2)
Total Protein: 7.8 g/dL (ref 6.5–8.1)

## 2015-07-18 LAB — URINE MICROSCOPIC-ADD ON

## 2015-07-18 LAB — I-STAT TROPONIN, ED: Troponin i, poc: 0 ng/mL (ref 0.00–0.08)

## 2015-07-18 LAB — LIPASE, BLOOD: Lipase: 43 U/L (ref 11–51)

## 2015-07-18 LAB — I-STAT CG4 LACTIC ACID, ED: Lactic Acid, Venous: 0.76 mmol/L (ref 0.5–2.0)

## 2015-07-18 LAB — CBG MONITORING, ED: Glucose-Capillary: 88 mg/dL (ref 65–99)

## 2015-07-18 LAB — POC URINE PREG, ED: Preg Test, Ur: NEGATIVE

## 2015-07-18 MED ORDER — KETOROLAC TROMETHAMINE 30 MG/ML IJ SOLN
30.0000 mg | Freq: Once | INTRAMUSCULAR | Status: AC
  Administered 2015-07-18: 30 mg via INTRAVENOUS
  Filled 2015-07-18: qty 1

## 2015-07-18 MED ORDER — MECLIZINE HCL 25 MG PO TABS
25.0000 mg | ORAL_TABLET | Freq: Once | ORAL | Status: AC
Start: 2015-07-18 — End: 2015-07-18
  Administered 2015-07-18: 25 mg via ORAL
  Filled 2015-07-18: qty 1

## 2015-07-18 MED ORDER — MECLIZINE HCL 25 MG PO TABS: 25.0000 mg | ORAL_TABLET | Freq: Three times a day (TID) | ORAL | Status: DC | PRN

## 2015-07-18 MED ORDER — MORPHINE SULFATE (PF) 4 MG/ML IV SOLN: 4.0000 mg | Freq: Once | INTRAVENOUS | Status: DC

## 2015-07-18 MED ORDER — BARIUM SULFATE 2.1 % PO SUSP
450.0000 mL | Freq: Two times a day (BID) | ORAL | Status: AC
  Administered 2015-07-18: 450 mL via ORAL

## 2015-07-18 MED ORDER — SODIUM CHLORIDE 0.9 % IV SOLN
1000.0000 mL | Freq: Once | INTRAVENOUS | Status: AC
  Administered 2015-07-18: 1000 mL via INTRAVENOUS

## 2015-07-18 MED ORDER — SODIUM CHLORIDE 0.9 % IV SOLN
1000.0000 mL | INTRAVENOUS | Status: DC
  Administered 2015-07-18: 1000 mL via INTRAVENOUS

## 2015-07-18 MED ORDER — ONDANSETRON HCL 4 MG/2ML IJ SOLN
4.0000 mg | Freq: Once | INTRAMUSCULAR | Status: AC
  Administered 2015-07-18: 4 mg via INTRAVENOUS
  Filled 2015-07-18: qty 2

## 2015-07-18 NOTE — ED Provider Notes (Signed)
Physical Exam  BP 131/63 mmHg  Pulse 76  Temp(Src) 97.8 F (36.6 C) (Oral)  Resp 15  SpO2 100%  LMP 07/18/2015 (Exact Date)  Physical Exam  Constitutional: She appears well-developed and well-nourished. No distress.  HENT:  Head: Atraumatic.  Eyes: Conjunctivae are normal.  Neck: Neck supple.  No nuchal rigidity  Cardiovascular: Normal rate and regular rhythm.   Pulmonary/Chest: Effort normal and breath sounds normal.  Abdominal: Soft. She exhibits no distension. There is tenderness (mild tenderness across lower abdomen, no guarding or rebound tenderness. ).  Neurological: She is alert.  Skin: No rash noted.  Psychiatric: She has a normal mood and affect.  Nursing note and vitals reviewed.   ED Course  Procedures  MDM Received sign out at beginning of shift.  Pt here with abd pain, dizziness, and nausea.  She is currently awaits abd/pelvis CT scan.  If negative, pt can f/u with PCP for further evaluation.    4:29 PM Abd/pelvis CT without acute finding.  Pt made aware of result.  Her primary complaint is headache and dizziness.  Dizziness worsening with positional changes, and headache is frontal and likely sinus.  She has no fever or nuchal rigidity to suggest meningitis, no focal neuro deficit to suggest stroke, and no thunderclap headache to suggest SAH.  Pt able to ambulate with steady gait.  Will give meclizine and pt can f/u with PCP for further care.  All questions answered to pt's satisfaction.  Suspect BPPV.    BP 131/63 mmHg  Pulse 76  Temp(Src) 97.8 F (36.6 C) (Oral)  Resp 15  SpO2 100%  LMP 07/14/2015  I have reviewed nursing notes and vital signs. I personally viewed the imaging tests through PACS system and agrees with radiologist's intepretation I reviewed available ER/hospitalization records through the EMR  Results for orders placed or performed during the hospital encounter of 07/18/15  Wet prep, genital  Result Value Ref Range   Yeast Wet Prep HPF  POC NONE SEEN NONE SEEN   Trich, Wet Prep NONE SEEN NONE SEEN   Clue Cells Wet Prep HPF POC FEW (A) NONE SEEN   WBC, Wet Prep HPF POC FEW (A) NONE SEEN  CBC WITH DIFFERENTIAL  Result Value Ref Range   WBC 5.8 4.0 - 10.5 K/uL   RBC 4.78 3.87 - 5.11 MIL/uL   Hemoglobin 14.2 12.0 - 15.0 g/dL   HCT 16.142.4 09.636.0 - 04.546.0 %   MCV 88.7 78.0 - 100.0 fL   MCH 29.7 26.0 - 34.0 pg   MCHC 33.5 30.0 - 36.0 g/dL   RDW 40.912.8 81.111.5 - 91.415.5 %   Platelets 276 150 - 400 K/uL   Neutrophils Relative % 66 %   Neutro Abs 3.8 1.7 - 7.7 K/uL   Lymphocytes Relative 25 %   Lymphs Abs 1.5 0.7 - 4.0 K/uL   Monocytes Relative 6 %   Monocytes Absolute 0.3 0.1 - 1.0 K/uL   Eosinophils Relative 2 %   Eosinophils Absolute 0.1 0.0 - 0.7 K/uL   Basophils Relative 1 %   Basophils Absolute 0.0 0.0 - 0.1 K/uL  Comprehensive metabolic panel  Result Value Ref Range   Sodium 138 135 - 145 mmol/L   Potassium 4.1 3.5 - 5.1 mmol/L   Chloride 104 101 - 111 mmol/L   CO2 27 22 - 32 mmol/L   Glucose, Bld 102 (H) 65 - 99 mg/dL   BUN 17 6 - 20 mg/dL   Creatinine, Ser 7.820.85 0.44 -  1.00 mg/dL   Calcium 9.5 8.9 - 16.1 mg/dL   Total Protein 7.8 6.5 - 8.1 g/dL   Albumin 4.3 3.5 - 5.0 g/dL   AST 17 15 - 41 U/L   ALT 11 (L) 14 - 54 U/L   Alkaline Phosphatase 99 38 - 126 U/L   Total Bilirubin 0.5 0.3 - 1.2 mg/dL   GFR calc non Af Amer >60 >60 mL/min   GFR calc Af Amer >60 >60 mL/min   Anion gap 7 5 - 15  Urinalysis, Routine w reflex microscopic (not at [facility])  Result Value Ref Range   Color, Urine YELLOW YELLOW   APPearance CLOUDY (A) CLEAR   Specific Gravity, Urine 1.009 1.005 - 1.030   pH 5.5 5.0 - 8.0   Glucose, UA NEGATIVE NEGATIVE mg/dL   Hgb urine dipstick MODERATE (A) NEGATIVE   Bilirubin Urine NEGATIVE NEGATIVE   Ketones, ur NEGATIVE NEGATIVE mg/dL   Protein, ur NEGATIVE NEGATIVE mg/dL   Urobilinogen, UA 0.2 0.0 - 1.0 mg/dL   Nitrite NEGATIVE NEGATIVE   Leukocytes, UA NEGATIVE NEGATIVE  Lipase, blood  Result Value Ref  Range   Lipase 43 11 - 51 U/L  Urine microscopic-add on  Result Value Ref Range   Squamous Epithelial / LPF FEW (A) RARE   WBC, UA 0-2 <3 WBC/hpf   RBC / HPF 0-2 <3 RBC/hpf   Bacteria, UA FEW (A) RARE  I-stat troponin, ED  (not at [facility])  Result Value Ref Range   Troponin i, poc 0.00 0.00 - 0.08 ng/mL   Comment 3          POC urine preg, ED (not at [facility])  Result Value Ref Range   Preg Test, Ur NEGATIVE NEGATIVE  I-Stat CG4 Lactic Acid, ED  Result Value Ref Range   Lactic Acid, Venous 0.76 0.5 - 2.0 mmol/L  CBG monitoring, ED  Result Value Ref Range   Glucose-Capillary 88 65 - 99 mg/dL   Ct Abdomen Pelvis Wo Contrast  07/18/2015  CLINICAL DATA:  Abdominal pain, evaluate for obstruction EXAM: CT ABDOMEN AND PELVIS WITHOUT CONTRAST TECHNIQUE: Multidetector CT imaging of the abdomen and pelvis was performed following the standard protocol without IV contrast. COMPARISON:  Abdominal x-ray same day, CT scan of the abdomen 04/05/2015 FINDINGS: Lung bases are unremarkable. Sagittal images of the spine are unremarkable. Unenhanced liver, pancreas, spleen and adrenal glands are unremarkable. No aortic aneurysm. Oral contrast material was given to the patient. There is no small bowel obstruction. No thickened or dilated small bowel loops. There is no suggestion of enteritis. No transition point in caliber of small bowel. Mild atherosclerotic calcifications of distal abdominal aorta and proximal common iliac arteries. No aortic aneurysm. Unenhanced kidneys are symmetrical in size. There is cortical scarring midpole of the right kidney. No nephrolithiasis. No hydronephrosis or hydroureter. There is at least partial mal rotation again noted with cecum in midline anterior pelvis just above the urinary bladder. Majority of small bowel is located in right abdomen. No pericecal inflammation. The patient is status post appendectomy. No adnexal mass. Mild retroflexed uterus. There is a redundant sigmoid  colon. Moderate gas noted in mid sigmoid colon in right lower quadrant. Some colonic stool noted in distal sigmoid colon and rectum. No colonic obstruction. No inguinal adenopathy. No destructive bony lesions are noted within pelvis. IMPRESSION: 1. No small bowel obstruction.  No suggestion of enteritis. 2. Again noted at least partial malrotation of the colon with cecum in midline lower anterior  pelvis. No pericecal inflammation. 3. Mild redundant sigmoid colon. Moderate gas noted in mid sigmoid colon in right lower quadrant. 4. Mild retroflexed uterus.  No adnexal mass. Electronically Signed   By: Natasha Mead M.D.   On: 07/18/2015 15:58   Dg Abd Acute W/chest  07/18/2015  CLINICAL DATA:  Generalized abdominal pain, nausea EXAM: DG ABDOMEN ACUTE W/ 1V CHEST COMPARISON:  07/20/ 16 FINDINGS: Mild gaseous distended small bowel loops right abdomen suspicious for ileus or enteritis. Moderate stool noted within distal sigmoid colon and rectum. Cardiomediastinal silhouette is stable. No acute infiltrate or pulmonary edema. IMPRESSION: Mild gaseous distended small bowel loops in left abdomen suspicious for ileus or enteritis. Moderate stool noted distal sigmoid colon and rectum. No acute disease within chest. Electronically Signed   By: Natasha Mead M.D.   On: 07/18/2015 13:45   Mm Screening Breast Tomo Bilateral  07/17/2015  CLINICAL DATA:  Screening. EXAM: DIGITAL SCREENING BILATERAL MAMMOGRAM WITH 3D TOMO WITH CAD COMPARISON:  Previous exam(s). ACR Breast Density Category b: There are scattered areas of fibroglandular density. FINDINGS: There are no findings suspicious for malignancy. Images were processed with CAD. IMPRESSION: No mammographic evidence of malignancy. A result letter of this screening mammogram will be mailed directly to the patient. RECOMMENDATION: Screening mammogram in one year. (Code:SM-B-01Y) BI-RADS CATEGORY  1: Negative. Electronically Signed   By: Hulan Saas M.D.   On: 07/17/2015 12:49        Fayrene Helper, PA-C 07/18/15 1647  Marily Memos, MD 07/19/15 1049

## 2015-07-18 NOTE — Discharge Instructions (Signed)
Please take meclizine as needed for your dizziness.  Follow up with your doctor for further evaluation of your symptoms.  Take tylenol or ibuprofen as needed for your headache.  Abdominal Pain, Adult Many things can cause belly (abdominal) pain. Most times, the belly pain is not dangerous. Many cases of belly pain can be watched and treated at home. HOME CARE   Do not take medicines that help you go poop (laxatives) unless told to by your doctor.  Only take medicine as told by your doctor.  Eat or drink as told by your doctor. Your doctor will tell you if you should be on a special diet. GET HELP IF:  You do not know what is causing your belly pain.  You have belly pain while you are sick to your stomach (nauseous) or have runny poop (diarrhea).  You have pain while you pee or poop.  Your belly pain wakes you up at night.  You have belly pain that gets worse or better when you eat.  You have belly pain that gets worse when you eat fatty foods.  You have a fever. GET HELP RIGHT AWAY IF:   The pain does not go away within 2 hours.  You keep throwing up (vomiting).  The pain changes and is only in the right or left part of the belly.  You have bloody or tarry looking poop. MAKE SURE YOU:   Understand these instructions.  Will watch your condition.  Will get help right away if you are not doing well or get worse.   This information is not intended to replace advice given to you by your health care provider. Make sure you discuss any questions you have with your health care provider.   Document Released: 02/24/2008 Document Revised: 09/28/2014 Document Reviewed: 05/17/2013 Elsevier Interactive Patient Education 2016 [employer]  Dizziness Dizziness is a common problem. It makes you feel unsteady or lightheaded. You may feel like you are about to pass out (faint). Dizziness can lead to injury if you stumble or fall. Anyone can get dizzy, but dizziness is more common in  older adults. This condition can be caused by a number of things, including:  Medicines.  Dehydration.  Illness. HOME CARE Following these instructions may help with your condition: Eating and Drinking  Drink enough fluid to keep your pee (urine) clear or pale yellow. This helps to keep you from getting dehydrated. Try to drink more clear fluids, such as water.  Do not drink alcohol.  Limit how much caffeine you drink or eat if told by your doctor.  Limit how much salt you drink or eat if told by your doctor. Activity  Avoid making quick movements.  When you stand up from sitting in a chair, steady yourself until you feel okay.  In the morning, first sit up on the side of the bed. When you feel okay, stand slowly while you hold onto something. Do this until you know that your balance is fine.  Move your legs often if you need to stand in one place for a long time. Tighten and relax your muscles in your legs while you are standing.  Do not drive or use heavy machinery if you feel dizzy.  Avoid bending down if you feel dizzy. Place items in your home so that they are easy for you to reach without leaning over. Lifestyle  Do not use any tobacco products, including cigarettes, chewing tobacco, or electronic cigarettes. If you need help quitting, ask  your doctor.  Try to lower your stress level, such as with yoga or meditation. Talk with your doctor if you need help. General Instructions  Watch your dizziness for any changes.  Take medicines only as told by your doctor. Talk with your doctor if you think that your dizziness is caused by a medicine that you are taking.  Tell a friend or a family member that you are feeling dizzy. If he or she notices any changes in your behavior, have this person call your doctor.  Keep all follow-up visits as told by your doctor. This is important. GET HELP IF:  Your dizziness does not go away.  Your dizziness or light-headedness gets  worse.  You feel sick to your stomach (nauseous).  You have trouble hearing.  You have new symptoms.  You are unsteady on your feet or you feel like the room is spinning. GET HELP RIGHT AWAY IF:  You throw up (vomit) or have diarrhea and are unable to eat or drink anything.  You have trouble:  Talking.  Walking.  Swallowing.  Using your arms, hands, or legs.  You feel generally weak.  You are not thinking clearly or you have trouble forming sentences. It may take a friend or family member to notice this.  You have:  Chest pain.  Pain in your belly (abdomen).  Shortness of breath.  Sweating.  Your vision changes.  You are bleeding.  You have a headache.  You have neck pain or a stiff neck.  You have a fever.   This information is not intended to replace advice given to you by your health care provider. Make sure you discuss any questions you have with your health care provider.   Document Released: 08/27/2011 Document Revised: 01/22/2015 Document Reviewed: 09/03/2014 Elsevier Interactive Patient Education [employer]

## 2015-07-18 NOTE — ED Notes (Signed)
Pt walked to and from restroom.

## 2015-07-18 NOTE — ED Provider Notes (Signed)
CSN: [id number]     Arrival date & time 07/18/15  1151 History   First MD Initiated Contact with Patient 07/18/15 1203     Chief Complaint  Patient presents with  . Nausea  . Dizziness  . Abdominal Pain     (Consider location/radiation/quality/duration/timing/severity/associated sxs/prior Treatment) HPI Comments: Victoria Barr is a 44 y.o. female with a PMHx of mitral valve prolapse (last echo normal), palpitations, anxiety, depression, HLD, HTN, and intestinal malrotation with a PSHx of malrotation repair, tubal ligation, and appendectomy, who presents to the ED with complaints of lightheadedness with standing, diaphoresis, and abdominal pain that began yesterday. She has been feeling intermittently lightheaded when she stands since Sunday, but this morning she felt well until just prior to arrival when she had another episode of lightheadedness when she stood up and felt "flushed", stating that she felt like she was going to have diarrhea but this did not occur. Associated symptoms include 8/10 suprapubic/mid abdominal pain which she describes as dull and achy, nonradiating, worse with urination, with a treatments tried prior to arrival. Additional symptoms include dysuria, chills, constipation which is a chronic issue and her last bowel movement was yesterday, malodorous urine, nausea, and left ear pain/pressure 3 weeks.  She is currently on her menstrual cycle, started 4 days ago, and states it is no heavier than normal. She denies any fevers, chest pain, shortness breath, vomiting, diarrhea, melena, hematochezia, hematuria, urinary frequency or urgency, vaginal discharge, numbness, tingling, weakness, vision changes, or hearing loss. She reports that occasionally she has had scant rectal bleeding when she has had hemorrhoids, but this is not ongoing today. She reports that this feels similar to symptoms she had in July when she had pyelonephritis.   At this time she feels somewhat better as  far as the lightheadedness is concerned.  Patient is a 44 y.o. female presenting with dizziness and abdominal pain. The history is provided by the patient. No language interpreter was used.  Dizziness Quality:  Lightheadedness Severity:  Moderate Onset quality:  Gradual Timing:  Intermittent Progression:  Improving Chronicity:  Recurrent Context: standing up   Relieved by:  Lying down Worsened by:  Standing up Ineffective treatments:  None tried Associated symptoms: nausea   Associated symptoms: no blood in stool, no chest pain, no diarrhea, no shortness of breath, no syncope, no vision changes, no vomiting and no weakness   Abdominal Pain Associated symptoms: chills, constipation (chronic issue, unchanged, last BM yesterday), dysuria, nausea and vaginal bleeding (on menses)   Associated symptoms: no chest pain, no diarrhea, no fever, no hematuria, no shortness of breath, no vaginal discharge and no vomiting     Past Medical History  Diagnosis Date  . Palpitations   . MVP (mitral valve prolapse)     echo in 2012 did not show MVP but did have mild elongation of the AMVL  . Allergy   . Anxiety   . Arthritis   . Depression   . Hyperlipidemia   . Tachycardia   . Intestinal malrotation   . Hypertension 05/2013  . History of abnormal cervical Pap smear 2006, 2015    2015 Neg with + HR HPV, 09/2014 ASCUS with + HR HPV, colpo with LEEP 11/2014-[facility] ([facility])   Past Surgical History  Procedure Laterality Date  . Intestinal malrotation repair  1999  . Tubal ligation    . Appendectomy  1999  . Colposcopy  2006, 2015    2006 [facility], normal since; 2015 [facility] w/LEEP  .  Excision morton's neuroma Bilateral 2002  . Nasal sinus surgery    . Cervical biopsy  w/ loop electrode excision  11/28/14    LGSIL with negative margins   Family History  Problem Relation Age of Onset  . Heart disease Mother   . Hypertension Mother   . Aneurysm Father 36    brain   . Hypertension Sister   . Heart  disease Maternal Grandmother   . Cancer Paternal Grandmother   . Cancer Paternal Grandfather    Social History  Substance Use Topics  . Smoking status: Former Games developer  . Smokeless tobacco: Never Used     Comment: quit 1 year ago, sep 2013  . Alcohol Use: No     Comment: occasionally   OB History    Gravida Para Term Preterm AB TAB SAB Ectopic Multiple Living   1 1 1  0 0 0 0 0 0 1     Review of Systems  Constitutional: Positive for chills and diaphoresis. Negative for fever.  HENT: Positive for ear pain (L ear, x3 wks). Negative for ear discharge.   Eyes: Negative for visual disturbance.  Respiratory: Negative for shortness of breath.   Cardiovascular: Negative for chest pain and syncope.  Gastrointestinal: Positive for nausea, abdominal pain and constipation (chronic issue, unchanged, last BM yesterday). Negative for vomiting, diarrhea, blood in stool and anal bleeding (none ongoing).  Genitourinary: Positive for dysuria and vaginal bleeding (on menses). Negative for urgency, hematuria, flank pain and vaginal discharge.  Musculoskeletal: Negative for myalgias and arthralgias.  Skin: Negative for color change.  Allergic/Immunologic: Negative for immunocompromised state.  Neurological: Positive for dizziness and light-headedness (with standing). Negative for weakness and numbness.  Psychiatric/Behavioral: Negative for confusion.   10 Systems reviewed and are negative for acute change except as noted in the HPI.    Allergies  Imitrex; Iohexol; Bupropion; Prozac; Penicillins; Sulfa antibiotics; and Zoloft  Home Medications   Prior to Admission medications   Not on File   BP 143/106 mmHg  Pulse 74  Temp(Src) 97.8 F (36.6 C) (Oral)  Resp 18  SpO2 100%  LMP 06/13/2015 (Exact Date) Physical Exam  Constitutional: She is oriented to person, place, and time. Vital signs are normal. She appears well-developed and well-nourished.  Non-toxic appearance. No distress.  Afebrile,  nontoxic, NAD  HENT:  Head: Normocephalic and atraumatic.  Right Ear: Hearing, tympanic membrane, external ear and ear canal normal.  Left Ear: Hearing, tympanic membrane, external ear and ear canal normal.  Nose: Nose normal.  Mouth/Throat: Oropharynx is clear and moist and mucous membranes are normal.  Ears are clear bilaterally. Nose clear. Oropharynx clear and moist, without uvular swelling or deviation, no trismus or drooling, no tonsillar swelling or erythema, no exudates.    Eyes: Conjunctivae and EOM are normal. Pupils are equal, round, and reactive to light. Right eye exhibits no discharge. Left eye exhibits no discharge.  PERRL, EOMI, no nystagmus, no visual field deficits   Neck: Normal range of motion. Neck supple.  Cardiovascular: Normal rate, regular rhythm, normal heart sounds and intact distal pulses.  Exam reveals no gallop and no friction rub.   No murmur heard. RRR, nl s1/s2, no m/r/g, distal pulses intact, no pedal edema   Pulmonary/Chest: Effort normal and breath sounds normal. No respiratory distress. She has no decreased breath sounds. She has no wheezes. She has no rhonchi. She has no rales.  Abdominal: Soft. Normal appearance and bowel sounds are normal. She exhibits no distension. There is  tenderness in the periumbilical area and suprapubic area. There is no rigidity, no rebound, no guarding, no CVA tenderness, no tenderness at McBurney's point and negative Murphy's sign.    Soft, nondistended, +BS throughout, with mild TTP diffusely but mostly in the suprapubic/lower abdomen and periumbilical area, no r/g/r, neg murphy's, neg mcburney's, no CVA TTP   Genitourinary: Uterus normal. Pelvic exam was performed with patient supine. There is no rash, tenderness or lesion on the right labia. There is no rash, tenderness or lesion on the left labia. Cervix exhibits no motion tenderness, no discharge and no friability. Right adnexum displays no mass, no tenderness and no fullness.  Left adnexum displays no mass, no tenderness and no fullness. There is bleeding (old blood) in the vagina. No tenderness in the vagina. No vaginal discharge found.  Chaperone present for exam. No rashes, lesions, or tenderness to external genitalia. No erythema, injury, or tenderness to vaginal mucosa. No vaginal discharge but with scant old blood within vaginal vault. No adnexal masses, tenderness, or fullness. No CMT, cervical friability, or discharge from cervical os. Uterus non-deviated, mobile, nonTTP, and without enlargement.    Musculoskeletal: Normal range of motion.  MAE x4 Strength and sensation grossly intact Distal pulses intact No pedal edema Gait steady and nonataxic  Neurological: She is alert and oriented to person, place, and time. She has normal strength. No cranial nerve deficit or sensory deficit. Coordination and gait normal. GCS eye subscore is 4. GCS verbal subscore is 5. GCS motor subscore is 6.  CN 2-12 grossly intact A&O x4 GCS 15 Sensation and strength intact Gait nonataxic Coordination with finger-to-nose WNL Neg pronator drift   Skin: Skin is warm, dry and intact. No rash noted.  Psychiatric: She has a normal mood and affect.  Nursing note and vitals reviewed.   ED Course  Procedures (including critical care time)  12:35 Orthostatic Vital Signs DS  Orthostatic Lying  - BP- Lying: 123/78 mmHg ; Pulse- Lying: 69  Orthostatic Sitting - BP- Sitting: 131/79 mmHg ; Pulse- Sitting: 66  Orthostatic Standing at 0 minutes - BP- Standing at 0 minutes: 136/81 mmHg ; Pulse- Standing at 0 minutes: 70      Labs Review Labs Reviewed  WET PREP, GENITAL - Abnormal; Notable for the following:    Clue Cells Wet Prep HPF POC FEW (*)    WBC, Wet Prep HPF POC FEW (*)    All other components within normal limits  COMPREHENSIVE METABOLIC PANEL - Abnormal; Notable for the following:    Glucose, Bld 102 (*)    ALT 11 (*)    All other components within normal limits    URINALYSIS, ROUTINE W REFLEX MICROSCOPIC (NOT AT [facility]) - Abnormal; Notable for the following:    APPearance CLOUDY (*)    Hgb urine dipstick MODERATE (*)    All other components within normal limits  URINE MICROSCOPIC-ADD ON - Abnormal; Notable for the following:    Squamous Epithelial / LPF FEW (*)    Bacteria, UA FEW (*)    All other components within normal limits  CBC WITH DIFFERENTIAL/PLATELET  LIPASE, BLOOD  RPR  HIV ANTIBODY (ROUTINE TESTING)  POCT CBG (FASTING - GLUCOSE)-MANUAL ENTRY  I-STAT TROPOININ, ED  POC URINE PREG, ED  I-STAT CG4 LACTIC ACID, ED  CBG MONITORING, ED  GC/CHLAMYDIA PROBE AMP ([place]) NOT AT [facility]    Imaging Review Dg Abd Acute W/chest  07/18/2015  CLINICAL DATA:  Generalized abdominal pain, nausea EXAM: DG ABDOMEN ACUTE  W/ 1V CHEST COMPARISON:  07/20/ 16 FINDINGS: Mild gaseous distended small bowel loops right abdomen suspicious for ileus or enteritis. Moderate stool noted within distal sigmoid colon and rectum. Cardiomediastinal silhouette is stable. No acute infiltrate or pulmonary edema. IMPRESSION: Mild gaseous distended small bowel loops in left abdomen suspicious for ileus or enteritis. Moderate stool noted distal sigmoid colon and rectum. No acute disease within chest. Electronically Signed   By: Natasha MeadLiviu  Pop M.D.   On: 07/18/2015 13:45   I have personally reviewed and evaluated these images and lab results as part of my medical decision-making.   EKG Interpretation   Date/Time:  Thursday July 18 2015 12:26:48 EDT Ventricular Rate:  66 PR Interval:  137 QRS Duration: 90 QT Interval:  421 QTC Calculation: 441 R Axis:   88 Text Interpretation:  Sinus rhythm Probable anteroseptal infarct, old No  significant change since last tracing Confirmed by BEATON  MD, ROBERT  ([id number]) on 07/18/2015 12:33:05 PM      MDM   Final diagnoses:  Generalized abdominal pain  Dysuria  Constipation, unspecified constipation type  Episodic  lightheadedness  Nausea  Ear pressure, left    44 y.o.  female here with multiple complaints. Primarily near syncope/lightheadedness with standing and lower/mid abd pain, nausea, dysuria, and chills. Some malodorous urine. [facility], on menses now (regular amount of bleeding). States this feels similar to previous pyelonephritis. Also has had 3wks of pressure in L ear, although exam is unremarkable. No recent hematochezia/melena, prior reports of hematochezia with hemorrhoids. On exam, no focal neuro deficits, some diffuse abd tenderness mostly in lower abdomen, +BS throughout, nonperitoneal. No flank tenderness. Will get labs, pelvic exam, then decide on imaging. Will give pain meds and nausea meds. Will get orthostatic VS and give fluids. Will get EKG as well. Will reassess after pelvic.  12:58 PM Orthostatics normal. EKG unremarkable. CBG 88. Lactic WNL. Trop neg. CBC w/diff unremarkable. Upreg neg. Remaining labs pending. Pelvic without discharge or tenderness, doubt pelvic etiology. Will get acute abd series to eval for perf/obstruction. Will reassess shortly.   1:49 PM Pt requests different pain medication instead of morphine, will give toradol instead. Wet prep with few clue cells likely just from the fact that she's on menses, doubt need for treatment. CMP WNL, lipase WNL. U/A with few squamous, few bacteria, 0-2 WBC, likely contaminated. Acute abd series shows mild gaseous distended small bowel loops in right abdomen suspicious for ileus vs enteritis, stool noted in sigmoid colon and rectum. Given her symptoms and altered anatomy due to prior malrotation, will obtain CT abd/pelvis to eval for possible obstruction given the somewhat equivocal results for acute abd series. Will get CT without IV contrast since she states she "broke out in hives" with the IV contrast. Also, pt initially declined zofran thinking it was the medication that made her drowsy last visit  but chart review revealed that it was phenergan that she was given, therefore she accepts zofran now. Pt currently feels improved with respect to lightheadedness/flush feeling. Will reassess shortly.  3:45 PM Pt just now going for CT. Care signed out to Fayrene HelperBowie Tran PA-C who will follow up with CT results and dispo accordingly. Please see his notes for further documentation of care.   BP 131/63 mmHg  Pulse 76  Temp(Src) 97.8 F (36.6 C) (Oral)  Resp 15  SpO2 100%  LMP 07/14/2015  Meds ordered this encounter  Medications  . 0.9 %  sodium chloride  infusion    Sig:    Followed by  . 0.9 %  sodium chloride infusion    Sig:   . ondansetron (ZOFRAN) injection 4 mg    Sig:   . ketorolac (TORADOL) 30 MG/ML injection 30 mg    Sig:   . Barium Sulfate 2.1 % SUSP 450 mL    Sig:      Victoria Ayuso Camprubi-Soms, PA-C 07/18/15 1546  Nelva Nay, MD 07/19/15 [id number]

## 2015-07-18 NOTE — ED Notes (Signed)
Bed: WA20 Expected date:  Expected time:  Means of arrival:  Comments: abd pain 

## 2015-07-18 NOTE — ED Notes (Addendum)
Per EMS- abdominal pain (lower, bilaterally, mid) starting Sunday. Dizziness with flushed face accompanying abdominal pain. Associated nausea. VS 140/98 HR 80 RR 18 SpO2 99%.   Denies lightheadedness, but says sensation is similar to "the feeling right before I would pass out and it feels like I'm going to lose my bowels. I've also been having left ear pain x3 weeks. The flushing feeling is when I stand up. They did orthostatics on me and it was opposite. My blood pressure went up and up. My last echo was normal. I used to have MVP. I was treated for a severe kidney infection-almost septic in July. These symptoms are very similar to that. It burns inside when I pee like my bladder hurts. It burns more after I pee." PA at bedside. Pt reports increased nausea but no vomiting. Denies diarrhea but has had chills. Denies chest pain but endorses some SOB in the "past few weeks." Last BM was yesterday-recently started a probiotic. Denies black/tarry stools but has had scant blood from rectum. Also says, "my head is pounding." Denies hematuria. Denies blurry vision or hearing loss but says, "I feel a lot of pressure in my left ear." No other c/c. Neurologically intact.

## 2015-07-18 NOTE — ED Notes (Signed)
Patient transported to CT 

## 2015-07-19 ENCOUNTER — Ambulatory Visit (INDEPENDENT_AMBULATORY_CARE_PROVIDER_SITE_OTHER): Payer: 59

## 2015-07-19 ENCOUNTER — Telehealth: Payer: Self-pay | Admitting: Cardiology

## 2015-07-19 ENCOUNTER — Other Ambulatory Visit (INDEPENDENT_AMBULATORY_CARE_PROVIDER_SITE_OTHER): Payer: 59

## 2015-07-19 ENCOUNTER — Telehealth: Payer: Self-pay | Admitting: *Deleted

## 2015-07-19 DIAGNOSIS — R002 Palpitations: Secondary | ICD-10-CM

## 2015-07-19 DIAGNOSIS — R55 Syncope and collapse: Secondary | ICD-10-CM

## 2015-07-19 DIAGNOSIS — R232 Flushing: Secondary | ICD-10-CM

## 2015-07-19 DIAGNOSIS — R42 Dizziness and giddiness: Secondary | ICD-10-CM | POA: Diagnosis not present

## 2015-07-19 DIAGNOSIS — R109 Unspecified abdominal pain: Secondary | ICD-10-CM

## 2015-07-19 LAB — HIV ANTIBODY (ROUTINE TESTING W REFLEX): HIV Screen 4th Generation wRfx: NONREACTIVE

## 2015-07-19 LAB — GC/CHLAMYDIA PROBE AMP (~~LOC~~) NOT AT ARMC
Chlamydia: NEGATIVE
Neisseria Gonorrhea: NEGATIVE

## 2015-07-19 LAB — RPR: RPR Ser Ql: NONREACTIVE

## 2015-07-19 NOTE — Telephone Encounter (Signed)
   Quintella Reichertraci R Turner, MD at 07/19/2015 11:41 AM     Status: Signed       Expand All Collapse All   Please order a 30 day event monitor to assess palpitations.          Quintella Reichertraci R Turner, MD at 07/19/2015 11:37 AM     Status: Signed       Expand All Collapse All   Delice Bisonara was seen in ER yesterday with dizziness/presyncope in the setting of severe flushing, palpitations and abdominal pain. Workup in the ER normal including Abdominal CT and labs. She has history of episodic headaches. She recently has had problems with elevated BP and yesterdays episode her BP was 150/16110mmHg. She describes episodes where she becomes overwhelmingly flushed. Please order a 24 hour urine for catecholamines, norepinephrine, epinephrine, 5 HIAA, dopamine, metanephrines, cortisol. Please also check a aldo/renin blood level.        Orders placed. Pt informed.

## 2015-07-19 NOTE — Telephone Encounter (Signed)
Please order a 30 day event monitor to assess palpitations.

## 2015-07-19 NOTE — Telephone Encounter (Signed)
Victoria Barr was seen in ER yesterday with dizziness/presyncope in the setting of severe flushing, palpitations and abdominal pain.  Workup in the ER normal including Abdominal CT and labs.  She has history of episodic headaches.  She recently has had problems with elevated BP and yesterdays episode her BP was 150/12910mmHg.  She describes episodes where she becomes overwhelmingly flushed.  Please order a 24 hour urine for catecholamines, norepinephrine, epinephrine, 5 HIAA, dopamine, metanephrines, cortisol.  Please also check a aldo/renin blood level.

## 2015-07-23 ENCOUNTER — Encounter: Payer: Self-pay | Admitting: Physician Assistant

## 2015-07-23 ENCOUNTER — Ambulatory Visit (INDEPENDENT_AMBULATORY_CARE_PROVIDER_SITE_OTHER): Payer: 59 | Admitting: Physician Assistant

## 2015-07-23 VITALS — BP 138/81 | HR 87 | Temp 97.9°F | Resp 16 | Ht 67.5 in | Wt 166.0 lb

## 2015-07-23 DIAGNOSIS — J019 Acute sinusitis, unspecified: Secondary | ICD-10-CM

## 2015-07-23 MED ORDER — CEFDINIR 300 MG PO CAPS: 600.0000 mg | ORAL_CAPSULE | Freq: Every day | ORAL | Status: DC

## 2015-07-23 MED ORDER — PREDNISONE 20 MG PO TABS
ORAL_TABLET | ORAL | Status: DC
Start: 2015-07-23 — End: 2015-09-11

## 2015-07-23 NOTE — Patient Instructions (Signed)
Get plenty of rest and drink at least 64 ounces of water daily. 

## 2015-07-23 NOTE — Progress Notes (Signed)
Patient ID: Victoria Barr, female    DOB: 26-Feb-1971, 44 y.o.   MRN: [id number]  PCP: Olene Floss  Subjective:   Chief Complaint  Patient presents with  . Hospitalization Follow-up    dizziness, pre-syncope  . Depression    HPI Presents for evaluation of dizziness.  "It's all day, constantly." Some days are better than others, seems to occur more often when she's sitting down, and is actually intermittent, though she notes it's present more than it's not. Comes in waves, overwhelming heat and cold intolerance in alternation.  First episode was 10/24, while in a friend's kitchen. Sudden onset, associated with profuse diaphoresis, abdominal pain, urge to defecate and the feeling that she would pass out.  Lied down and rested, and it resolved. She recalls nothing out of the ordinary that she ate, denies drinking any alcohol or using any drugs the evening before.  Recurred last Thursday 07/18/2015. 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  Excused herself from the patient and became pale and unable to stand.  She was taken into an exam room for evaluation. She had a terrible urge to have a BM.  158/110. Orthostatic measurements revealed INCREASED BP with sit, then stand. She was taken to the ED by EMS.   Evaluation in the ED included UA, UCG (negative), HIV (negative), RPR (negative), lipase (normal), CMET (normal except for glucose 102), CBC (normal), troponin x 1 (not elevated), Lactic Acid (normal), and vaginal wet prep (normal except for a few clue cells). Acute Abdominal Series films revealed mild gaseous distended small bowel loops in the LEFT abdomen suspicious for ileus or enteritis. CT of the abdomen and pelvis identified no cause of her symptoms.   AS there was no focal neurologic deficit, nothing to suggest meningitis, she was able to ambulate with a steady gait, she was discharged with "likely sinus." Meclizine prescribed for possible BPPV just  filled today, and hasn't taken any. One of the cardiologists in the office where she works has ordered urine metanephrines, cortisol, aldosterone + renin activity, but they haven't been collected yet.  Is able to eat and drink normally. No diarrhea. The BM she has with her symptoms is normal-formed and brown, not hard. No vomiting, but has nausea. A dose of Zofran in the ED.  "Very shortness of breath. Gasping for air all the time. It's constant." "Tends to happen after I eat or drink something. I feel a fullness or tightness in my chest. I went to an urgent care and he thought it was a chest thing. I saw the gastro lady, who thought it was GERD and gave me the omeprazole. It doesn't help.  Have you ever had anaphylaxis? 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 Had surgery to repair that and has lots of constipation and stomach pain.   Reports headache all last week. "I've had sinus infections all my life. I know the difference between a tension headache and another headache." Has been having a lot of trouble with the LEFT ear. Fullness. Trying to use a netty pot.  Review of Systems As above.    Patient Active Problem List   Diagnosis Date Noted  . Vitamin D deficiency 05/23/2015  . Depression 05/23/2015  . Acute infection of nasal sinus 04/25/2015  . Bilateral leg pain 11/13/2014  . Smoker 05/02/2014  .  LGSIL (low grade squamous intraepithelial dysplasia) 05/02/2014  . VIN I (vulvar intraepithelial neoplasia I) 05/02/2014  . Chest pain 11/30/2013  . Abnormal EKG 11/30/2013  . Palpitations   . MVP (mitral valve prolapse)   . Dysuria 06/09/2013     Prior to Admission medications   Medication Sig Start Date End Date Taking? Authorizing Provider  Cholecalciferol (VITAMIN D) 2000 UNITS CAPS Take 2,000 Units by mouth daily.   Yes Historical  Provider, MD  diclofenac (VOLTAREN) 75 MG EC tablet Take 75 mg by mouth daily as needed (back pain).   Yes Historical Provider, MD  guaiFENesin (MUCINEX) 600 MG 12 hr tablet Take 600 mg by mouth daily as needed for cough.   Yes Historical Provider, MD  meclizine (ANTIVERT) 25 MG tablet Take 1 tablet (25 mg total) by mouth 3 (three) times daily as needed for dizziness or nausea. 07/18/15  Yes Fayrene Helper, PA-C  nitrofurantoin, macrocrystal-monohydrate, (MACROBID) 100 MG capsule Take 100 mg by mouth daily as needed (uti symptoms).  06/04/15  Yes Historical Provider, MD  omeprazole (PRILOSEC) 20 MG capsule Take 20 mg by mouth daily. 06/28/15  Yes Historical Provider, MD  Probiotic CAPS Take 1 capsule by mouth daily.   Yes Historical Provider, MD     Allergies  Allergen Reactions  . Imitrex [Sumatriptan] Hives and Anaphylaxis  . Iohexol Hives    Patient must pre medicated if having an exam with contrast per Dr. Maxwell// rls  . Bupropion Hives  . Prozac [Fluoxetine Hcl] Hives  . Penicillins Hives    Has patient had a PCN reaction causing immediate rash, facial/tongue/throat swelling, SOB or lightheadedness with hypotension: Yes Has patient had a PCN reaction causing severe rash involving mucus membranes or skin necrosis: Yes Has patient had a PCN reaction that required hospitalization Yes Has patient had a PCN reaction occurring within the last 10 years: No If all of the above answers are "NO", then may proceed with Cephalosporin use.   . Sulfa Antibiotics Hives  . Zoloft [Sertraline Hcl] Hives       Objective:  Physical Exam  Constitutional: She is oriented to person, place, and time. She appears well-developed and well-nourished. She is active and cooperative.  Non-toxic appearance. She does not have a sickly appearance. She does not appear ill. No distress.  BP 138/81 mmHg  Pulse 87  Temp(Src) 97.9 F (36.6 C)  Resp 16  Ht 5' 7.5" (1.715 m)  Wt 166 lb (75.297 kg)  BMI 25.60 kg/m2   LMP 07/14/2015  HENT:  Head: Normocephalic and atraumatic.  Right Ear: Hearing, tympanic membrane, external ear and ear canal normal.  Left Ear: Hearing, external ear and ear canal normal. Tympanic membrane is scarred. Left ear retracted TM: air bubble noted behind the TM.  Nose: Right sinus exhibits no maxillary sinus tenderness and no frontal sinus tenderness. Left sinus exhibits maxillary sinus tenderness. Left sinus exhibits no frontal sinus tenderness.  Mouth/Throat: Uvula is midline, oropharynx is clear and moist and mucous membranes are normal. No oral lesions. Normal dentition. No uvula swelling.  Eyes: Conjunctivae, EOM and lids are normal. Pupils are equal, round, and reactive to light. No scleral icterus.  Fundoscopic exam:      The right eye shows no hemorrhage and no papilledema.       The left eye shows no hemorrhage and no papilledema.  Neck: Normal range of motion and phonation normal. Neck supple. No thyromegaly present.  Cardiovascular: Normal rate, regular rhythm and normal heart sounds.  Pulses:      Radial pulses are 2+ on the right side, and 2+ on the left side.  Pulmonary/Chest: Effort normal and breath sounds normal.  Lymphadenopathy:       Head (right side): No tonsillar, no preauricular, no posterior auricular and no occipital adenopathy present.       Head (left side): No tonsillar, no preauricular, no posterior auricular and no occipital adenopathy present.    She has no cervical adenopathy.       Right: No supraclavicular adenopathy present.       Left: No supraclavicular adenopathy present.  Neurological: She is alert and oriented to person, place, and time. She has normal strength. No sensory deficit.  Skin: Skin is warm, dry and intact. No rash noted. She is not diaphoretic. No cyanosis or erythema. Nails show no clubbing.  Psychiatric: She has a normal mood and affect. Her behavior is normal. Judgment and thought content normal. Her speech is tangential  (mildly). Her speech is not rapid and/or pressured, not delayed and not slurred. Cognition and memory are normal. She is communicative.           Assessment & Plan:   1. Acute sinusitis, recurrence not specified, unspecified location While the sudden and overwhelming urge to defecate is certainly unusual with sinusitis, and may not be related, we agree to treat for this possibility. If her symptoms are not improved in 24-48 hours, or certainly if they worsen, plan CT head without contrast. OOW today and tomorrow. If she is improving, but not well to return on Thursday, I would extend the work note. - cefdinir (OMNICEF) 300 MG capsule; Take 2 capsules (600 mg total) by mouth daily.  Dispense: 20 capsule; Refill: 0 - predniSONE (DELTASONE) 20 MG tablet; Take 3 PO QAM x3days, 2 PO QAM x3days, 1 PO QAM x3days  Dispense: 18 tablet; Refill: 0   Fernande Brashelle S. Jaskarn Schweer, PA-C Physician Assistant-Certified [facility]

## 2015-07-24 ENCOUNTER — Telehealth: Payer: Self-pay

## 2015-07-24 DIAGNOSIS — R51 Headache: Secondary | ICD-10-CM

## 2015-07-24 DIAGNOSIS — R519 Headache, unspecified: Secondary | ICD-10-CM

## 2015-07-24 DIAGNOSIS — R42 Dizziness and giddiness: Secondary | ICD-10-CM

## 2015-07-24 NOTE — Telephone Encounter (Signed)
Patient had [employer] fax over forms to be completed by Chelle, i have filled out what I could from the office notes and highlighted the areas that need to be finished. When completed please return to the 102 check out box within 5-7 business days. Thank you!! i am placing them in your box on 07/24/15.

## 2015-07-25 ENCOUNTER — Encounter: Payer: Self-pay | Admitting: *Deleted

## 2015-07-25 NOTE — Telephone Encounter (Signed)
OK to extend note.  Order for CT scan placed.

## 2015-07-25 NOTE — Telephone Encounter (Signed)
Tanda RockersKaya A Jackson at 07/25/2015 9:48 AM     Status: Signed       Expand All Collapse All   Patient is calling because she still has headaches and and shortness of breath and needs a doctors note to return to work Monday 11/7.  Patient also wants to follow up on being referred for a cat scan. Please call! [phone]              She wanted to ask about the cat scan because she is not feeling better. Her ear is still hurting also. She would also like a note out of work until Monday. Please advise.

## 2015-07-25 NOTE — Telephone Encounter (Signed)
Pt notified note ready and ct has been ordered

## 2015-07-28 NOTE — Telephone Encounter (Signed)
Forms completed for [employer] and placed in the 102 check out box.

## 2015-07-29 ENCOUNTER — Telehealth: Payer: Self-pay | Admitting: Physician Assistant

## 2015-07-29 ENCOUNTER — Encounter: Payer: Self-pay | Admitting: Physician Assistant

## 2015-07-29 LAB — ALDOSTERONE + RENIN ACTIVITY W/ RATIO
ALDO / PRA Ratio: 4.9 Ratio (ref 0.9–28.9)
Aldosterone: 4 ng/dL
PRA LC/MS/MS: 0.82 ng/mL/h (ref 0.25–5.82)

## 2015-07-29 NOTE — Telephone Encounter (Signed)
Completed FMLA forms [facility]. They have been scanned and faxed to [employer].

## 2015-07-30 ENCOUNTER — Ambulatory Visit (HOSPITAL_COMMUNITY)
Admission: RE | Admit: 2015-07-30 | Discharge: 2015-07-30 | Disposition: A | Discharge status: Home / Self Care | Payer: 59 | Source: Ambulatory Visit | Attending: Physician Assistant | Admitting: Physician Assistant

## 2015-07-30 ENCOUNTER — Other Ambulatory Visit: Payer: Self-pay | Admitting: Physician Assistant

## 2015-07-30 DIAGNOSIS — R51 Headache: Secondary | ICD-10-CM | POA: Diagnosis not present

## 2015-07-30 DIAGNOSIS — R55 Syncope and collapse: Secondary | ICD-10-CM

## 2015-07-30 DIAGNOSIS — R519 Headache, unspecified: Secondary | ICD-10-CM

## 2015-07-30 DIAGNOSIS — R42 Dizziness and giddiness: Secondary | ICD-10-CM | POA: Diagnosis not present

## 2015-08-14 ENCOUNTER — Ambulatory Visit: Payer: 59 | Admitting: Neurology

## 2015-09-11 ENCOUNTER — Encounter: Payer: Self-pay | Admitting: Physician Assistant

## 2015-09-11 ENCOUNTER — Ambulatory Visit (INDEPENDENT_AMBULATORY_CARE_PROVIDER_SITE_OTHER): Payer: 59 | Admitting: Physician Assistant

## 2015-09-11 VITALS — BP 101/67 | HR 82 | Temp 98.4°F | Resp 18 | Ht 68.5 in | Wt 167.2 lb

## 2015-09-11 DIAGNOSIS — E559 Vitamin D deficiency, unspecified: Secondary | ICD-10-CM

## 2015-09-11 DIAGNOSIS — R5383 Other fatigue: Secondary | ICD-10-CM

## 2015-09-11 DIAGNOSIS — R42 Dizziness and giddiness: Secondary | ICD-10-CM | POA: Diagnosis not present

## 2015-09-11 DIAGNOSIS — J0110 Acute frontal sinusitis, unspecified: Secondary | ICD-10-CM | POA: Diagnosis not present

## 2015-09-11 DIAGNOSIS — J302 Other seasonal allergic rhinitis: Secondary | ICD-10-CM

## 2015-09-11 MED ORDER — DOXYCYCLINE HYCLATE 100 MG PO CAPS: 100.0000 mg | ORAL_CAPSULE | Freq: Two times a day (BID) | ORAL | Status: AC

## 2015-09-11 NOTE — Patient Instructions (Signed)
Doxycycline twice a day for 7 days. Continue your nasal spray. STart on claritin or allergra daily. Get set up for your allergy shots. Start back on vit D. STart back on probiotic. Return in the new year if you are still having problems with fatigue.

## 2015-09-11 NOTE — Progress Notes (Signed)
[facility] [address], [place] [address] [phone]  Date:  09/11/2015   Name:  Victoria Barr   DOB:  Feb 05, 1971   MRN:  [id number]  PCP:  JEFFERY,CHELLE, PA-C    Chief Complaint: Headache   History of Present Illness:  This is a 44 y.o. female with PMH recurrent sinusitis, allergic rhinitis, palpitations, vit D deficiency who is presenting with frontal headache x 4 days. She states it feels like sinus infection to her. She is starting to feel a little dizzy. She has sore throat. She states she always has nasal congestion. She has mild right otalgia. She denies cough, fever, chills, sob/wheezing.  6 weeks ago she had sinus infection that causes severe dizziness with assoc urge to defecate, diaphoresis, abdominal pain. She was seen in the ED for this - eval included UA, UCG (negative), HIV (negative), RPR (negative), lipase (normal), CMET (normal except for glucose 102), CBC (normal), troponin x 1 (not elevated), Lactic Acid (normal), and vaginal wet prep (normal except for a few clue cells). Acute Abdominal Series films revealed mild gaseous distended small bowel loops in the LEFT abdomen suspicious for ileus or enteritis. CT of the abdomen and pelvis identified no cause of her symptoms. She was seen in the office by Porfirio Oar who gave her 10 day course of omnicef. Symptoms were not improving after 48 hours so was sent for CT head which was normal. holter monitor normal at that time as well. She finally started to feel better after 7-8 days of omnicef.  She was referred to ENT and allergist. Saw ENT 1 month ago. He recommended surgery for deviated septum. She states she cannot afford to have surgery at this time. ENT put her on "a 3 day antibiotic" for a "really red throat".  Saw allergist 1 month ago as well. She was told she was allergic to dust mites. She is supposed to start allergy shots but hasn't started yet. She has been using a nasal spray from  allergist called dynmista which helps. She is supposed to start on oral antihistamine but hasn't started yet.  Pt states for the past 2 months she has felt chronically fatigued. She gets 8 hours of sleep at night but always feels like she could go back to sleep. She does not snore. She had TSH checked 01/2015 and normal. She used to take vit D supplementation but ran out a few months ago.  Review of Systems:  Review of Systems See HPI  Patient Active Problem List   Diagnosis Date Noted  . Vitamin D deficiency 05/23/2015  . Depression 05/23/2015  . Acute infection of nasal sinus 04/25/2015  . Bilateral leg pain 11/13/2014  . Smoker 05/02/2014  . LGSIL (low grade squamous intraepithelial dysplasia) 05/02/2014  . VIN I (vulvar intraepithelial neoplasia I) 05/02/2014  . Chest pain 11/30/2013  . Abnormal EKG 11/30/2013  . Palpitations   . MVP (mitral valve prolapse)   . Dysuria 06/09/2013    Prior to Admission medications   Medication Sig Start Date End Date Taking? Authorizing Provider  diclofenac (VOLTAREN) 75 MG EC tablet Take 75 mg by mouth daily as needed (back pain).   Yes Historical Provider, MD  nitrofurantoin, macrocrystal-monohydrate, (MACROBID) 100 MG capsule Take 100 mg by mouth daily as needed (uti symptoms).  06/04/15  Yes Historical Provider, MD  Probiotic CAPS Take 1 capsule by mouth daily.   Yes Historical Provider, MD  Allergies  Allergen Reactions  . Imitrex [Sumatriptan] Hives and Anaphylaxis  . Iohexol Hives    Patient must pre medicated if having an exam with contrast per Dr. Maxwell// rls  . Bupropion Hives  . Prozac [Fluoxetine Hcl] Hives  . Penicillins Hives    Has patient had a PCN reaction causing immediate rash, facial/tongue/throat swelling, SOB or lightheadedness with hypotension: Yes Has patient had a PCN reaction causing severe rash involving mucus membranes or skin necrosis: Yes Has patient had a PCN reaction that required  hospitalization Yes Has patient had a PCN reaction occurring within the last 10 years: No If all of the above answers are "NO", then may proceed with Cephalosporin use.   . Sulfa Antibiotics Hives  . Zoloft [Sertraline Hcl] Hives    Past Surgical History  Procedure Laterality Date  . Intestinal malrotation repair  1999  . Tubal ligation    . Appendectomy  1999  . Colposcopy  2006, 2015    2006 LGSIL, normal since; 2015 LGSIL w/LEEP  . Excision morton's neuroma Bilateral 2002  . Nasal sinus surgery    . Cervical biopsy  w/ loop electrode excision  11/28/14    LGSIL with negative margins    Social History  Substance Use Topics  . Smoking status: Former Games developermoker  . Smokeless tobacco: Never Used     Comment: quit 1 year ago, sep 2013  . Alcohol Use: No     Comment: occasionally    Family History  Problem Relation Age of Onset  . Heart disease Mother   . Hypertension Mother   . Aneurysm Father 36    brain   . Hypertension Sister   . Heart disease Maternal Grandmother   . Cancer Paternal Grandmother   . Cancer Paternal Grandfather     Medication list has been reviewed and updated.  Physical Examination:  Physical Exam  Constitutional: She is oriented to person, place, and time. She appears well-developed and well-nourished. No distress.  HENT:  Head: Normocephalic and atraumatic.  Right Ear: Hearing, tympanic membrane, external ear and ear canal normal.  Left Ear: Hearing, tympanic membrane, external ear and ear canal normal.  Nose: No mucosal edema. Right sinus exhibits frontal sinus tenderness. Right sinus exhibits no maxillary sinus tenderness. Left sinus exhibits frontal sinus tenderness (L>R). Left sinus exhibits no maxillary sinus tenderness.  Mouth/Throat: Uvula is midline and mucous membranes are normal. Posterior oropharyngeal erythema present. No oropharyngeal exudate or posterior oropharyngeal edema.  Eyes: Conjunctivae and lids are normal. Right eye exhibits no  discharge. Left eye exhibits no discharge. No scleral icterus.  Cardiovascular: Normal rate, regular rhythm, normal heart sounds and normal pulses.   No murmur heard. Pulmonary/Chest: Effort normal and breath sounds normal. No respiratory distress. She has no wheezes. She has no rhonchi. She has no rales.  Musculoskeletal: Normal range of motion.  Lymphadenopathy:       Head (right side): No submental, no submandibular and no tonsillar adenopathy present.       Head (left side): No submental, no submandibular and no tonsillar adenopathy present.    She has no cervical adenopathy.  Neurological: She is alert and oriented to person, place, and time. She has normal strength and normal reflexes. No cranial nerve deficit or sensory deficit. Gait normal.  Skin: Skin is warm, dry and intact. No lesion and no rash noted.  Psychiatric: She has a normal mood and affect. Her speech is normal and behavior is normal. Thought content normal.  BP 101/67 mmHg  Pulse 82  Temp(Src) 98.4 F (36.9 C) (Oral)  Resp 18  Ht 5' 8.5" (1.74 m)  Wt 167 lb 3.2 oz (75.841 kg)  BMI 25.05 kg/m2  SpO2 97%  LMP 09/03/2015  Assessment and Plan:  1. Acute frontal sinusitis, recurrence not specified 2. Dizziness 3. Seasonal allergies Suspect sinusitis - similar symptoms in the past with sinusitis. Neuro exam negative. Prescribed doxy bid x 7 days to prevent worsening of symptoms. Had extensive work up 6 weeks ago for dizziness d/t sinus infection that was all ultimately negative. We discussed the negative side effects of being on antibiotics so frequently. I advised she take a probiotic daily. She should continue nasal spray and start on oral antihistamine. She needs to schedule for allergy shots asap. Return in 1 week if symptoms not improving or at any time if symptoms worsen. - doxycycline (VIBRAMYCIN) 100 MG capsule; Take 1 capsule (100 mg total) by mouth 2 (two) times daily. AVOID EXCESS SUN EXPOSURE WHILE ON THIS  MEDICATION  Dispense: 14 capsule; Refill: 0  4. Other fatigue 5. Vitamin D deficiency Worsening fatigue over the past 2 months. Could be d/t recurrent sinus infections. TSH normal 6 months ago. She does not snore. Sleeps 8 hours at night. She stopped her vit D supplements a couple months ago because she ran out. Advised she restart vit D and return in 1 month for follow up with Porfirio Oar, PAC.   [place] Brackett, MHS [facility]  09/11/2015

## 2015-09-24 ENCOUNTER — Ambulatory Visit (INDEPENDENT_AMBULATORY_CARE_PROVIDER_SITE_OTHER): Payer: 59 | Admitting: Physician Assistant

## 2015-09-24 VITALS — BP 132/76 | HR 87 | Temp 98.2°F | Resp 16 | Ht 69.0 in | Wt 167.0 lb

## 2015-09-24 DIAGNOSIS — R300 Dysuria: Secondary | ICD-10-CM | POA: Diagnosis not present

## 2015-09-24 DIAGNOSIS — Z87440 Personal history of urinary (tract) infections: Secondary | ICD-10-CM | POA: Diagnosis not present

## 2015-09-24 DIAGNOSIS — Z87448 Personal history of other diseases of urinary system: Secondary | ICD-10-CM

## 2015-09-24 LAB — POCT URINALYSIS DIP (MANUAL ENTRY)
Bilirubin, UA: NEGATIVE
Glucose, UA: NEGATIVE
Ketones, POC UA: NEGATIVE
Leukocytes, UA: NEGATIVE
Nitrite, UA: NEGATIVE
Protein Ur, POC: NEGATIVE
Spec Grav, UA: 1.02
Urobilinogen, UA: 0.2
pH, UA: 7

## 2015-09-24 LAB — POCT CBC
Granulocyte percent: 75.3 %G (ref 37–80)
HCT, POC: 44.6 % (ref 37.7–47.9)
Hemoglobin: 15.5 g/dL (ref 12.2–16.2)
Lymph, poc: 1.6 (ref 0.6–3.4)
MCH, POC: 30.4 pg (ref 27–31.2)
MCHC: 34.8 g/dL (ref 31.8–35.4)
MCV: 87.2 fL (ref 80–97)
MID (cbc): 0.4 (ref 0–0.9)
MPV: 7.3 fL (ref 0–99.8)
POC Granulocyte: 6.2 (ref 2–6.9)
POC LYMPH PERCENT: 19.6 %L (ref 10–50)
POC MID %: 5.1 %M (ref 0–12)
Platelet Count, POC: 280 10*3/uL (ref 142–424)
RBC: 5.12 M/uL (ref 4.04–5.48)
RDW, POC: 14.2 %
WBC: 8.3 10*3/uL (ref 4.6–10.2)

## 2015-09-24 LAB — POC MICROSCOPIC URINALYSIS (UMFC): Mucus: ABSENT

## 2015-09-24 NOTE — Patient Instructions (Signed)
Drink plenty of water (at least 64 oz per day). Cranberry juice/pills can help. Continue the pyridium as needed for discomfort. Stop the macrobid. Call [facility] and tell them I put in an urgent referral and you want the soonest appt available. I will call you with results from your urine culture. Take miralax twice a day until stools are loose and then once a day for 1 week.

## 2015-09-24 NOTE — Progress Notes (Signed)
[facility] [address] [place] [address] [phone]  Date:  09/24/2015   Name:  Victoria Barr   DOB:  02-08-1971   MRN:  [id number]  PCP:  JEFFERY,CHELLE, PA-C    Chief Complaint: Dysuria   History of Present Illness:  This is a 45 y.o. female with PMH recurrent sinus infection, recurrent UTI, hx pyelonephritis, MVP who is presenting with dysuria x 3 days.   Dysuria: yes. States it burns on the outside and the inside Urinary Frequency: yes Vaginal discharge: no No vaginal itching Hematuria: no Abdominal pain: lower Fever/chills: feels feverish but normal temps Nausea/vominting: nausea but no vomiting Back pain: yes, lower. R>L LMP: 09/10/15 Sexually active: yes. No new partners. No concern for STDs Previous UTI: yes, recurrent Aggravating/Alleviating factors: Took three doses macrobid and symptoms continued to worsen. Also tried pyridium without much help.  Hx R pyelonephritis 03/2015. UA and urine cultures negative at that time. dx'd by abdominal CT. Had outpt follow up with urologist at [facility]. Unsure of provider name. Urologist is who gave her macrobid to take 3 doses at start of symptoms. Having normal BMs - going 1-2 times a day. Consistency - soft and hard, variable.  Pt just recently finished a 10 day course of doxy for sinusitis. 1 month prior she was on several different abx for sinusitis as well.  Review of Systems:  Review of Systems See HPI  Patient Active Problem List   Diagnosis Date Noted  . Vitamin D deficiency 05/23/2015  . Depression 05/23/2015  . Acute infection of nasal sinus 04/25/2015  . Bilateral leg pain 11/13/2014  . Smoker 05/02/2014  . LGSIL (low grade squamous intraepithelial dysplasia) 05/02/2014  . VIN I (vulvar intraepithelial neoplasia I) 05/02/2014  . Chest pain 11/30/2013  . Abnormal EKG 11/30/2013  . Palpitations   . MVP (mitral valve prolapse)   . Dysuria 06/09/2013    Prior to  Admission medications   Medication Sig Start Date End Date Taking? Authorizing Provider  diclofenac (VOLTAREN) 75 MG EC tablet Take 75 mg by mouth daily as needed (back pain).   Yes Historical Provider, MD  nitrofurantoin, macrocrystal-monohydrate, (MACROBID) 100 MG capsule Take 100 mg by mouth daily as needed (uti symptoms). Reported on 09/24/2015 06/04/15  Yes Historical Provider, MD  Probiotic CAPS Take 1 capsule by mouth daily.   Yes Historical Provider, MD       Historical Provider, MD       Fayrene Helper, PA-C    Allergies  Allergen Reactions  . Imitrex [Sumatriptan] Hives and Anaphylaxis  . Iohexol Hives    Patient must pre medicated if having an exam with contrast per Dr. Maxwell// rls  . Bupropion Hives  . Prozac [Fluoxetine Hcl] Hives  . Penicillins Hives    Has patient had a PCN reaction causing immediate rash, facial/tongue/throat swelling, SOB or lightheadedness with hypotension: Yes Has patient had a PCN reaction causing severe rash involving mucus membranes or skin necrosis: Yes Has patient had a PCN reaction that required hospitalization Yes Has patient had a PCN reaction occurring within the last 10 years: No If all of the above answers are "NO", then may proceed with Cephalosporin use.   . Sulfa Antibiotics Hives  . Zoloft [Sertraline Hcl] Hives    Past Surgical History  Procedure Laterality Date  . Intestinal malrotation repair  1999  . Tubal ligation    . Appendectomy  1999  . Colposcopy  2006, 2015  2006 LGSIL, normal since; 2015 LGSIL w/LEEP  . Excision morton's neuroma Bilateral 2002  . Nasal sinus surgery    . Cervical biopsy  w/ loop electrode excision  11/28/14    LGSIL with negative margins    Social History  Substance Use Topics  . Smoking status: Current Some Day Smoker  . Smokeless tobacco: Never Used     Comment: quit 1 year ago, sep 2013  . Alcohol Use: No     Comment: occasionally    Family History  Problem Relation Age of Onset  . Heart  disease Mother   . Hypertension Mother   . Aneurysm Father 36    brain   . Hypertension Sister   . Heart disease Maternal Grandmother   . Cancer Paternal Grandmother   . Cancer Paternal Grandfather     Medication list has been reviewed and updated.  Physical Examination:  Physical Exam  Constitutional: She is oriented to person, place, and time. She appears well-developed and well-nourished. No distress.  HENT:  Head: Normocephalic and atraumatic.  Right Ear: Hearing normal.  Left Ear: Hearing normal.  Nose: Nose normal.  Eyes: Conjunctivae and lids are normal. Right eye exhibits no discharge. Left eye exhibits no discharge. No scleral icterus.  Cardiovascular: Normal rate, regular rhythm, normal heart sounds and normal pulses.   No murmur heard. Pulmonary/Chest: Effort normal and breath sounds normal. No respiratory distress. She has no wheezes. She has no rhonchi. She has no rales.  Abdominal: Soft. Normal appearance. There is tenderness in the suprapubic area.    Mild right low back tenderness but no true CVA tenderness  Musculoskeletal: Normal range of motion.  Lymphadenopathy:       Head (right side): No submental, no submandibular and no tonsillar adenopathy present.       Head (left side): No submental, no submandibular and no tonsillar adenopathy present.    She has no cervical adenopathy.  Neurological: She is alert and oriented to person, place, and time.  Skin: Skin is warm, dry and intact. No lesion and no rash noted.  Psychiatric: She has a normal mood and affect. Her speech is normal and behavior is normal. Thought content normal.   BP 132/76 mmHg  Pulse 87  Temp(Src) 98.2 F (36.8 C)  Resp 16  Ht 5\' 9"  (1.753 m)  Wt 167 lb (75.751 kg)  BMI 24.65 kg/m2  SpO2 98%  LMP 09/03/2015  Results for orders placed or performed in visit on 09/24/15  POCT urinalysis dipstick  Result Value Ref Range   Color, UA yellow yellow   Clarity, UA clear clear   Glucose,  UA negative negative   Bilirubin, UA negative negative   Ketones, POC UA negative negative   Spec Grav, UA 1.020    Blood, UA trace-intact (A) negative   pH, UA 7.0    Protein Ur, POC negative negative   Urobilinogen, UA 0.2    Nitrite, UA Negative Negative   Leukocytes, UA Negative Negative  POCT Microscopic Urinalysis ([facility])  Result Value Ref Range   WBC,UR,HPF,POC None None WBC/hpf   RBC,UR,HPF,POC None None RBC/hpf   Bacteria Few (A) None, Too numerous to count   Mucus Absent Absent   Epithelial Cells, UR Per Microscopy Few (A) None, Too numerous to count cells/hpf   Results for orders placed or performed in visit on 09/24/15  POCT urinalysis dipstick  Result Value Ref Range   Color, UA yellow yellow   Clarity, UA clear clear  Glucose, UA negative negative   Bilirubin, UA negative negative   Ketones, POC UA negative negative   Spec Grav, UA 1.020    Blood, UA trace-intact (A) negative   pH, UA 7.0    Protein Ur, POC negative negative   Urobilinogen, UA 0.2    Nitrite, UA Negative Negative   Leukocytes, UA Negative Negative  POCT Microscopic Urinalysis ([facility])  Result Value Ref Range   WBC,UR,HPF,POC None None WBC/hpf   RBC,UR,HPF,POC None None RBC/hpf   Bacteria Few (A) None, Too numerous to count   Mucus Absent Absent   Epithelial Cells, UR Per Microscopy Few (A) None, Too numerous to count cells/hpf  POCT CBC  Result Value Ref Range   WBC 8.3 4.6 - 10.2 K/uL   Lymph, poc 1.6 0.6 - 3.4   POC LYMPH PERCENT 19.6 10 - 50 %L   MID (cbc) 0.4 0 - 0.9   POC MID % 5.1 0 - 12 %M   POC Granulocyte 6.2 2 - 6.9   Granulocyte percent 75.3 37 - 80 %G   RBC 5.12 4.04 - 5.48 M/uL   Hemoglobin 15.5 12.2 - 16.2 g/dL   HCT, POC 96.0 45.4 - 47.9 %   MCV 87.2 80 - 97 fL   MCH, POC 30.4 27 - 31.2 pg   MCHC 34.8 31.8 - 35.4 g/dL   RDW, POC 09.8 %   Platelet Count, POC 280 142 - 424 K/uL   MPV 7.3 0 - 99.8 fL    Assessment and Plan:  1. Dysuria 2. History  pyelonephritis Negative UA and culture without significant growth. CBC wnl. Interestingly, pt was dx'd with pyelo 6 months ago with negative UAs and cultures - dx'd by CT. I would caution against putting pt on abx esp with taking so many abx recently. Referred urgently back to [facility] for further evaluation. - POCT urinalysis dipstick - POCT Microscopic Urinalysis ([facility]) - Urine culture - POCT CBC - Ambulatory referral to Urology   Roswell Miners. Dyke Brackett, MHS [facility]  09/26/2015

## 2015-09-25 DIAGNOSIS — R3915 Urgency of urination: Secondary | ICD-10-CM | POA: Diagnosis not present

## 2015-09-25 DIAGNOSIS — R351 Nocturia: Secondary | ICD-10-CM | POA: Diagnosis not present

## 2015-09-25 DIAGNOSIS — Z87440 Personal history of urinary (tract) infections: Secondary | ICD-10-CM | POA: Diagnosis not present

## 2015-09-25 DIAGNOSIS — R300 Dysuria: Secondary | ICD-10-CM | POA: Diagnosis not present

## 2015-09-25 LAB — URINE CULTURE: Colony Count: 7000

## 2015-09-25 MED FILL — TRIMETHOPRIM 100 MG TABLET: 100 | 7 days supply | Qty: 14 | Fill #0

## 2015-09-26 ENCOUNTER — Telehealth: Payer: Self-pay

## 2015-09-26 NOTE — Telephone Encounter (Signed)
Patient called to request her labwork results and to request that they are sent to [facility] once they are available.  She said the urologist needs to view them as soon as possible to know how to proceed with treatment at their office.  Please advise, thank you.  CB#: [phone]

## 2015-09-27 ENCOUNTER — Telehealth: Payer: Self-pay | Admitting: Physician Assistant

## 2015-09-27 NOTE — Telephone Encounter (Signed)
FMLA forms completed. They need review and signature. [facility] when finished.

## 2015-09-27 NOTE — Telephone Encounter (Signed)
Labs should be sent by referrals dept when doing referral for patient.

## 2015-09-30 NOTE — Addendum Note (Signed)
Addended by: Feliciano Wynter, Carmelina DaneJEFFERY S on: 09/30/2015 04:20 PM   Modules accepted: Kipp BroodSmartSet

## 2015-09-30 NOTE — Telephone Encounter (Signed)
FMLA forms filled out. Forms in FMLA box.

## 2015-09-30 NOTE — Progress Notes (Signed)
  Medical screening examination/treatment/procedure(s) were performed by non-physician practitioner and as supervising physician I was immediately available for consultation/collaboration.     

## 2015-10-01 NOTE — Telephone Encounter (Signed)
Forms received, scanned and faxed  °

## 2015-10-11 ENCOUNTER — Encounter: Payer: Self-pay | Admitting: Family Medicine

## 2015-10-11 ENCOUNTER — Ambulatory Visit (INDEPENDENT_AMBULATORY_CARE_PROVIDER_SITE_OTHER): Payer: 59 | Admitting: Family Medicine

## 2015-10-11 VITALS — BP 129/78 | HR 85 | Temp 98.1°F | Resp 16 | Ht 69.0 in | Wt 167.4 lb

## 2015-10-11 DIAGNOSIS — R509 Fever, unspecified: Secondary | ICD-10-CM | POA: Diagnosis not present

## 2015-10-11 DIAGNOSIS — R51 Headache: Secondary | ICD-10-CM | POA: Diagnosis not present

## 2015-10-11 DIAGNOSIS — J029 Acute pharyngitis, unspecified: Secondary | ICD-10-CM | POA: Diagnosis not present

## 2015-10-11 LAB — POCT INFLUENZA A/B
Influenza A, POC: NEGATIVE
Influenza B, POC: NEGATIVE

## 2015-10-11 LAB — POCT RAPID STREP A (OFFICE): Rapid Strep A Screen: NEGATIVE

## 2015-10-11 MED ORDER — AZITHROMYCIN 250 MG PO TABS: ORAL_TABLET | ORAL | Status: DC

## 2015-10-11 MED FILL — AZITHROMYCIN 250 MG TABLET: 250 | 5 days supply | Qty: 10 | Fill #0

## 2015-10-11 NOTE — Patient Instructions (Signed)

## 2015-10-11 NOTE — Progress Notes (Signed)
Subjective:    Patient ID: Victoria Barr, female    DOB: 1970-11-14, 45 y.o.   MRN: [id number]  10/11/2015  Sore Throat; Headache; and Generalized Body Aches   HPI This 45 y.o. female presents for evaluation of sore throat, fever, headache.  Onset all week; sore throat all week; today feels horrible.  +feverish/   Review of Systems  Constitutional: Negative for fever, chills, diaphoresis and fatigue.  Eyes: Negative for visual disturbance.  Respiratory: Negative for cough and shortness of breath.   Cardiovascular: Negative for chest pain, palpitations and leg swelling.  Gastrointestinal: Negative for nausea, vomiting, abdominal pain, diarrhea and constipation.  Endocrine: Negative for cold intolerance, heat intolerance, polydipsia, polyphagia and polyuria.  Neurological: Negative for dizziness, tremors, seizures, syncope, facial asymmetry, speech difficulty, weakness, light-headedness, numbness and headaches.    Past Medical History  Diagnosis Date  . Palpitations   . MVP (mitral valve prolapse)     echo in 2012 did not show MVP but did have mild elongation of the AMVL  . Allergy   . Anxiety   . Arthritis   . Depression   . Hyperlipidemia   . Tachycardia   . Intestinal malrotation   . Hypertension 05/2013  . History of abnormal cervical Pap smear 2006, 2015    2015 Neg with + HR HPV, 09/2014 ASCUS with + HR HPV, colpo with LEEP 11/2014-LGSIL (CIN-I)   Past Surgical History  Procedure Laterality Date  . Intestinal malrotation repair  1999  . Tubal ligation    . Appendectomy  1999  . Colposcopy  2006, 2015    2006 LGSIL, normal since; 2015 LGSIL w/LEEP  . Excision morton's neuroma Bilateral 2002  . Nasal sinus surgery    . Cervical biopsy  w/ loop electrode excision  11/28/14    LGSIL with negative margins   Allergies  Allergen Reactions  . Imitrex [Sumatriptan] Hives and Anaphylaxis  . Iohexol Hives    Patient must pre medicated if having an exam with contrast per  Dr. Maxwell// rls  . Bupropion Hives  . Prozac [Fluoxetine Hcl] Hives  . Penicillins Hives    Has patient had a PCN reaction causing immediate rash, facial/tongue/throat swelling, SOB or lightheadedness with hypotension: Yes Has patient had a PCN reaction causing severe rash involving mucus membranes or skin necrosis: Yes Has patient had a PCN reaction that required hospitalization Yes Has patient had a PCN reaction occurring within the last 10 years: No If all of the above answers are "NO", then may proceed with Cephalosporin use.   . Sulfa Antibiotics Hives  . Zoloft [Sertraline Hcl] Hives   Current Outpatient Prescriptions  Medication Sig Dispense Refill  . diclofenac (VOLTAREN) 75 MG EC tablet Take 75 mg by mouth daily as needed (back pain).    . Multiple Vitamin (MULTIVITAMIN) tablet Take 1 tablet by mouth daily.    . nitrofurantoin, macrocrystal-monohydrate, (MACROBID) 100 MG capsule Take 100 mg by mouth daily as needed (uti symptoms). Reported on 10/11/2015  11  . OVER THE COUNTER MEDICATION daily.    . Probiotic CAPS Take 1 capsule by mouth daily.    . Cholecalciferol (VITAMIN D) 2000 UNITS CAPS Take 2,000 Units by mouth daily. Reported on 10/11/2015    . meclizine (ANTIVERT) 25 MG tablet Take 1 tablet (25 mg total) by mouth 3 (three) times daily as needed for dizziness or nausea. (Patient not taking: Reported on 09/11/2015) 20 tablet 0   No current facility-administered medications for this visit.  Social History   Social History  . Marital Status: Single    Spouse Name: N/A  . Number of Children: N/A  . Years of Education: N/A   Occupational History  . Not on file.   Social History Main Topics  . Smoking status: Current Some Day Smoker  . Smokeless tobacco: Never Used     Comment: quit 1 year ago, sep 2013  . Alcohol Use: No     Comment: occasionally  . Drug Use: No  . Sexual Activity: Not Currently    Birth Control/ Protection: Surgical     Comment: BTL    Other Topics Concern  . Not on file   Social History Narrative   Family History  Problem Relation Age of Onset  . Heart disease Mother   . Hypertension Mother   . Aneurysm Father 36    brain   . Hypertension Sister   . Heart disease Maternal Grandmother   . Cancer Paternal Grandmother   . Cancer Paternal Grandfather        Objective:    BP 129/78 mmHg  Pulse 85  Temp(Src) 98.1 F (36.7 C) (Oral)  Resp 16  Ht  (1.753 m)  Wt 167 lb 6.4 oz (75.932 kg)  BMI 24.71 kg/m2  SpO2 98%  LMP 10/01/2015 Physical Exam  Constitutional: She is oriented to person, place, and time. She appears well-developed and well-nourished. No distress.  HENT:  Head: Normocephalic and atraumatic.  Right Ear: External ear normal.  Left Ear: External ear normal.  Nose: Nose normal.  Mouth/Throat: Oropharynx is clear and moist.  Eyes: Conjunctivae and EOM are normal. Pupils are equal, round, and reactive to light.  Neck: Normal range of motion. Neck supple. Carotid bruit is not present. No thyromegaly present.  Cardiovascular: Normal rate, regular rhythm, normal heart sounds and intact distal pulses.  Exam reveals no gallop and no friction rub.   No murmur heard. Pulmonary/Chest: Effort normal and breath sounds normal. She has no wheezes. She has no rales.  Abdominal: Soft. Bowel sounds are normal. She exhibits no distension and no mass. There is no tenderness. There is no rebound and no guarding.  Lymphadenopathy:    She has no cervical adenopathy.  Neurological: She is alert and oriented to person, place, and time. No cranial nerve deficit.  Skin: Skin is warm and dry. No rash noted. She is not diaphoretic. No erythema. No pallor.  Psychiatric: She has a normal mood and affect. Her behavior is normal.   Results for orders placed or performed in visit on 09/24/15  Urine culture  Result Value Ref Range   Colony Count 7,000 COLONIES/ML    Organism ID, Bacteria Insignificant Growth   POCT  urinalysis dipstick  Result Value Ref Range   Color, UA yellow yellow   Clarity, UA clear clear   Glucose, UA negative negative   Bilirubin, UA negative negative   Ketones, POC UA negative negative   Spec Grav, UA 1.020    Blood, UA trace-intact (A) negative   pH, UA 7.0    Protein Ur, POC negative negative   Urobilinogen, UA 0.2    Nitrite, UA Negative Negative   Leukocytes, UA Negative Negative  POCT Microscopic Urinalysis ([facility])  Result Value Ref Range   WBC,UR,HPF,POC None None WBC/hpf   RBC,UR,HPF,POC None None RBC/hpf   Bacteria Few (A) None, Too numerous to count   Mucus Absent Absent   Epithelial Cells, UR Per Microscopy Few (A) None, Too numerous to  count cells/hpf  POCT CBC  Result Value Ref Range   WBC 8.3 4.6 - 10.2 K/uL   Lymph, poc 1.6 0.6 - 3.4   POC LYMPH PERCENT 19.6 10 - 50 %L   MID (cbc) 0.4 0 - 0.9   POC MID % 5.1 0 - 12 %M   POC Granulocyte 6.2 2 - 6.9   Granulocyte percent 75.3 37 - 80 %G   RBC 5.12 4.04 - 5.48 M/uL   Hemoglobin 15.5 12.2 - 16.2 g/dL   HCT, POC 16.1 09.6 - 47.9 %   MCV 87.2 80 - 97 fL   MCH, POC 30.4 27 - 31.2 pg   MCHC 34.8 31.8 - 35.4 g/dL   RDW, POC 04.5 %   Platelet Count, POC 280 142 - 424 K/uL   MPV 7.3 0 - 99.8 fL       Assessment & Plan:  No diagnosis found.  No orders of the defined types were placed in this encounter.   Meds ordered this encounter  Medications  . OVER THE COUNTER MEDICATION    Sig: daily.  . Multiple Vitamin (MULTIVITAMIN) tablet    Sig: Take 1 tablet by mouth daily.    No Follow-up on file.    Savio Albrecht Paulita Fujita, M.D. [facility] [address] [place], [place]  [address] [phone] phone [phone] fax

## 2015-10-15 LAB — CULTURE, GROUP A STREP: Organism ID, Bacteria: NORMAL

## 2015-10-16 ENCOUNTER — Encounter: Payer: Self-pay | Admitting: Physician Assistant

## 2015-10-16 DIAGNOSIS — R300 Dysuria: Secondary | ICD-10-CM

## 2015-10-21 MED FILL — TRIMETHOPRIM 100 MG TABLET: 100 | 7 days supply | Qty: 14 | Fill #0

## 2015-10-22 ENCOUNTER — Encounter: Payer: Self-pay | Admitting: Physician Assistant

## 2015-10-22 ENCOUNTER — Ambulatory Visit (INDEPENDENT_AMBULATORY_CARE_PROVIDER_SITE_OTHER): Payer: 59 | Admitting: Physician Assistant

## 2015-10-22 VITALS — BP 120/70 | HR 84 | Temp 98.3°F | Resp 16 | Ht 69.0 in | Wt 170.2 lb

## 2015-10-22 DIAGNOSIS — F329 Major depressive disorder, single episode, unspecified: Secondary | ICD-10-CM | POA: Diagnosis not present

## 2015-10-22 DIAGNOSIS — F32A Depression, unspecified: Secondary | ICD-10-CM

## 2015-10-22 NOTE — Progress Notes (Signed)
Subjective:    Patient ID: Victoria Barr, female    DOB: 22-Feb-1971, 45 y.o.   MRN: [id number]  Chief Complaint  Patient presents with  . feeling tired  . Depression    depression scale during triage, score 11   HPI Patient presents today for feeling tired for the last 2 or 3 months. Says over the last 11 days, since last visit in office, it has worsened and she is not able to control her mood any longer. Feels like she is on edge all the time. Reports history of depression, that comes and goes with time. She presents today because she wants to get help through a referral to mental health in order to receive help. Uninterested in taking medication at this current time.   She is unsure of what makes her tired. Reports difficulty falling asleep, staying asleep and difficulty getting out of bed in the morning. 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 Normal day includes waking @ 630am, taking son to school, working 8am-6om, picking up her son from school, arriving home @ 7pm, doing hw with her son and then trying to sleep by 10.    Dissatisfied at work, but feels like it is not because she doesn't like her job but because she is not happy.   Since the change in mood began she notes she has been getting migraines, UTI's, and increasing back issues. Her Migraines she reports as without aura/preeceding symptoms, unilateral throbbing headaches with associated photophobia, phonophobia, and nausea. Relieved with OTC migraine NSAIDS. Denies vomiting.   While she has had suicidal thoughts she has no intention or plan to harm herself.   Depression screen [facility] 2/9 10/22/2015 10/11/2015 09/24/2015 09/11/2015 07/23/2015  Decreased Interest 2 0 0 0 0  Down, Depressed, Hopeless 2 0 0 0 3  PHQ - 2 Score 4 0 0 0 3  Altered sleeping 2 - - - 1  Tired, decreased energy 2 - - - 2  Change in appetite 0 - - - 0  Feeling bad or failure about yourself  2 - - - 1  Trouble concentrating 0 - - - 0  Moving slowly or  fidgety/restless 0 - - - 0  Suicidal thoughts 1 - - - 0  PHQ-9 Score 11 - - - 7  Difficult doing work/chores Very difficult - - - Not difficult at all    Review of Systems  Constitutional: Positive for appetite change and fatigue. Negative for chills and unexpected weight change.  Respiratory: Negative for cough, chest tightness and shortness of breath.   Cardiovascular: Negative for chest pain, palpitations and leg swelling.  Gastrointestinal: Positive for diarrhea (established ibs) and constipation (established ibs).  Endocrine: Positive for cold intolerance and heat intolerance. Negative for polydipsia, polyphagia and polyuria.       Admits to brittle nails, thinning hair, dry skin  Genitourinary: Negative.   Musculoskeletal: Positive for back pain (hx).  Neurological: Positive for headaches (hx). Negative for dizziness, weakness, light-headedness and numbness.  Hematological: Does not bruise/bleed easily.  Psychiatric/Behavioral: Positive for sleep disturbance, decreased concentration (says she is very add) and agitation. Negative for behavioral problems. The patient is nervous/anxious.    Allergies  Allergen Reactions  . Imitrex [Sumatriptan] Hives and Anaphylaxis  . Iohexol Hives    Patient must pre medicated if having an exam with contrast per Dr. Maxwell// rls  . Bupropion Hives  . Prozac [Fluoxetine Hcl] Hives  . Penicillins Hives    Has patient  had a PCN reaction causing immediate rash, facial/tongue/throat swelling, SOB or lightheadedness with hypotension: Yes Has patient had a PCN reaction causing severe rash involving mucus membranes or skin necrosis: Yes Has patient had a PCN reaction that required hospitalization Yes Has patient had a PCN reaction occurring within the last 10 years: No If all of the above answers are "NO", then may proceed with Cephalosporin use.   . Sulfa Antibiotics Hives  . Zoloft [Sertraline Hcl] Hives   Prior to Admission medications     Medication Sig Start Date End Date Taking? Authorizing Provider  diclofenac (VOLTAREN) 75 MG EC tablet Take 75 mg by mouth daily as needed (back pain).   Yes Historical Provider, MD  Multiple Vitamin (MULTIVITAMIN) tablet Take 1 tablet by mouth daily.   Yes Historical Provider, MD  nitrofurantoin, macrocrystal-monohydrate, (MACROBID) 100 MG capsule Take 100 mg by mouth daily as needed (uti symptoms). Reported on 10/11/2015 06/04/15  Yes Historical Provider, MD  Probiotic CAPS Take 1 capsule by mouth daily.   Yes Historical Provider, MD  Cholecalciferol (VITAMIN D) 2000 UNITS CAPS Take 2,000 Units by mouth daily. Reported on 10/22/2015    Historical Provider, MD  EPIPEN 2-PAK 0.3 MG/0.3ML SOAJ injection  08/21/15   Historical Provider, MD  meclizine (ANTIVERT) 25 MG tablet Take 1 tablet (25 mg total) by mouth 3 (three) times daily as needed for dizziness or nausea. Patient not taking: Reported on 09/11/2015 07/18/15   Fayrene Helper, PA-C  OVER THE COUNTER MEDICATION daily. Reported on 10/22/2015    Historical Provider, MD   Patient Active Problem List   Diagnosis Date Noted  . Vitamin D deficiency 05/23/2015  . Depression 05/23/2015  . Acute infection of nasal sinus 04/25/2015  . Bilateral leg pain 11/13/2014  . Smoker 05/02/2014  . LGSIL (low grade squamous intraepithelial dysplasia) 05/02/2014  . VIN I (vulvar intraepithelial neoplasia I) 05/02/2014  . Chest pain 11/30/2013  . Abnormal EKG 11/30/2013  . Palpitations   . MVP (mitral valve prolapse)   . Dysuria 06/09/2013       Objective:   Physical Exam  Constitutional: Vital signs are normal. She appears well-developed and well-nourished. No distress.  BP 120/70 mmHg  Pulse 84  Temp(Src) 98.3 F (36.8 C) (Oral)  Resp 16  Ht  (1.753 m)  Wt 170 lb 3.2 oz (77.202 kg)  BMI 25.12 kg/m2  SpO2 98%  LMP 10/01/2015   HENT:  Head: Normocephalic and atraumatic.  Right Ear: External ear normal.  Left Ear: External ear normal.   Nose: Nose normal.  Mouth/Throat: Oropharynx is clear and moist. No oropharyngeal exudate.  Eyes: Conjunctivae and lids are normal. Pupils are equal, round, and reactive to light.  Neck: Trachea normal. Neck supple. No tracheal deviation present. No thyromegaly present.  Cardiovascular: Normal rate, regular rhythm, S1 normal, S2 normal and normal heart sounds.  Exam reveals no gallop and no friction rub.   No murmur heard. Pulses:      Radial pulses are 2+ on the right side, and 2+ on the left side.  Pulmonary/Chest: Effort normal and breath sounds normal.  Lymphadenopathy:       Head (right side): No submental, no submandibular, no tonsillar, no preauricular, no posterior auricular and no occipital adenopathy present.       Head (left side): No submental, no submandibular, no tonsillar, no preauricular, no posterior auricular and no occipital adenopathy present.    She has no cervical adenopathy.  Neurological: She is alert.  Skin:  Skin is warm and dry.  Psychiatric: She has a normal mood and affect. Her speech is normal.  Vitals reviewed.      Assessment & Plan:  1. Depression -Information on referrals to [facility] health provided.   -An After Visit Summary was printed and given to the patient.  No Follow-up on file.

## 2015-10-22 NOTE — Progress Notes (Signed)
Patient ID: Victoria Barr, female    DOB: 1971-02-21, 45 y.o.   MRN: [id number]  PCP: Olene Floss  Subjective:   Chief Complaint  Patient presents with  . feeling tired  . Depression    depression scale during triage, score 11    HPI Presents for evaluation of feeling tired for the last 2 or 3 months.   Says over the last 11 days, since last visit in office, it has worsened and she is not able to control her mood any longer. Feels like she is on edge all the time. Reports history of depression, that comes and goes with time. She has tried multiple medications previously, and due to drug allergies and sensitivities (she notes that if she's been on a medication before, stops it and then tries it again, she develops itchy rash).  She presents today because she wants to get help through a referral to mental health in order to receive help. Uninterested in taking medication at this current time.   She is unsure of what makes her tired. Reports difficulty falling asleep, staying asleep and difficulty getting out of bed in the morning. 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 Normal day includes waking @ 630am, taking son to school, working 8am-6om, picking up her son from school, arriving home @ 7pm, doing hw with her son and then trying to sleep by 10.   Dissatisfied at work, but feels like it is not because she doesn't like her job but because she is not happy.   Since the change in mood began she notes she has been getting migraines, UTI's, and increasing back issues. Her Migraines she reports as without aura/preeceding symptoms, unilateral throbbing headaches with associated photophobia, phonophobia, and nausea. Relieved with OTC migraine NSAIDS. Denies vomiting.   She has been evaluated for thyroid disorder as recently as 01/2015.  While she has had suicidal thoughts she has no intention or plan to harm herself.   Depression screen [facility] 2/9 10/22/2015 10/11/2015 09/24/2015 09/11/2015  07/23/2015  Decreased Interest 2 0 0 0 0  Down, Depressed, Hopeless 2 0 0 0 3  PHQ - 2 Score 4 0 0 0 3  Altered sleeping 2 - - - 1  Tired, decreased energy 2 - - - 2  Change in appetite 0 - - - 0  Feeling bad or failure about yourself  2 - - - 1  Trouble concentrating 0 - - - 0  Moving slowly or fidgety/restless 0 - - - 0  Suicidal thoughts 1 - - - 0  PHQ-9 Score 11 - - - 7  Difficult doing work/chores Very difficult - - - Not difficult at all        Review of Systems Constitutional: Positive for appetite change and fatigue. Negative for chills and unexpected weight change.  Respiratory: Negative for cough, chest tightness and shortness of breath.  Cardiovascular: Negative for chest pain, palpitations and leg swelling.  Gastrointestinal: Positive for diarrhea (established ibs) and constipation (established ibs).  Endocrine: Positive for cold intolerance and heat intolerance. Negative for polydipsia, polyphagia and polyuria.   Admits to brittle nails, thinning hair, dry skin  Genitourinary: Negative.  Musculoskeletal: Positive for back pain (hx).  Neurological: Positive for headaches (hx). Negative for dizziness, weakness, light-headedness and numbness.  Hematological: Does not bruise/bleed easily.  Psychiatric/Behavioral: Positive for sleep disturbance, decreased concentration (says she is very add) and agitation. Negative for behavioral problems. The patient is nervous/anxious.     Patient  Active Problem List   Diagnosis Date Noted  . Vitamin D deficiency 05/23/2015  . Depression 05/23/2015  . Acute infection of nasal sinus 04/25/2015  . Bilateral leg pain 11/13/2014  . Smoker 05/02/2014  . LGSIL (low grade squamous intraepithelial dysplasia) 05/02/2014  . VIN I (vulvar intraepithelial neoplasia I) 05/02/2014  . Chest pain 11/30/2013  . Abnormal EKG 11/30/2013  . Palpitations   . MVP (mitral valve prolapse)   . Dysuria 06/09/2013     Prior to Admission  medications   Medication Sig Start Date End Date Taking? Authorizing Provider  diclofenac (VOLTAREN) 75 MG EC tablet Take 75 mg by mouth daily as needed (back pain).   Yes Historical Provider, MD  Multiple Vitamin (MULTIVITAMIN) tablet Take 1 tablet by mouth daily.   Yes Historical Provider, MD  nitrofurantoin, macrocrystal-monohydrate, (MACROBID) 100 MG capsule Take 100 mg by mouth daily as needed (uti symptoms). Reported on 10/11/2015 06/04/15  Yes Historical Provider, MD  Probiotic CAPS Take 1 capsule by mouth daily.   Yes Historical Provider, MD  Cholecalciferol (VITAMIN D) 2000 UNITS CAPS Take 2,000 Units by mouth daily. Reported on 10/22/2015    Historical Provider, MD  EPIPEN 2-PAK 0.3 MG/0.3ML SOAJ injection  08/21/15   Historical Provider, MD  meclizine (ANTIVERT) 25 MG tablet Take 1 tablet (25 mg total) by mouth 3 (three) times daily as needed for dizziness or nausea. Patient not taking: Reported on 09/11/2015 07/18/15   Fayrene Helper, PA-C  OVER THE COUNTER MEDICATION daily. Reported on 10/22/2015    Historical Provider, MD     Allergies  Allergen Reactions  . Imitrex [Sumatriptan] Hives and Anaphylaxis  . Iohexol Hives    Patient must pre medicated if having an exam with contrast per Dr. Maxwell// rls  . Bupropion Hives  . Prozac [Fluoxetine Hcl] Hives  . Penicillins Hives    Has patient had a PCN reaction causing immediate rash, facial/tongue/throat swelling, SOB or lightheadedness with hypotension: Yes Has patient had a PCN reaction causing severe rash involving mucus membranes or skin necrosis: Yes Has patient had a PCN reaction that required hospitalization Yes Has patient had a PCN reaction occurring within the last 10 years: No If all of the above answers are "NO", then may proceed with Cephalosporin use.   . Sulfa Antibiotics Hives  . Zoloft [Sertraline Hcl] Hives       Objective:  Physical Exam  Constitutional: She is oriented to person, place, and time. She appears  well-developed and well-nourished. No distress.  BP 120/70 mmHg  Pulse 84  Temp(Src) 98.3 F (36.8 C) (Oral)  Resp 16  Ht  (1.753 m)  Wt 170 lb 3.2 oz (77.202 kg)  BMI 25.12 kg/m2  SpO2 98%  LMP 10/01/2015   Eyes: Conjunctivae are normal. No scleral icterus.  Neck: No thyromegaly present.  Cardiovascular: Normal rate, regular rhythm, normal heart sounds and intact distal pulses.   Pulmonary/Chest: Effort normal and breath sounds normal.  Lymphadenopathy:    She has no cervical adenopathy.  Neurological: She is alert and oriented to person, place, and time.  Skin: Skin is warm and dry.  Psychiatric: Her speech is normal and behavior is normal. Judgment and thought content normal. Her mood appears not anxious. Her affect is not angry, not blunt, not labile and not inappropriate. Cognition and memory are normal. She exhibits a depressed mood.           Assessment & Plan:   1. Depression Names and contact numbers for  several psychologists provided. Encouraged her to contact and schedule. If she needs a referral, I'm happy to provide it, but in my experience, self-referral is required.   Fernande Bras, PA-C Physician Assistant-Certified [facility]

## 2015-10-22 NOTE — Patient Instructions (Signed)
[facility]: [phone] [facility] [address])  [facility]: [phone]  [facility]: [phone] [facility] [address] [address])

## 2015-10-29 DIAGNOSIS — M542 Cervicalgia: Secondary | ICD-10-CM | POA: Diagnosis not present

## 2015-10-29 DIAGNOSIS — M5136 Other intervertebral disc degeneration, lumbar region: Secondary | ICD-10-CM | POA: Diagnosis not present

## 2015-11-05 ENCOUNTER — Ambulatory Visit (INDEPENDENT_AMBULATORY_CARE_PROVIDER_SITE_OTHER): Payer: 59 | Admitting: Licensed Clinical Social Worker

## 2015-11-05 DIAGNOSIS — F331 Major depressive disorder, recurrent, moderate: Secondary | ICD-10-CM

## 2015-11-19 ENCOUNTER — Ambulatory Visit (INDEPENDENT_AMBULATORY_CARE_PROVIDER_SITE_OTHER): Payer: 59 | Admitting: Licensed Clinical Social Worker

## 2015-11-19 DIAGNOSIS — F331 Major depressive disorder, recurrent, moderate: Secondary | ICD-10-CM

## 2015-11-23 ENCOUNTER — Ambulatory Visit (INDEPENDENT_AMBULATORY_CARE_PROVIDER_SITE_OTHER): Payer: 59 | Admitting: Family Medicine

## 2015-11-23 VITALS — BP 124/80 | HR 85 | Temp 98.1°F | Resp 18 | Ht 69.0 in | Wt 168.4 lb

## 2015-11-23 DIAGNOSIS — J029 Acute pharyngitis, unspecified: Secondary | ICD-10-CM | POA: Diagnosis not present

## 2015-11-23 LAB — POCT INFLUENZA A/B
Influenza A, POC: NEGATIVE
Influenza B, POC: NEGATIVE

## 2015-11-23 LAB — POCT RAPID STREP A (OFFICE): Rapid Strep A Screen: NEGATIVE

## 2015-11-23 MED ORDER — AZITHROMYCIN 250 MG PO TABS: ORAL_TABLET | ORAL | Status: DC

## 2015-11-23 NOTE — Patient Instructions (Addendum)
Because you received labwork today, you will receive an invoice from [facility]. Please contact [facility] at [phone] with questions or concerns regarding your invoice. Our billing staff will not be able to assist you with those questions.  You will be contacted with the lab results as soon as they are available. The fastest way to get your results is to activate your My Chart account. Instructions are located on the last page of this paperwork. If you have not heard from us regarding the results in 2 weeks, please contact this office.   Pharyngitis Pharyngitis is redness, pain, and swelling (inflammation) of your pharynx.  CAUSES  Pharyngitis is usually caused by infection. Most of the time, these infections are from viruses (viral) and are part of a cold. However, sometimes pharyngitis is caused by bacteria (bacterial). Pharyngitis can also be caused by allergies. Viral pharyngitis may be spread from person to person by coughing, sneezing, and personal items or utensils (cups, forks, spoons, toothbrushes). Bacterial pharyngitis may be spread from person to person by more intimate contact, such as kissing.  SIGNS AND SYMPTOMS  Symptoms of pharyngitis include:   Sore throat.   Tiredness (fatigue).   Low-grade fever.   Headache.  Joint pain and muscle aches.  Skin rashes.  Swollen lymph nodes.  Plaque-like film on throat or tonsils (often seen with bacterial pharyngitis). DIAGNOSIS  Your health care provider will ask you questions about your illness and your symptoms. Your medical history, along with a physical exam, is often all that is needed to diagnose pharyngitis. Sometimes, a rapid strep test is done. Other lab tests may also be done, depending on the suspected cause.  TREATMENT  Viral pharyngitis will usually get better in 3-4 days without the use of medicine. Bacterial pharyngitis is treated with medicines that kill germs (antibiotics).  HOME  CARE INSTRUCTIONS   Drink enough water and fluids to keep your urine clear or pale yellow.   Only take over-the-counter or prescription medicines as directed by your health care provider:   If you are prescribed antibiotics, make sure you finish them even if you start to feel better.   Do not take aspirin.   Get lots of rest.   Gargle with 8 oz of salt water ( tsp of salt per 1 qt of water) as often as every 1-2 hours to soothe your throat.   Throat lozenges (if you are not at risk for choking) or sprays may be used to soothe your throat. SEEK MEDICAL CARE IF:   You have large, tender lumps in your neck.  You have a rash.  You cough up green, yellow-brown, or bloody spit. SEEK IMMEDIATE MEDICAL CARE IF:   Your neck becomes stiff.  You drool or are unable to swallow liquids.  You vomit or are unable to keep medicines or liquids down.  You have severe pain that does not go away with the use of recommended medicines.  You have trouble breathing (not caused by a stuffy nose). MAKE SURE YOU:   Understand these instructions.  Will watch your condition.  Will get help right away if you are not doing well or get worse.   This information is not intended to replace advice given to you by your health care provider. Make sure you discuss any questions you have with your health care provider.   Document Released: 09/07/2005 Document Revised: 06/28/2013 Document Reviewed: 05/15/2013 Elsevier Interactive Patient Education 2016 Elsevier Inc.  

## 2015-11-23 NOTE — Progress Notes (Signed)
Subjective:  By signing my name below, I, [name], attest that this documentation has been prepared under the direction and in the presence of Norberto SorensonEva Suzane Vanderweide, MD.  Electronically Signed: Andrew Auaven Small, ED Scribe. 11/23/2015. 4:37 PM.   Patient ID: Victoria Barr, female    DOB: 1971-08-24, 45 y.o.   MRN: [id number]  HPI Chief Complaint  Patient presents with  . Sore Throat    started wednesday  . Fever   HPI Comments: Victoria Barr is a 45 y.o. female who presents to the [facility] complaining of a sore throat that began 3 days ago with associated low grade fever of 99.9, dizziness and diarrhea for 1 day yesterday. She has taken aleve and advil without relief. She states her son had strep 1 month ago. Pt had her flu shot this year. 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    Past Medical History  Diagnosis Date  . Palpitations   . MVP (mitral valve prolapse)     echo in 2012 did not show MVP but did have mild elongation of the AMVL  . Allergy   . Anxiety   . Arthritis   . Depression   . Hyperlipidemia   . Tachycardia   . Intestinal malrotation   . Hypertension 05/2013  . History of abnormal cervical Pap smear 2006, 2015    2015 Neg with + HR HPV, 09/2014 ASCUS with + HR HPV, colpo with LEEP 11/2014-LGSIL (CIN-I)  . Pyelonephritis 03/2015    evaluated by urology (Dr. Vernie Ammonsttelin)  . Recurrent UTI    Past Surgical History  Procedure Laterality Date  . Intestinal malrotation repair  1999  . Tubal ligation    . Appendectomy  1999  . Colposcopy  2006, 2015    2006 LGSIL, normal since; 2015 LGSIL w/LEEP  . Excision morton's neuroma Bilateral 2002  . Nasal sinus surgery    . Cervical biopsy  w/ loop electrode excision  11/28/14    LGSIL with negative margins  . Hemorrhoid surgery     Prior to Admission medications   Medication Sig Start Date End Date Taking? Authorizing Provider  diclofenac (VOLTAREN) 75 MG EC tablet Take 75 mg by mouth daily as needed (back  pain).   Yes Historical Provider, MD  EPIPEN 2-PAK 0.3 MG/0.3ML SOAJ injection  08/21/15  Yes Historical Provider, MD  Multiple Vitamin (MULTIVITAMIN) tablet Take 1 tablet by mouth daily.   Yes Historical Provider, MD  OVER THE COUNTER MEDICATION daily. Reported on 10/22/2015   Yes Historical Provider, MD  Probiotic CAPS Take 1 capsule by mouth daily.   Yes Historical Provider, MD  Cholecalciferol (VITAMIN D) 2000 UNITS CAPS Take 2,000 Units by mouth daily. Reported on 11/23/2015    Historical Provider, MD  meclizine (ANTIVERT) 25 MG tablet Take 1 tablet (25 mg total) by mouth 3 (three) times daily as needed for dizziness or nausea. Patient not taking: Reported on 09/11/2015 07/18/15   Fayrene HelperBowie Tran, PA-C  nitrofurantoin, macrocrystal-monohydrate, (MACROBID) 100 MG capsule Take 100 mg by mouth daily as needed (uti symptoms). Reported on 11/23/2015 06/04/15   Historical Provider, MD   Review of Systems  Constitutional: Positive for fever, chills, diaphoresis, activity change, appetite change and fatigue.  HENT: Positive for sore throat. Negative for congestion, rhinorrhea and sinus pressure.   Gastrointestinal: Positive for diarrhea. Negative for nausea, vomiting and abdominal pain.  Genitourinary: Negative for urgency and decreased urine volume.  Skin: Negative for rash.  Neurological: Positive for dizziness, light-headedness and headaches.  Hematological: Positive for adenopathy.  Psychiatric/Behavioral: Positive for sleep disturbance.       Objective:   Physical Exam  Constitutional: She is oriented to person, place, and time. She appears well-developed and well-nourished. No distress.  HENT:  Head: Normocephalic and atraumatic.  Right Ear: A middle ear effusion is present.  Left Ear: Tympanic membrane is scarred. A middle ear effusion is present.  Nose: Nose normal.  Mouth/Throat: Oropharyngeal exudate ( beat red) present.  A little petechiae on palate.   Eyes: Conjunctivae and EOM are  normal.  Neck: Neck supple.  Cardiovascular: Normal rate, regular rhythm and normal heart sounds.   No murmur heard. Pulmonary/Chest: Effort normal and breath sounds normal. No respiratory distress. She has no wheezes. She has no rales. She exhibits no tenderness.  Musculoskeletal: Normal range of motion.  Lymphadenopathy:       Head (right side): Tonsillar adenopathy present.       Head (left side): Tonsillar adenopathy present.    She has cervical adenopathy ( anterior).       Right: No supraclavicular adenopathy present.       Left: No supraclavicular adenopathy present.  Neurological: She is alert and oriented to person, place, and time.  Skin: Skin is warm and dry.  Psychiatric: She has a normal mood and affect. Her behavior is normal.  Nursing note and vitals reviewed.  Filed Vitals:   11/23/15 1616  BP: 124/80  Pulse: 85  Temp: 98.1 F (36.7 C)  TempSrc: Oral  Resp: 18  Height:  (1.753 m)  Weight: 168 lb 6 oz (76.374 kg)  SpO2: 98%     Results for orders placed or performed in visit on 11/23/15  POCT rapid strep A  Result Value Ref Range   Rapid Strep A Screen Negative Negative  POCT Influenza A/B  Result Value Ref Range   Influenza A, POC Negative Negative   Influenza B, POC Negative Negative   Assessment & Plan:   1. Acute pharyngitis, unspecified etiology    Pt is allergic to sulfa and penicillin with anaphylaxis Gave snap rx in case sxs worsen.   Orders Placed This Encounter  Procedures  . Culture, Group A Strep    Order Specific Question:  Source    Answer:  Throat  . POCT rapid strep A  . POCT Influenza A/B    Meds ordered this encounter  Medications  . azithromycin (ZITHROMAX) 250 MG tablet    Sig: Take 2 tabs PO x 1 dose, then 1 tab PO QD x 4 days    Dispense:  6 tablet    Refill:  0   I personally performed the services described in this documentation, which was scribed in my presence. The recorded information has been reviewed and  considered, and addended by me as needed.  Norberto Sorenson, MD MPH

## 2015-11-26 LAB — CULTURE, GROUP A STREP: Organism ID, Bacteria: NORMAL

## 2015-11-27 MED FILL — OXYCODONE/APAP 5-325: 5-325 | 4 days supply | Qty: 40 | Fill #0

## 2015-12-02 ENCOUNTER — Ambulatory Visit (INDEPENDENT_AMBULATORY_CARE_PROVIDER_SITE_OTHER): Payer: 59 | Admitting: Licensed Clinical Social Worker

## 2015-12-02 DIAGNOSIS — F331 Major depressive disorder, recurrent, moderate: Secondary | ICD-10-CM

## 2015-12-03 ENCOUNTER — Encounter: Payer: Self-pay | Admitting: Physician Assistant

## 2015-12-04 DIAGNOSIS — M5136 Other intervertebral disc degeneration, lumbar region: Secondary | ICD-10-CM | POA: Diagnosis not present

## 2015-12-09 DIAGNOSIS — M5136 Other intervertebral disc degeneration, lumbar region: Secondary | ICD-10-CM | POA: Diagnosis not present

## 2015-12-23 DIAGNOSIS — M5136 Other intervertebral disc degeneration, lumbar region: Secondary | ICD-10-CM | POA: Diagnosis not present

## 2016-01-07 ENCOUNTER — Ambulatory Visit (INDEPENDENT_AMBULATORY_CARE_PROVIDER_SITE_OTHER): Payer: 59 | Admitting: Emergency Medicine

## 2016-01-07 VITALS — BP 112/78 | HR 81 | Temp 97.9°F | Resp 16 | Ht 69.0 in | Wt 172.0 lb

## 2016-01-07 DIAGNOSIS — J029 Acute pharyngitis, unspecified: Secondary | ICD-10-CM

## 2016-01-07 DIAGNOSIS — R05 Cough: Secondary | ICD-10-CM | POA: Diagnosis not present

## 2016-01-07 DIAGNOSIS — J209 Acute bronchitis, unspecified: Secondary | ICD-10-CM

## 2016-01-07 DIAGNOSIS — J208 Acute bronchitis due to other specified organisms: Secondary | ICD-10-CM | POA: Diagnosis not present

## 2016-01-07 DIAGNOSIS — R059 Cough, unspecified: Secondary | ICD-10-CM

## 2016-01-07 LAB — POCT CBC
Granulocyte percent: 71.7 %G (ref 37–80)
HCT, POC: 41.9 % (ref 37.7–47.9)
Hemoglobin: 14.6 g/dL (ref 12.2–16.2)
Lymph, poc: 1.6 (ref 0.6–3.4)
MCH, POC: 30.9 pg (ref 27–31.2)
MCHC: 34.8 g/dL (ref 31.8–35.4)
MCV: 88.8 fL (ref 80–97)
MID (cbc): 0.6 (ref 0–0.9)
MPV: 7.7 fL (ref 0–99.8)
POC Granulocyte: 5.5 (ref 2–6.9)
POC LYMPH PERCENT: 21.1 %L (ref 10–50)
POC MID %: 7.2 %M (ref 0–12)
Platelet Count, POC: 257 10*3/uL (ref 142–424)
RBC: 4.72 M/uL (ref 4.04–5.48)
RDW, POC: 13.2 %
WBC: 7.7 10*3/uL (ref 4.6–10.2)

## 2016-01-07 LAB — POCT INFLUENZA A/B
Influenza A, POC: NEGATIVE
Influenza B, POC: NEGATIVE

## 2016-01-07 LAB — POCT RAPID STREP A (OFFICE): Rapid Strep A Screen: NEGATIVE

## 2016-01-07 MED ORDER — AZITHROMYCIN 250 MG PO TABS: ORAL_TABLET | ORAL | Status: DC

## 2016-01-07 MED ORDER — BENZONATATE 100 MG PO CAPS: 100.0000 mg | ORAL_CAPSULE | Freq: Three times a day (TID) | ORAL | Status: DC | PRN

## 2016-01-07 MED FILL — BENZONATATE 100 MG CAPSULE: 100 | 6 days supply | Qty: 40 | Fill #0

## 2016-01-07 MED FILL — AZITHROMYCIN 250 MG TABLET: 250 | 5 days supply | Qty: 6 | Fill #0

## 2016-01-07 NOTE — Patient Instructions (Addendum)
   IF you received an x-ray today, you will receive an invoice from [facility]. Please contact [facility] at [phone] with questions or concerns regarding your invoice.   IF you received labwork today, you will receive an invoice from [facility]. Please contact [facility] at [phone] with questions or concerns regarding your invoice.   Our billing staff will not be able to assist you with questions regarding bills from these companies.  You will be contacted with the lab results as soon as they are available. The fastest way to get your results is to activate your My Chart account. Instructions are located on the last page of this paperwork. If you have not heard from us regarding the results in 2 weeks, please contact this office.    Acute Bronchitis Bronchitis is inflammation of the airways that extend from the windpipe into the lungs (bronchi). The inflammation often causes mucus to develop. This leads to a cough, which is the most common symptom of bronchitis.  In acute bronchitis, the condition usually develops suddenly and goes away over time, usually in a couple weeks. Smoking, allergies, and asthma can make bronchitis worse. Repeated episodes of bronchitis may cause further lung problems.  CAUSES Acute bronchitis is most often caused by the same virus that causes a cold. The virus can spread from person to person (contagious) through coughing, sneezing, and touching contaminated objects. SIGNS AND SYMPTOMS   Cough.   Fever.   Coughing up mucus.   Body aches.   Chest congestion.   Chills.   Shortness of breath.   Sore throat.  DIAGNOSIS  Acute bronchitis is usually diagnosed through a physical exam. Your health care provider will also ask you questions about your medical history. Tests, such as chest X-rays, are sometimes done to rule out other conditions.  TREATMENT  Acute bronchitis usually goes away in a  couple weeks. Oftentimes, no medical treatment is necessary. Medicines are sometimes given for relief of fever or cough. Antibiotic medicines are usually not needed but may be prescribed in certain situations. In some cases, an inhaler may be recommended to help reduce shortness of breath and control the cough. A cool mist vaporizer may also be used to help thin bronchial secretions and make it easier to clear the chest.  HOME CARE INSTRUCTIONS  Get plenty of rest.   Drink enough fluids to keep your urine clear or pale yellow (unless you have a medical condition that requires fluid restriction). Increasing fluids may help thin your respiratory secretions (sputum) and reduce chest congestion, and it will prevent dehydration.   Take medicines only as directed by your health care provider.  If you were prescribed an antibiotic medicine, finish it all even if you start to feel better.  Avoid smoking and secondhand smoke. Exposure to cigarette smoke or irritating chemicals will make bronchitis worse. If you are a smoker, consider using nicotine gum or skin patches to help control withdrawal symptoms. Quitting smoking will help your lungs heal faster.   Reduce the chances of another bout of acute bronchitis by washing your hands frequently, avoiding people with cold symptoms, and trying not to touch your hands to your mouth, nose, or eyes.   Keep all follow-up visits as directed by your health care provider.  SEEK MEDICAL CARE IF: Your symptoms do not improve after 1 week of treatment.  SEEK IMMEDIATE MEDICAL CARE IF:  You develop an increased fever or chills.   You have chest pain.     You have severe shortness of breath.  You have bloody sputum.   You develop dehydration.  You faint or repeatedly feel like you are going to pass out.  You develop repeated vomiting.  You develop a severe headache. MAKE SURE YOU:   Understand these instructions.  Will watch your  condition.  Will get help right away if you are not doing well or get worse.   This information is not intended to replace advice given to you by your health care provider. Make sure you discuss any questions you have with your health care provider.   Document Released: 10/15/2004 Document Revised: 09/28/2014 Document Reviewed: 02/28/2013 Elsevier Interactive Patient Education 2016 Elsevier Inc.  

## 2016-01-07 NOTE — Progress Notes (Addendum)
Patient ID: Victoria Barr, female   DOB: [birth year], 45 y.o.   MRN: [id number]     By signing my name below, I, Littie Deeds, attest that this documentation has been prepared under the direction and in the presence of Lesle Chris, MD.  Electronically Signed: Littie Deeds, Medical Scribe. 01/07/2016. 8:41 AM.   Chief Complaint:  Chief Complaint  Patient presents with  . Cough    since yesterday  . Shortness of Breath    HPI: Victoria Barr is a 45 y.o. female who reports to [facility] today complaining of gradual onset, productive cough of green sputum that started this morning. Patient states she felt SOB this morning during her coughing spell. She also reports having associated sore throat, mild wheezing, headache, facial swelling, and generalized myalgias. She notes that she has not felt well overall for the past 4 days and has felt feverish; her temperature was elevated 3 days ago at 99.2 F. Patient has not been sleeping well due to her symptoms. 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 She received the flu shot last October. Patient does report smoking about 1 pack per week. She had quit smoking previously, but recently began smoking again.  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  Past Medical History  Diagnosis Date  . Palpitations   . MVP (mitral valve prolapse)     echo in 2012 did not show MVP but did have mild elongation of the AMVL  . Allergy   . Anxiety   . Arthritis   . Depression   . Hyperlipidemia   . Tachycardia   . Intestinal malrotation   . Hypertension 05/2013  . History of abnormal cervical Pap smear 2006, 2015    2015 Neg with + HR HPV, 09/2014 ASCUS with + HR HPV, colpo with LEEP 11/2014-LGSIL (CIN-I)  . Pyelonephritis 03/2015    evaluated by urology (Dr. Vernie Ammons)  . Recurrent UTI    Past Surgical History  Procedure Laterality Date  . Intestinal malrotation repair  1999  .  Tubal ligation    . Appendectomy  1999  . Colposcopy  2006, 2015    2006 LGSIL, normal since; 2015 LGSIL w/LEEP  . Excision morton's neuroma Bilateral 2002  . Nasal sinus surgery    . Cervical biopsy  w/ loop electrode excision  11/28/14    LGSIL with negative margins  . Hemorrhoid surgery     Social History   Social History  . Marital Status: Single    Spouse Name: N/A  . Number of Children: N/A  . Years of Education: N/A   Social History Main Topics  . Smoking status: Current Some Day Smoker  . Smokeless tobacco: Never Used     Comment: quit 1 year ago, sep 2013  . Alcohol Use: No     Comment: occasionally  . Drug Use: No  . Sexual Activity: Not Currently    Birth Control/ Protection: Surgical     Comment: BTL   Other Topics Concern  . None   Social History Narrative   Family History  Problem Relation Age of Onset  . Heart disease Mother   . Hypertension Mother   . Aneurysm Father 36    brain   . Hypertension Sister   . Heart disease Maternal Grandmother   . Cancer Paternal Grandmother   . Cancer Paternal Grandfather    Allergies  Allergen Reactions  . Imitrex [Sumatriptan] Hives and Anaphylaxis  . Iohexol Hives    Patient must pre medicated if having an exam with contrast per Dr. Maxwell// rls  . Bupropion Hives  . Prozac [Fluoxetine Hcl] Hives  . Penicillins Hives    Has patient had a PCN reaction causing immediate rash, facial/tongue/throat swelling, SOB or lightheadedness with hypotension: Yes Has patient had a PCN reaction causing severe rash involving mucus membranes or skin necrosis: Yes Has patient had a PCN reaction that required hospitalization Yes Has patient had a PCN reaction occurring within the last 10 years: No If all of the above answers are "NO", then may proceed with Cephalosporin use.   . Sulfa Antibiotics Hives  . Zoloft [Sertraline Hcl] Hives   Prior to Admission medications   Medication Sig Start Date End Date Taking? Authorizing  Provider  EPIPEN 2-PAK 0.3 MG/0.3ML SOAJ injection  08/21/15  Yes Historical Provider, MD  Probiotic CAPS Take 1 capsule by mouth daily.   Yes Historical Provider, MD  Cholecalciferol (VITAMIN D) 2000 UNITS CAPS Take 2,000 Units by mouth daily. Reported on 01/07/2016    Historical Provider, MD  diclofenac (VOLTAREN) 75 MG EC tablet Take 75 mg by mouth daily as needed (back pain). Reported on 01/07/2016    Historical Provider, MD  Multiple Vitamin (MULTIVITAMIN) tablet Take 1 tablet by mouth daily. Reported on 01/07/2016    Historical Provider, MD  nitrofurantoin, macrocrystal-monohydrate, (MACROBID) 100 MG capsule Take 100 mg by mouth daily as needed (uti symptoms). Reported on 01/07/2016 06/04/15   Historical Provider, MD  OVER THE COUNTER MEDICATION daily. Reported on 01/07/2016    Historical Provider, MD     ROS: The patient denies unintentional weight loss, chest pain, palpitations, wheezing, dyspnea on exertion, nausea, vomiting, abdominal pain, dysuria, hematuria, melena, numbness, weakness, or tingling.  All other systems have been reviewed and were otherwise negative with the exception of those mentioned in the HPI and as above.    PHYSICAL EXAM: Filed Vitals:   01/07/16 0836  BP: 112/78  Pulse: 81  Temp: 97.9 F (36.6 C)  Resp: 16   Body mass index is 25.39 kg/(m^2).   General: Alert, no acute distress HEENT:  Normocephalic, atraumatic, oropharynx patent. Nasal congestion. Throat normal. Eye: [facility] Cardiovascular:  Regular rate and rhythm, no rubs murmurs or gallops.  No Carotid bruits, radial pulse intact. No pedal edema.  Respiratory: Clear to auscultation bilaterally.  No wheezes, rales, or rhonchi.  No cyanosis, no use of accessory musculature Abdominal: No organomegaly, abdomen is soft and non-tender, positive bowel sounds.  No masses. Musculoskeletal: Gait intact. No edema, tenderness Skin: No rashes. Neurologic: Facial musculature symmetric. Psychiatric: Patient  acts appropriately throughout our interaction. Lymphatic: No cervical or submandibular lymphadenopathy    LABS: Results for orders placed or performed in visit on 01/07/16  POCT rapid strep A  Result Value Ref Range   Rapid Strep A Screen Negative Negative  POCT Influenza A/B  Result Value Ref Range   Influenza A, POC Negative Negative   Influenza B, POC Negative Negative   Results for orders placed or performed in visit on 01/07/16  POCT rapid strep A  Result Value Ref Range   Rapid Strep A Screen Negative Negative  POCT Influenza A/B  Result Value Ref Range   Influenza A, POC Negative Negative   Influenza B, POC Negative Negative  POCT CBC  Result Value Ref Range   WBC 7.7 4.6 - 10.2 K/uL   Lymph,  poc 1.6 0.6 - 3.4   POC LYMPH PERCENT 21.1 10 - 50 %L   MID (cbc) 0.6 0 - 0.9   POC MID % 7.2 0 - 12 %M   POC Granulocyte 5.5 2 - 6.9   Granulocyte percent 71.7 37 - 80 %G   RBC 4.72 4.04 - 5.48 M/uL   Hemoglobin 14.6 12.2 - 16.2 g/dL   HCT, POC 09.8 11.9 - 47.9 %   MCV 88.8 80 - 97 fL   MCH, POC 30.9 27 - 31.2 pg   MCHC 34.8 31.8 - 35.4 g/dL   RDW, POC 14.7 %   Platelet Count, POC 257 142 - 424 K/uL   MPV 7.7 0 - 99.8 fL    EKG/XRAY:   Primary read interpreted by Dr. Cleta Alberts at [facility].   ASSESSMENT/PLAN: We'll treat as a bronchitis. She is a smoker. She has multiple drug allergies. Will treat with Z-Pak and Tessalon Perles. She was given a note until Monday but can return to work sooner if improving.I personally performed the services described in this documentation, which was scribed in my presence. The recorded information has been reviewed and is accurate.    Gross sideeffects, risk and benefits, and alternatives of medications d/w patient. Patient is aware that all medications have potential sideeffects and we are unable to predict every sideeffect or drug-drug interaction that may occur.  Lesle Chris MD 01/07/2016 8:41 AM

## 2016-01-09 ENCOUNTER — Telehealth: Payer: Self-pay

## 2016-01-09 NOTE — Telephone Encounter (Signed)
Patient needs FMLA forms completed by Dr Cleta Albertsaub-- I have filled them out according to the OV notes from 01/07/16 I have highlighted the area that needs to be signed, I will place these in your box on 01/09/16. If you could complete and place them back in the FMLA/Disability box at the 102 checkout desk within 5-7 business days. Thank you!

## 2016-01-10 NOTE — Telephone Encounter (Signed)
Paperwork scanned and faxed to [employer] on 01/10/16

## 2016-01-14 ENCOUNTER — Encounter: Payer: Self-pay | Admitting: Physician Assistant

## 2016-01-14 DIAGNOSIS — R61 Generalized hyperhidrosis: Secondary | ICD-10-CM

## 2016-01-14 DIAGNOSIS — L659 Nonscarring hair loss, unspecified: Secondary | ICD-10-CM

## 2016-01-20 ENCOUNTER — Telehealth: Payer: 59 | Admitting: Family

## 2016-01-20 DIAGNOSIS — B9689 Other specified bacterial agents as the cause of diseases classified elsewhere: Secondary | ICD-10-CM

## 2016-01-20 DIAGNOSIS — A499 Bacterial infection, unspecified: Secondary | ICD-10-CM

## 2016-01-20 DIAGNOSIS — J329 Chronic sinusitis, unspecified: Secondary | ICD-10-CM | POA: Diagnosis not present

## 2016-01-20 MED ORDER — DOXYCYCLINE HYCLATE 100 MG PO TABS: 100.0000 mg | ORAL_TABLET | Freq: Two times a day (BID) | ORAL | Status: DC

## 2016-01-20 NOTE — Progress Notes (Signed)

## 2016-01-21 ENCOUNTER — Other Ambulatory Visit (INDEPENDENT_AMBULATORY_CARE_PROVIDER_SITE_OTHER): Payer: 59 | Admitting: *Deleted

## 2016-01-21 DIAGNOSIS — R61 Generalized hyperhidrosis: Secondary | ICD-10-CM

## 2016-01-21 DIAGNOSIS — L659 Nonscarring hair loss, unspecified: Secondary | ICD-10-CM | POA: Diagnosis not present

## 2016-01-21 LAB — COMPREHENSIVE METABOLIC PANEL
ALT: 10 U/L (ref 6–29)
AST: 15 U/L (ref 10–30)
Albumin: 4.1 g/dL (ref 3.6–5.1)
Alkaline Phosphatase: 88 U/L (ref 33–115)
BUN: 13 mg/dL (ref 7–25)
CO2: 25 mmol/L (ref 20–31)
Calcium: 8.8 mg/dL (ref 8.6–10.2)
Chloride: 104 mmol/L (ref 98–110)
Creat: 0.83 mg/dL (ref 0.50–1.10)
Glucose, Bld: 84 mg/dL (ref 65–99)
Potassium: 4.3 mmol/L (ref 3.5–5.3)
Sodium: 139 mmol/L (ref 135–146)
Total Bilirubin: 0.2 mg/dL (ref 0.2–1.2)
Total Protein: 6.5 g/dL (ref 6.1–8.1)

## 2016-01-21 LAB — CBC WITH DIFFERENTIAL/PLATELET
Basophils Absolute: 76 cells/uL (ref 0–200)
Basophils Relative: 1 %
Eosinophils Absolute: 304 cells/uL (ref 15–500)
Eosinophils Relative: 4 %
HCT: 39.6 % (ref 35.0–45.0)
Hemoglobin: 13.4 g/dL (ref 11.7–15.5)
Lymphocytes Relative: 31 %
Lymphs Abs: 2356 cells/uL (ref 850–3900)
MCH: 29.9 pg (ref 27.0–33.0)
MCHC: 33.8 g/dL (ref 32.0–36.0)
MCV: 88.4 fL (ref 80.0–100.0)
MPV: 10.5 fL (ref 7.5–12.5)
Monocytes Absolute: 532 cells/uL (ref 200–950)
Monocytes Relative: 7 %
Neutro Abs: 4332 cells/uL (ref 1500–7800)
Neutrophils Relative %: 57 %
Platelets: 268 10*3/uL (ref 140–400)
RBC: 4.48 MIL/uL (ref 3.80–5.10)
RDW: 13.1 % (ref 11.0–15.0)
WBC: 7.6 10*3/uL (ref 3.8–10.8)

## 2016-01-21 LAB — TSH: TSH: 0.51 mIU/L

## 2016-02-28 DIAGNOSIS — F411 Generalized anxiety disorder: Secondary | ICD-10-CM | POA: Diagnosis not present

## 2016-02-28 DIAGNOSIS — Z13220 Encounter for screening for lipoid disorders: Secondary | ICD-10-CM | POA: Diagnosis not present

## 2016-02-28 DIAGNOSIS — Z131 Encounter for screening for diabetes mellitus: Secondary | ICD-10-CM | POA: Diagnosis not present

## 2016-02-28 DIAGNOSIS — J3089 Other allergic rhinitis: Secondary | ICD-10-CM | POA: Diagnosis not present

## 2016-02-28 DIAGNOSIS — R05 Cough: Secondary | ICD-10-CM | POA: Diagnosis not present

## 2016-02-28 DIAGNOSIS — Z720 Tobacco use: Secondary | ICD-10-CM | POA: Diagnosis not present

## 2016-02-28 DIAGNOSIS — F320 Major depressive disorder, single episode, mild: Secondary | ICD-10-CM | POA: Diagnosis not present

## 2016-02-28 MED FILL — buPROPion HCL 100 MG TABS: 100 | 30 days supply | Qty: 60 | Fill #0

## 2016-03-05 ENCOUNTER — Ambulatory Visit (HOSPITAL_COMMUNITY): Payer: 59 | Attending: Cardiovascular Disease

## 2016-03-05 ENCOUNTER — Ambulatory Visit (INDEPENDENT_AMBULATORY_CARE_PROVIDER_SITE_OTHER): Payer: 59 | Admitting: Cardiology

## 2016-03-05 ENCOUNTER — Other Ambulatory Visit: Payer: Self-pay

## 2016-03-05 ENCOUNTER — Encounter: Payer: Self-pay | Admitting: Cardiology

## 2016-03-05 ENCOUNTER — Ambulatory Visit
Admission: RE | Admit: 2016-03-05 | Discharge: 2016-03-05 | Disposition: A | Discharge status: Home / Self Care | Payer: 59 | Source: Ambulatory Visit | Attending: Cardiology | Admitting: Cardiology

## 2016-03-05 VITALS — BP 142/84 | HR 85 | Ht 68.0 in | Wt 176.8 lb

## 2016-03-05 DIAGNOSIS — Z720 Tobacco use: Secondary | ICD-10-CM | POA: Diagnosis not present

## 2016-03-05 DIAGNOSIS — R079 Chest pain, unspecified: Secondary | ICD-10-CM

## 2016-03-05 DIAGNOSIS — J40 Bronchitis, not specified as acute or chronic: Secondary | ICD-10-CM | POA: Diagnosis not present

## 2016-03-05 DIAGNOSIS — R05 Cough: Secondary | ICD-10-CM | POA: Diagnosis not present

## 2016-03-05 DIAGNOSIS — F17200 Nicotine dependence, unspecified, uncomplicated: Secondary | ICD-10-CM

## 2016-03-05 DIAGNOSIS — I10 Essential (primary) hypertension: Secondary | ICD-10-CM | POA: Insufficient documentation

## 2016-03-05 DIAGNOSIS — E785 Hyperlipidemia, unspecified: Secondary | ICD-10-CM | POA: Diagnosis not present

## 2016-03-05 DIAGNOSIS — R253 Fasciculation: Secondary | ICD-10-CM | POA: Diagnosis not present

## 2016-03-05 DIAGNOSIS — I341 Nonrheumatic mitral (valve) prolapse: Secondary | ICD-10-CM | POA: Diagnosis not present

## 2016-03-05 LAB — BASIC METABOLIC PANEL
BUN: 14 mg/dL (ref 7–25)
CO2: 27 mmol/L (ref 20–31)
Calcium: 10 mg/dL (ref 8.6–10.2)
Chloride: 104 mmol/L (ref 98–110)
Creat: 0.74 mg/dL (ref 0.50–1.10)
Glucose, Bld: 90 mg/dL (ref 65–99)
Potassium: 4.1 mmol/L (ref 3.5–5.3)
Sodium: 137 mmol/L (ref 135–146)

## 2016-03-05 LAB — ECHOCARDIOGRAM COMPLETE
FS: 52 % — AB (ref 28–44)
Height: 68 in
IVS/LV PW RATIO, ED: 0.7
LA ID, A-P, ES: 27 mm
LA diam end sys: 27 mm
LA diam index: 1.39 cm/m2
LA vol A4C: 20 ml
LA vol index: 10.8 mL/m2
LA vol: 21 mL
LV PW d: 8.1 mm — AB (ref 0.6–1.1)
LV e' LATERAL: 11.4 cm/s
LVOT SV: 83 mL
LVOT VTI: 26.5 cm
LVOT area: 3.14 cm2
LVOT diameter: 20 mm
LVOT peak grad rest: 7 mmHg
LVOT peak vel: 135 cm/s
S' Lateral: 9.76 cm/s
TDI e' lateral: 11.4
TDI e' medial: 10.5
Weight: 2828.8 oz

## 2016-03-05 LAB — SEDIMENTATION RATE: Sed Rate: 8 mm/hr (ref 0–20)

## 2016-03-05 LAB — TSH: TSH: 0.72 mIU/L

## 2016-03-05 NOTE — Patient Instructions (Addendum)
Medication Instructions:  Your physician has recommended you make the following change in your medication:  1) START Pepcid OTC 20mg  daily 2) TAKE Ibuprofen 600mg  every 8 hours as needed   Labwork: Tsh, Crp, Sed Rate, Bmet today  Testing/Procedures: A chest x-ray takes a picture of the organs and structures inside the chest, including the heart, lungs, and blood vessels. This test can show several things, including, whether the heart is enlarges; whether fluid is building up in the lungs; and whether pacemaker / defibrillator leads are still in place.( [facility])  Your physician has requested that you have en exercise stress myoview. For further information please visit [email or web address]. Please follow instruction sheet, as given.  Your physician has requested that you have an echocardiogram. Echocardiography is a painless test that uses sound waves to create images of your heart. It provides your doctor with information about the size and shape of your heart and how well your heart's chambers and valves are working. This procedure takes approximately one hour. There are no restrictions for this procedure.   Follow-Up: Your physician recommends that you schedule a follow-up appointment in: 4 weeks with Dr.Turner   Any Other Special Instructions Will Be Listed Below (If Applicable).     If you need a refill on your cardiac medications before your next appointment, please call your pharmacy.

## 2016-03-05 NOTE — Progress Notes (Signed)
Cardiology Office Note   Date:  03/05/2016   ID:  Victoria Quantara L Seiter, DOB 1970/10/29, MRN [id number]  PCP:  Frederich ChickWEBB, CAROL D, MD  Cardiologist:  Dr. Mayford Knifeurner    Chief Complaint  Patient presents with  . Chest Pain      History of Present Illness: Victoria Barr is a 45 y.o. female who presents for hx of palpitations hx of atypical chest pain.  [place] pain that comes and goes with rest and exertion, does not last but a couple of minutes.  Did radiate toher jaw once.    Has not awakened her from sleep.  She has strong family hx of CAD. No associatd symptoms of nausea, or SOB.  She has been coughing and is now on ABX for bronchitis.  She continues to smoke though her PCP has give a script for Wellbutrin.   She has a history of MV prolapse diagnosed as a teenager but echo in 2012 with no MVP but flat coaptation of the AMVL with mild eccentric MR, palpitations with sinus tachycardia and atypical CP with echo a 11/2013 showing normal LVF with trivial MR.   Hx of normal nuc. Stress test. Also echo showed normal LVF EF 60-65% with increased stiffness of heart muscle, trivial leakiness of MV and no pericardial effusion in 2012.   She is concerned about her symptoms with family hx.   Past Medical History  Diagnosis Date  . Palpitations   . MVP (mitral valve prolapse)     echo in 2012 did not show MVP but did have mild elongation of the AMVL  . Allergy   . Anxiety   . Arthritis   . Depression   . Hyperlipidemia   . Tachycardia   . Intestinal malrotation   . Hypertension 05/2013  . History of abnormal cervical Pap smear 2006, 2015    2015 Neg with + HR HPV, 09/2014 ASCUS with + HR HPV, colpo with LEEP 11/2014-LGSIL (CIN-I)  . Pyelonephritis 03/2015    evaluated by urology (Dr. Vernie Ammonsttelin)  . Recurrent UTI     Past Surgical History  Procedure Laterality Date  . Intestinal malrotation repair  1999  . Tubal ligation    . Appendectomy  1999  . Colposcopy  2006,  2015    2006 LGSIL, normal since; 2015 LGSIL w/LEEP  . Excision morton's neuroma Bilateral 2002  . Nasal sinus surgery    . Cervical biopsy  w/ loop electrode excision  11/28/14    LGSIL with negative margins  . Hemorrhoid surgery       Current Outpatient Prescriptions  Medication Sig Dispense Refill  . benzonatate (TESSALON) 100 MG capsule Take 1-2 capsules (100-200 mg total) by mouth 3 (three) times daily as needed for cough. 40 capsule 0  . buPROPion (WELLBUTRIN) 100 MG tablet Take 1 tablet by mouth daily.  11  . Multiple Vitamin (MULTIVITAMIN) tablet Take 1 tablet by mouth daily. Reported on 01/07/2016    . nitrofurantoin, macrocrystal-monohydrate, (MACROBID) 100 MG capsule Take 100 mg by mouth daily as needed (uti symptoms). Reported on 01/07/2016  11  . Probiotic CAPS Take 1 capsule by mouth daily.     No current facility-administered medications for this visit.    Allergies:   Imitrex; Iohexol; Bupropion; Prozac; Penicillins; Sulfa antibiotics; and Zoloft    Social History:  The patient  reports that she has been smoking.  She has never used smokeless tobacco. She reports that she does not  drink alcohol or use illicit drugs.   Family History:  The patient's family history includes Aneurysm (age of onset: 1836) in her father; Cancer in her paternal grandfather and paternal grandmother; Heart disease in her maternal grandmother and mother; Hypertension in her mother and sister.    ROS:  General:no colds or fevers, no weight changes Skin:no rashes or ulcers HEENT:no blurred vision, no congestion CV:see HPI PUL:see HPI GI:no diarrhea constipation or melena, no indigestion GU:no hematuria, no dysuria MS:no joint pain, no claudication Neuro:no syncope, no lightheadedness Endo:no diabetes, no thyroid disease  Wt Readings from Last 3 Encounters:  03/05/16 176 lb 12.8 oz (80.196 kg)  01/07/16 172 lb (78.019 kg)  11/23/15 168 lb 6 oz (76.374 kg)     PHYSICAL EXAM: VS:  BP 142/84  mmHg  Pulse 85  Ht 5\' 8"  (1.727 m)  Wt 176 lb 12.8 oz (80.196 kg)  BMI 26.89 kg/m2  SpO2 98% , BMI Body mass index is 26.89 kg/(m^2). General:Pleasant affect, NAD Skin:Warm and dry, brisk capillary refill HEENT:normocephalic, sclera clear, mucus membranes moist Neck:supple, no JVD, no bruits  Heart:S1S2 RRR without murmur, gallup, rub or click Lungs:clear without rales, rhonchi, or wheezes WUJ:WJXBAbd:soft, non tender, + BS, do not palpate liver spleen or masses Ext:no lower ext edema, 2+ pedal pulses, 2+ radial pulses Neuro:alert and oriented, MAE, follows commands, + facial symmetry    EKG:  EKG is ordered today. The ekg ordered today demonstrates SR with Rt ward axis and no changes from previous.    Recent Labs: 01/21/2016: ALT 10; Hemoglobin 13.4; Platelets 268; TSH 0.51 03/05/2016: BUN 14; Creat 0.74; Potassium 4.1; Sodium 137    Lipid Panel    Component Value Date/Time   CHOL 221* 11/14/2014 0754   TRIG 99.0 11/14/2014 0754   HDL 76.40 11/14/2014 0754   CHOLHDL 3 11/14/2014 0754   VLDL 19.8 11/14/2014 0754   LDLCALC 125* 11/14/2014 0754       Other studies Reviewed: Additional studies/ records that were reviewed today include: see above.   ASSESSMENT AND PLAN:  1.  Chest pain may be muscular skeletal but with family hx will do exercise myoview. She should try ibuprofen 3 tabs to see if it helps prevent pain.  Also pepcid 20 mg to see if any indigestion causing. Will check echo perhaps pericarditis.   We will call results but follow up in 4 weeks with Dr. Mayford Knifeurner.  2. Tobacco use is plannign to stop.  3. Bronchitis but with chest pain will check CXR.       Current medicines are reviewed with the patient today.  The patient Has no concerns regarding medicines.  The following changes have been made:  See above Labs/ tests ordered today include:see above  Disposition:   FU:  see above  Signed, Nada BoozerLaura Alva Kuenzel, NP  03/05/2016 7:53 PM    [facility]  [facility] [address], [place], [place]  [address]/ [address] [place], [place] Phone: [phone]; Fax: [phone]  [phone]

## 2016-03-06 DIAGNOSIS — R300 Dysuria: Secondary | ICD-10-CM | POA: Diagnosis not present

## 2016-03-06 LAB — HIGH SENSITIVITY CRP: CRP, High Sensitivity: 3.8 mg/L — ABNORMAL HIGH

## 2016-03-09 ENCOUNTER — Telehealth (HOSPITAL_COMMUNITY): Payer: Self-pay | Admitting: *Deleted

## 2016-03-09 MED FILL — CIPROFLOXACIN HCL 500 MG TA: 500 | 7 days supply | Qty: 14 | Fill #0

## 2016-03-09 NOTE — Telephone Encounter (Signed)
Patient given detailed instructions per Myocardial Perfusion Study Information Sheet for the test on 03/11/16 at 0730. Patient notified to arrive 15 minutes early and that it is imperative to arrive on time for appointment to keep from having the test rescheduled.  If you need to cancel or reschedule your appointment, please call the office within 24 hours of your appointment. Failure to do so may result in a cancellation of your appointment, and a $50 no show fee. Patient verbalized understanding.Victoria Barr W     

## 2016-03-11 ENCOUNTER — Encounter (HOSPITAL_COMMUNITY): Payer: Self-pay

## 2016-03-25 ENCOUNTER — Telehealth (HOSPITAL_COMMUNITY): Payer: Self-pay | Admitting: *Deleted

## 2016-03-25 NOTE — Telephone Encounter (Signed)
Patient given detailed instructions per Myocardial Perfusion Study Information Sheet for the test on 03/30/16 Patient notified to arrive 15 minutes early and that it is imperative to arrive on time for appointment to keep from having the test rescheduled.  If you need to cancel or reschedule your appointment, please call the office within 24 hours of your appointment. Failure to do so may result in a cancellation of your appointment, and a $50 no show fee. Patient verbalized understanding. Charlena Haub J Ayinde Swim, RN  

## 2016-03-30 ENCOUNTER — Ambulatory Visit (HOSPITAL_COMMUNITY): Payer: 59 | Attending: Cardiology

## 2016-03-30 ENCOUNTER — Telehealth: Payer: Self-pay | Admitting: Cardiology

## 2016-03-30 ENCOUNTER — Ambulatory Visit: Payer: Self-pay | Admitting: Internal Medicine

## 2016-03-30 DIAGNOSIS — R9439 Abnormal result of other cardiovascular function study: Secondary | ICD-10-CM

## 2016-03-30 DIAGNOSIS — I10 Essential (primary) hypertension: Secondary | ICD-10-CM | POA: Insufficient documentation

## 2016-03-30 DIAGNOSIS — R079 Chest pain, unspecified: Secondary | ICD-10-CM | POA: Diagnosis not present

## 2016-03-30 DIAGNOSIS — R002 Palpitations: Secondary | ICD-10-CM | POA: Insufficient documentation

## 2016-03-30 LAB — MYOCARDIAL PERFUSION IMAGING
Estimated workload: 7 METS
Exercise duration (min): 6 min
Exercise duration (sec): 0 s
LV dias vol: 64 mL (ref 46–106)
LV sys vol: 16 mL
MPHR: 178 {beats}/min
Peak HR: 171 {beats}/min
Percent HR: 96 %
RATE: 0.4
Rest HR: 70 {beats}/min
SDS: 7
SRS: 6
SSS: 13
TID: 1.27

## 2016-03-30 MED ORDER — TECHNETIUM TC 99M TETROFOSMIN IV KIT
32.1000 | PACK | Freq: Once | INTRAVENOUS | Status: AC | PRN
  Administered 2016-03-30: 32.1 via INTRAVENOUS
  Filled 2016-03-30: qty 32

## 2016-03-30 MED ORDER — TECHNETIUM TC 99M TETROFOSMIN IV KIT
11.0000 | PACK | Freq: Once | INTRAVENOUS | Status: AC | PRN
  Administered 2016-03-30: 11 via INTRAVENOUS
  Filled 2016-03-30: qty 11

## 2016-03-31 ENCOUNTER — Other Ambulatory Visit: Payer: Self-pay | Admitting: Cardiology

## 2016-03-31 DIAGNOSIS — I208 Other forms of angina pectoris: Secondary | ICD-10-CM

## 2016-03-31 DIAGNOSIS — R9439 Abnormal result of other cardiovascular function study: Secondary | ICD-10-CM | POA: Diagnosis not present

## 2016-03-31 LAB — CBC WITH DIFFERENTIAL/PLATELET
Basophils Absolute: 0 cells/uL (ref 0–200)
Basophils Relative: 0 %
Eosinophils Absolute: 204 cells/uL (ref 15–500)
Eosinophils Relative: 3 %
HCT: 41.4 % (ref 35.0–45.0)
Hemoglobin: 13.9 g/dL (ref 11.7–15.5)
Lymphocytes Relative: 30 %
Lymphs Abs: 2040 cells/uL (ref 850–3900)
MCH: 29.8 pg (ref 27.0–33.0)
MCHC: 33.6 g/dL (ref 32.0–36.0)
MCV: 88.7 fL (ref 80.0–100.0)
MPV: 10.6 fL (ref 7.5–12.5)
Monocytes Absolute: 408 cells/uL (ref 200–950)
Monocytes Relative: 6 %
Neutro Abs: 4148 cells/uL (ref 1500–7800)
Neutrophils Relative %: 61 %
Platelets: 288 10*3/uL (ref 140–400)
RBC: 4.67 MIL/uL (ref 3.80–5.10)
RDW: 13.3 % (ref 11.0–15.0)
WBC: 6.8 10*3/uL (ref 3.8–10.8)

## 2016-03-31 LAB — PROTIME-INR
INR: 0.9
Prothrombin Time: 10.1 s (ref 9.0–11.5)

## 2016-03-31 LAB — BASIC METABOLIC PANEL
BUN: 14 mg/dL (ref 7–25)
CO2: 24 mmol/L (ref 20–31)
Calcium: 9.2 mg/dL (ref 8.6–10.2)
Chloride: 103 mmol/L (ref 98–110)
Creat: 0.81 mg/dL (ref 0.50–1.10)
Glucose, Bld: 88 mg/dL (ref 65–99)
Potassium: 4.6 mmol/L (ref 3.5–5.3)
Sodium: 140 mmol/L (ref 135–146)

## 2016-03-31 NOTE — Telephone Encounter (Signed)
Pt is having heart cath tomorrow. Orders need to be put in. Labs will be done today at [facility]

## 2016-03-31 NOTE — Progress Notes (Signed)
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  With strong family hx of CAD and her use of tobacco we did nuc study which had  Small defect, possible ischemia.    Study Highlights     Nuclear stress EF: 76%.  There was no ST segment deviation noted during stress.  Defect 1: There is a small defect of moderate severity present in the basal inferoseptal location.  Findings consistent with ischemia.  This is a low risk study   Reviewed with Dr. Mayford Knifeurner her primary cardiologist and plans for cardiac cath to further determine status.  Discussed with Delice Bisonara by phone and all questions answered.    The patient understands that risks included but are not limited to stroke (1 in 1000), death (1 in 1000), kidney failure [usually temporary] (1 in 500), bleeding (1 in 200), allergic reaction [possibly serious] (1 in 200).   Plan for Cath 04/01/16.

## 2016-03-31 NOTE — Telephone Encounter (Signed)
-----   Message from Leone BrandLaura R Ingold, NP sent at 03/30/2016 11:48 PM EDT ----- I notified Delice Bisonara of her results and recommendation for cath.  She has agreed and if we can schedule for Wed. Or Thurs with Dr. [place] or [place]Alhany only. Thanks.  I did review risks as well.  She will be working in [employer] [place] tomorrow.

## 2016-04-01 ENCOUNTER — Encounter (HOSPITAL_COMMUNITY)
Admission: RE | Disposition: A | Discharge status: Home / Self Care | Payer: Self-pay | Source: Ambulatory Visit | Attending: Cardiovascular Disease

## 2016-04-01 ENCOUNTER — Ambulatory Visit (HOSPITAL_COMMUNITY)
Admission: RE | Admit: 2016-04-01 | Discharge: 2016-04-01 | Disposition: A | Discharge status: Home / Self Care | Payer: 59 | Source: Ambulatory Visit | Attending: Cardiovascular Disease | Admitting: Cardiovascular Disease

## 2016-04-01 DIAGNOSIS — F419 Anxiety disorder, unspecified: Secondary | ICD-10-CM | POA: Diagnosis not present

## 2016-04-01 DIAGNOSIS — R0789 Other chest pain: Secondary | ICD-10-CM | POA: Diagnosis not present

## 2016-04-01 DIAGNOSIS — Z880 Allergy status to penicillin: Secondary | ICD-10-CM | POA: Insufficient documentation

## 2016-04-01 DIAGNOSIS — F329 Major depressive disorder, single episode, unspecified: Secondary | ICD-10-CM | POA: Diagnosis not present

## 2016-04-01 DIAGNOSIS — R072 Precordial pain: Secondary | ICD-10-CM

## 2016-04-01 DIAGNOSIS — J40 Bronchitis, not specified as acute or chronic: Secondary | ICD-10-CM | POA: Insufficient documentation

## 2016-04-01 DIAGNOSIS — M1990 Unspecified osteoarthritis, unspecified site: Secondary | ICD-10-CM | POA: Insufficient documentation

## 2016-04-01 DIAGNOSIS — R9439 Abnormal result of other cardiovascular function study: Secondary | ICD-10-CM | POA: Diagnosis not present

## 2016-04-01 DIAGNOSIS — Z882 Allergy status to sulfonamides status: Secondary | ICD-10-CM | POA: Diagnosis not present

## 2016-04-01 DIAGNOSIS — Q433 Congenital malformations of intestinal fixation: Secondary | ICD-10-CM | POA: Insufficient documentation

## 2016-04-01 DIAGNOSIS — R000 Tachycardia, unspecified: Secondary | ICD-10-CM | POA: Diagnosis not present

## 2016-04-01 DIAGNOSIS — Z8719 Personal history of other diseases of the digestive system: Secondary | ICD-10-CM | POA: Diagnosis not present

## 2016-04-01 DIAGNOSIS — Z87440 Personal history of urinary (tract) infections: Secondary | ICD-10-CM | POA: Insufficient documentation

## 2016-04-01 DIAGNOSIS — F17210 Nicotine dependence, cigarettes, uncomplicated: Secondary | ICD-10-CM | POA: Insufficient documentation

## 2016-04-01 DIAGNOSIS — Z8249 Family history of ischemic heart disease and other diseases of the circulatory system: Secondary | ICD-10-CM | POA: Diagnosis not present

## 2016-04-01 DIAGNOSIS — I10 Essential (primary) hypertension: Secondary | ICD-10-CM | POA: Insufficient documentation

## 2016-04-01 DIAGNOSIS — I2510 Atherosclerotic heart disease of native coronary artery without angina pectoris: Secondary | ICD-10-CM | POA: Diagnosis not present

## 2016-04-01 DIAGNOSIS — I341 Nonrheumatic mitral (valve) prolapse: Secondary | ICD-10-CM | POA: Diagnosis not present

## 2016-04-01 DIAGNOSIS — E785 Hyperlipidemia, unspecified: Secondary | ICD-10-CM | POA: Diagnosis not present

## 2016-04-01 HISTORY — PX: CARDIAC CATHETERIZATION: SHX172

## 2016-04-01 LAB — PREGNANCY, URINE: Preg Test, Ur: NEGATIVE

## 2016-04-01 SURGERY — LEFT HEART CATH AND CORONARY ANGIOGRAPHY

## 2016-04-01 MED ORDER — OXYCODONE-ACETAMINOPHEN 5-325 MG PO TABS
ORAL_TABLET | ORAL | Status: AC
  Filled 2016-04-01: qty 1

## 2016-04-01 MED ORDER — VERAPAMIL HCL 2.5 MG/ML IV SOLN
INTRAVENOUS | Status: AC
  Filled 2016-04-01: qty 2

## 2016-04-01 MED ORDER — FAMOTIDINE IN NACL 20-0.9 MG/50ML-% IV SOLN
20.0000 mg | INTRAVENOUS | Status: AC
  Administered 2016-04-01: 20 mg via INTRAVENOUS

## 2016-04-01 MED ORDER — FENTANYL CITRATE (PF) 100 MCG/2ML IJ SOLN
INTRAMUSCULAR | Status: AC
  Filled 2016-04-01: qty 2

## 2016-04-01 MED ORDER — SODIUM CHLORIDE 0.9 % WEIGHT BASED INFUSION
3.0000 mL/kg/h | INTRAVENOUS | Status: AC
  Administered 2016-04-01: 3 mL/kg/h via INTRAVENOUS

## 2016-04-01 MED ORDER — LIDOCAINE HCL (PF) 1 % IJ SOLN
INTRAMUSCULAR | Status: DC | PRN
  Administered 2016-04-01: 2 mL via INTRADERMAL

## 2016-04-01 MED ORDER — SODIUM CHLORIDE 0.9% FLUSH: 3.0000 mL | INTRAVENOUS | Status: DC | PRN

## 2016-04-01 MED ORDER — METHYLPREDNISOLONE SODIUM SUCC 125 MG IJ SOLR
INTRAMUSCULAR | Status: AC
  Administered 2016-04-01: 125 mg via INTRAVENOUS
  Filled 2016-04-01: qty 2

## 2016-04-01 MED ORDER — MIDAZOLAM HCL 2 MG/2ML IJ SOLN
INTRAMUSCULAR | Status: AC
  Filled 2016-04-01: qty 2

## 2016-04-01 MED ORDER — FAMOTIDINE IN NACL 20-0.9 MG/50ML-% IV SOLN
INTRAVENOUS | Status: AC
  Filled 2016-04-01: qty 50

## 2016-04-01 MED ORDER — DIPHENHYDRAMINE HCL 50 MG/ML IJ SOLN
INTRAMUSCULAR | Status: AC
  Administered 2016-04-01: 25 mg via INTRAVENOUS
  Filled 2016-04-01: qty 1

## 2016-04-01 MED ORDER — VERAPAMIL HCL 2.5 MG/ML IV SOLN
INTRAVENOUS | Status: DC | PRN
  Administered 2016-04-01: 10 mL via INTRA_ARTERIAL

## 2016-04-01 MED ORDER — DIPHENHYDRAMINE HCL 50 MG/ML IJ SOLN
25.0000 mg | Freq: Once | INTRAMUSCULAR | Status: AC
  Administered 2016-04-01: 25 mg via INTRAVENOUS

## 2016-04-01 MED ORDER — FENTANYL CITRATE (PF) 100 MCG/2ML IJ SOLN
INTRAMUSCULAR | Status: DC | PRN
  Administered 2016-04-01 (×3): 25 ug via INTRAVENOUS

## 2016-04-01 MED ORDER — FAMOTIDINE IN NACL 20-0.9 MG/50ML-% IV SOLN
INTRAVENOUS | Status: AC
  Administered 2016-04-01: 20 mg via INTRAVENOUS
  Filled 2016-04-01: qty 50

## 2016-04-01 MED ORDER — HEPARIN SODIUM (PORCINE) 1000 UNIT/ML IJ SOLN
INTRAMUSCULAR | Status: AC
  Filled 2016-04-01: qty 1

## 2016-04-01 MED ORDER — MIDAZOLAM HCL 2 MG/2ML IJ SOLN
INTRAMUSCULAR | Status: DC | PRN
Start: 2016-04-01 — End: 2016-04-01
  Administered 2016-04-01: 1 mg via INTRAVENOUS
  Administered 2016-04-01: 2 mg via INTRAVENOUS

## 2016-04-01 MED ORDER — DIPHENHYDRAMINE HCL 50 MG/ML IJ SOLN
INTRAMUSCULAR | Status: AC
  Filled 2016-04-01: qty 1

## 2016-04-01 MED ORDER — HEPARIN SODIUM (PORCINE) 1000 UNIT/ML IJ SOLN
INTRAMUSCULAR | Status: DC | PRN
  Administered 2016-04-01: 4000 [IU] via INTRAVENOUS

## 2016-04-01 MED ORDER — SODIUM CHLORIDE 0.9 % IV SOLN: 250.0000 mL | INTRAVENOUS | Status: DC | PRN

## 2016-04-01 MED ORDER — SODIUM CHLORIDE 0.9 % IV SOLN
INTRAVENOUS | Status: AC
  Administered 2016-04-01: 13:00:00 via INTRAVENOUS

## 2016-04-01 MED ORDER — SODIUM CHLORIDE 0.9% FLUSH: 3.0000 mL | Freq: Two times a day (BID) | INTRAVENOUS | Status: DC

## 2016-04-01 MED ORDER — IOPAMIDOL (ISOVUE-370) INJECTION 76%
INTRAVENOUS | Status: DC | PRN
  Administered 2016-04-01: 50 mL via INTRA_ARTERIAL

## 2016-04-01 MED ORDER — SODIUM CHLORIDE 0.9 % WEIGHT BASED INFUSION: 1.0000 mL/kg/h | INTRAVENOUS | Status: DC

## 2016-04-01 MED ORDER — OXYCODONE-ACETAMINOPHEN 5-325 MG PO TABS
2.0000 | ORAL_TABLET | ORAL | Status: DC | PRN
  Administered 2016-04-01: 2 via ORAL

## 2016-04-01 MED ORDER — ASPIRIN 81 MG PO CHEW
CHEWABLE_TABLET | ORAL | Status: AC
  Administered 2016-04-01: 81 mg via ORAL
  Filled 2016-04-01: qty 1

## 2016-04-01 MED ORDER — ASPIRIN 81 MG PO CHEW
81.0000 mg | CHEWABLE_TABLET | ORAL | Status: AC
  Administered 2016-04-01: 81 mg via ORAL

## 2016-04-01 MED ORDER — HEPARIN (PORCINE) IN NACL 2-0.9 UNIT/ML-% IJ SOLN
INTRAMUSCULAR | Status: DC | PRN
  Administered 2016-04-01: 1500 mL

## 2016-04-01 MED ORDER — HEPARIN (PORCINE) IN NACL 2-0.9 UNIT/ML-% IJ SOLN
INTRAMUSCULAR | Status: AC
  Filled 2016-04-01: qty 1000

## 2016-04-01 MED ORDER — OXYCODONE-ACETAMINOPHEN 5-325 MG PO TABS
ORAL_TABLET | ORAL | Status: DC
Start: 2016-04-01 — End: 2016-04-01
  Filled 2016-04-01: qty 1

## 2016-04-01 MED ORDER — IOPAMIDOL (ISOVUE-370) INJECTION 76%
INTRAVENOUS | Status: AC
  Filled 2016-04-01: qty 100

## 2016-04-01 MED ORDER — LIDOCAINE HCL (PF) 1 % IJ SOLN
INTRAMUSCULAR | Status: AC
  Filled 2016-04-01: qty 30

## 2016-04-01 MED ORDER — FAMOTIDINE IN NACL 20-0.9 MG/50ML-% IV SOLN
20.0000 mg | Freq: Once | INTRAVENOUS | Status: AC
Start: 2016-04-01 — End: 2016-04-01
  Administered 2016-04-01: 20 mg via INTRAVENOUS

## 2016-04-01 MED ORDER — HEPARIN (PORCINE) IN NACL 2-0.9 UNIT/ML-% IJ SOLN
INTRAMUSCULAR | Status: AC
  Filled 2016-04-01: qty 500

## 2016-04-01 MED ORDER — METHYLPREDNISOLONE SODIUM SUCC 125 MG IJ SOLR
125.0000 mg | INTRAMUSCULAR | Status: AC
  Administered 2016-04-01: 125 mg via INTRAVENOUS

## 2016-04-01 MED ORDER — DIPHENHYDRAMINE HCL 50 MG/ML IJ SOLN
25.0000 mg | INTRAMUSCULAR | Status: AC
  Administered 2016-04-01: 25 mg via INTRAVENOUS

## 2016-04-01 SURGICAL SUPPLY — 10 items
CATH INFINITI 5 FR JL3.5 (CATHETERS) ×2 IMPLANT
CATH INFINITI JR4 5F (CATHETERS) ×2 IMPLANT
DEVICE RAD COMP TR BAND LRG (VASCULAR PRODUCTS) ×2 IMPLANT
GLIDESHEATH SLEND SS 6F .021 (SHEATH) ×2 IMPLANT
KIT HEART LEFT (KITS) ×2 IMPLANT
PACK CARDIAC CATHETERIZATION (CUSTOM PROCEDURE TRAY) ×2 IMPLANT
SYR MEDRAD MARK V 150ML (SYRINGE) ×2 IMPLANT
TRANSDUCER W/STOPCOCK (MISCELLANEOUS) ×2 IMPLANT
TUBING CIL FLEX 10 FLL-RA (TUBING) ×2 IMPLANT
WIRE SAFE-T 1.5MM-J .035X260CM (WIRE) ×2 IMPLANT

## 2016-04-01 NOTE — Interval H&P Note (Signed)
History and Physical Interval Note:  04/01/2016 12:03 PM  Victoria Barr  has presented today for cardiac cath with the diagnosis of chest pain/abnormal stress test.  The various methods of treatment have been discussed with the patient and family. After consideration of risks, benefits and other options for treatment, the patient has consented to  Procedure(s): Left Heart Cath and Coronary Angiography (N/A) as a surgical intervention .  The patient's history has been reviewed, patient examined, no change in status, stable for surgery.  I have reviewed the patient's chart and labs.  Questions were answered to the patient's satisfaction.    Cath Lab Visit (complete for each Cath Lab visit)  Clinical Evaluation Leading to the Procedure:   ACS: No.  Non-ACS:    Anginal Classification: CCS II  Anti-ischemic medical therapy: No Therapy  Non-Invasive Test Results: Low-risk stress test findings: cardiac mortality <1%/year  Prior CABG: No previous CABG         Verne Carrowhristopher Alf Doyle

## 2016-04-01 NOTE — H&P (View-Only) (Signed)
Cardiology Office Note   Date:  03/05/2016   ID:  Victoria Barr, DOB 1970/10/29, MRN [id number]  PCP:  Frederich ChickWEBB, CAROL D, MD  Cardiologist:  Dr. Mayford Knifeurner    Chief Complaint  Patient presents with  . Chest Pain      History of Present Illness: Victoria Quantara L Accomando is a 45 y.o. female who presents for hx of palpitations hx of atypical chest pain.  [place] a pressure pain that comes and goes with rest and exertion, does not last but a couple of minutes.  Did radiate toher jaw once.    Has not awakened her from sleep.  She has strong family hx of CAD. No associatd symptoms of nausea, or SOB.  She has been coughing and is now on ABX for bronchitis.  She continues to smoke though her PCP has give a script for Wellbutrin.   She has a history of MV prolapse diagnosed as a teenager but echo in 2012 with no MVP but flat coaptation of the AMVL with mild eccentric MR, palpitations with sinus tachycardia and atypical CP with echo a 11/2013 showing normal LVF with trivial MR.   Hx of normal nuc. Stress test. Also echo showed normal LVF EF 60-65% with increased stiffness of heart muscle, trivial leakiness of MV and no pericardial effusion in 2012.   She is concerned about her symptoms with family hx.   Past Medical History  Diagnosis Date  . Palpitations   . MVP (mitral valve prolapse)     echo in 2012 did not show MVP but did have mild elongation of the AMVL  . Allergy   . Anxiety   . Arthritis   . Depression   . Hyperlipidemia   . Tachycardia   . Intestinal malrotation   . Hypertension 05/2013  . History of abnormal cervical Pap smear 2006, 2015    2015 Neg with + HR HPV, 09/2014 ASCUS with + HR HPV, colpo with LEEP 11/2014-LGSIL (CIN-I)  . Pyelonephritis 03/2015    evaluated by urology (Dr. Vernie Ammonsttelin)  . Recurrent UTI     Past Surgical History  Procedure Laterality Date  . Intestinal malrotation repair  1999  . Tubal ligation    . Appendectomy  1999  . Colposcopy  2006,  2015    2006 LGSIL, normal since; 2015 LGSIL w/LEEP  . Excision morton's neuroma Bilateral 2002  . Nasal sinus surgery    . Cervical biopsy  w/ loop electrode excision  11/28/14    LGSIL with negative margins  . Hemorrhoid surgery       Current Outpatient Prescriptions  Medication Sig Dispense Refill  . benzonatate (TESSALON) 100 MG capsule Take 1-2 capsules (100-200 mg total) by mouth 3 (three) times daily as needed for cough. 40 capsule 0  . buPROPion (WELLBUTRIN) 100 MG tablet Take 1 tablet by mouth daily.  11  . Multiple Vitamin (MULTIVITAMIN) tablet Take 1 tablet by mouth daily. Reported on 01/07/2016    . nitrofurantoin, macrocrystal-monohydrate, (MACROBID) 100 MG capsule Take 100 mg by mouth daily as needed (uti symptoms). Reported on 01/07/2016  11  . Probiotic CAPS Take 1 capsule by mouth daily.     No current facility-administered medications for this visit.    Allergies:   Imitrex; Iohexol; Bupropion; Prozac; Penicillins; Sulfa antibiotics; and Zoloft    Social History:  The patient  reports that she has been smoking.  She has never used smokeless tobacco. She reports that she does not  drink alcohol or use illicit drugs.   Family History:  The patient's family history includes Aneurysm (age of onset: 5) in her father; Cancer in her paternal grandfather and paternal grandmother; Heart disease in her maternal grandmother and mother; Hypertension in her mother and sister.    ROS:  General:no colds or fevers, no weight changes Skin:no rashes or ulcers HEENT:no blurred vision, no congestion CV:see HPI PUL:see HPI GI:no diarrhea constipation or melena, no indigestion GU:no hematuria, no dysuria MS:no joint pain, no claudication Neuro:no syncope, no lightheadedness Endo:no diabetes, no thyroid disease  Wt Readings from Last 3 Encounters:  03/05/16 176 lb 12.8 oz (80.196 kg)  01/07/16 172 lb (78.019 kg)  11/23/15 168 lb 6 oz (76.374 kg)     PHYSICAL EXAM: VS:  BP 142/84  mmHg  Pulse 85  Ht  (1.727 m)  Wt 176 lb 12.8 oz (80.196 kg)  BMI 26.89 kg/m2  SpO2 98% , BMI Body mass index is 26.89 kg/(m^2). General:Pleasant affect, NAD Skin:Warm and dry, brisk capillary refill HEENT:normocephalic, sclera clear, mucus membranes moist Neck:supple, no JVD, no bruits  Heart:S1S2 RRR without murmur, gallup, rub or click Lungs:clear without rales, rhonchi, or wheezes ZOX:WRUE, non tender, + BS, do not palpate liver spleen or masses Ext:no lower ext edema, 2+ pedal pulses, 2+ radial pulses Neuro:alert and oriented, MAE, follows commands, + facial symmetry    EKG:  EKG is ordered today. The ekg ordered today demonstrates SR with Rt ward axis and no changes from previous.    Recent Labs: 01/21/2016: ALT 10; Hemoglobin 13.4; Platelets 268; TSH 0.51 03/05/2016: BUN 14; Creat 0.74; Potassium 4.1; Sodium 137    Lipid Panel    Component Value Date/Time   CHOL 221* 11/14/2014 0754   TRIG 99.0 11/14/2014 0754   HDL 76.40 11/14/2014 0754   CHOLHDL 3 11/14/2014 0754   VLDL 19.8 11/14/2014 0754   LDLCALC 125* 11/14/2014 0754       Other studies Reviewed: Additional studies/ records that were reviewed today include: see above.   ASSESSMENT AND PLAN:  1.  Chest pain may be muscular skeletal but with family hx will do exercise myoview. She should try ibuprofen 3 tabs to see if it helps prevent pain.  Also pepcid 20 mg to see if any indigestion causing. Will check echo perhaps pericarditis.   We will call results but follow up in 4 weeks with Dr. Mayford Knife.  2. Tobacco use is plannign to stop.  3. Bronchitis but with chest pain will check CXR.       Current medicines are reviewed with the patient today.  The patient Has no concerns regarding medicines.  The following changes have been made:  See above Labs/ tests ordered today include:see above  Disposition:   FU:  see above  Signed, Nada Boozer, NP  03/05/2016 7:53 PM    [facility]  [facility] [address] [place], [place], [place]  [address]/ [address] [place], [place] Phone: [phone]; Fax: [phone]  [phone]

## 2016-04-01 NOTE — Discharge Instructions (Signed)
Radial Site Care °Refer to this sheet in the next few weeks. These instructions provide you with information about caring for yourself after your procedure. Your health care provider may also give you more specific instructions. Your treatment has been planned according to current medical practices, but problems sometimes occur. Call your health care provider if you have any problems or questions after your procedure. °WHAT TO EXPECT AFTER THE PROCEDURE °After your procedure, it is typical to have the following: °· Bruising at the radial site that usually fades within 1-2 weeks. °· Blood collecting in the tissue (hematoma) that may be painful to the touch. It should usually decrease in size and tenderness within 1-2 weeks. °HOME CARE INSTRUCTIONS °· Take medicines only as directed by your health care provider. °· You may shower 24-48 hours after the procedure or as directed by your health care provider. Remove the bandage (dressing) and gently wash the site with plain soap and water. Pat the area dry with a clean towel. Do not rub the site, because this may cause bleeding. °· Do not take baths, swim, or use a hot tub until your health care provider approves. °· Check your insertion site every day for redness, swelling, or drainage. °· Do not apply powder or lotion to the site. °· Do not flex or bend the affected arm for 24 hours or as directed by your health care provider. °· Do not push or pull heavy objects with the affected arm for 24 hours or as directed by your health care provider. °· Do not lift over 10 lb (4.5 kg) for 5 days after your procedure or as directed by your health care provider. °· Ask your health care provider when it is okay to: °¨ Return to work or school. °¨ Resume usual physical activities or sports. °¨ Resume sexual activity. °· Do not drive home if you are discharged the same day as the procedure. Have someone else drive you. °· You may drive 24 hours after the procedure unless otherwise  instructed by your health care provider. °· Do not operate machinery or power tools for 24 hours after the procedure. °· If your procedure was done as an outpatient procedure, which means that you went home the same day as your procedure, a responsible adult should be with you for the first 24 hours after you arrive home. °· Keep all follow-up visits as directed by your health care provider. This is important. °SEEK MEDICAL CARE IF: °· You have a fever. °· You have chills. °· You have increased bleeding from the radial site. Hold pressure on the site. °SEEK IMMEDIATE MEDICAL CARE IF: °· You have unusual pain at the radial site. °· You have redness, warmth, or swelling at the radial site. °· You have drainage (other than a small amount of blood on the dressing) from the radial site. °· The radial site is bleeding, and the bleeding does not stop after 30 minutes of holding steady pressure on the site. °· Your arm or hand becomes pale, cool, tingly, or numb. °  °This information is not intended to replace advice given to you by your health care provider. Make sure you discuss any questions you have with your health care provider. °  °Document Released: 10/10/2010 Document Revised: 09/28/2014 Document Reviewed: 03/26/2014 °Elsevier Interactive Patient Education ©2016 [employer] ° °

## 2016-04-02 ENCOUNTER — Encounter (HOSPITAL_COMMUNITY): Payer: Self-pay | Admitting: Cardiovascular Disease

## 2016-04-02 ENCOUNTER — Telehealth: Payer: Self-pay

## 2016-04-02 NOTE — Telephone Encounter (Signed)
She slept fine last night after her cath and late this morning, and woke up "really hot" today. She noticed red skin on her chest and flushed face when she looked in the mirror. She st her skin feels "on fire." Her temperature is 99.4. HR ranging from 80-110. She took Ibuprofen shortly before calling. Confirmed with patient she has no other symptoms than flushed skin. She has no trouble breathing.  Patient st she was premedicated for her cath yesterday, and she had to be medicated afterward for hives.  Instructed patient to take Benadryl 50 mg and to seek immediate medical attention if symptoms worsen or if breathing becomes difficult. She was grateful for call and will follow-up tomorrow.

## 2016-04-03 ENCOUNTER — Telehealth: Payer: Self-pay

## 2016-04-03 DIAGNOSIS — R9439 Abnormal result of other cardiovascular function study: Secondary | ICD-10-CM

## 2016-04-03 NOTE — Telephone Encounter (Signed)
Beryle QuantObenshine, Karmah L - 03/31/16 >','<< Less Detail',event)" href="javascript:;"><< Less Detail   Quintella Reichertraci R Turner, MD   Sent: Wed April 01, 2016 2:14 PM    To: Henrietta DineKathryn A Abiageal Blowe, RN        Message     Please have her come in for fasting lipids    Patient will have fasting lipids drawn at the [facility] office.  Orders placed for patient to complete at her convenience.

## 2016-04-08 ENCOUNTER — Ambulatory Visit (INDEPENDENT_AMBULATORY_CARE_PROVIDER_SITE_OTHER): Payer: 59 | Admitting: Cardiology

## 2016-04-08 ENCOUNTER — Encounter: Payer: Self-pay | Admitting: Cardiology

## 2016-04-08 VITALS — BP 118/74 | HR 74 | Ht 68.0 in | Wt 174.4 lb

## 2016-04-08 DIAGNOSIS — R079 Chest pain, unspecified: Secondary | ICD-10-CM

## 2016-04-08 DIAGNOSIS — Z720 Tobacco use: Secondary | ICD-10-CM | POA: Diagnosis not present

## 2016-04-08 DIAGNOSIS — R9439 Abnormal result of other cardiovascular function study: Secondary | ICD-10-CM | POA: Diagnosis not present

## 2016-04-08 DIAGNOSIS — F17200 Nicotine dependence, unspecified, uncomplicated: Secondary | ICD-10-CM

## 2016-04-08 DIAGNOSIS — K219 Gastro-esophageal reflux disease without esophagitis: Secondary | ICD-10-CM

## 2016-04-08 HISTORY — DX: Gastro-esophageal reflux disease without esophagitis: K21.9

## 2016-04-08 NOTE — Patient Instructions (Signed)
Medication Instructions:  1) START PRILOSEC 20 mg OTC daily for 2 weeks then AS NEEDED   Labwork: None  Testing/Procedures: None  Follow-Up: Your physician recommends that you schedule a follow-up appointment AS NEEDED with Dr. Mayford Knifeurner.  Any Other Special Instructions Will Be Listed Below (If Applicable).     If you need a refill on your cardiac medications before your next appointment, please call your pharmacy.

## 2016-04-08 NOTE — Progress Notes (Signed)
Cardiology Office Note    Date:  04/08/2016   ID:  Victoria Barr, DOB [birth year], MRN [id number]  PCP:  Frederich Chick, MD  Cardiologist:  Armanda Magic, MD   Chief Complaint  Patient presents with  . Chest Pain    History of Present Illness:  Victoria Barr is a 45 y.o. female with a remote history of MVP although the most recent echo did not show any MVP or any other abnormality and normal LVF.  She recently was having some chest tightness that , at times, radiated into her left jaw.  She underwent nuclear stress test which showed basal inferoseptal ischemia with EKG changes.  She underwent cath which showed luminal irregularities.  She continues to smoke but her PCP recently gave her wellbutrin that she has not gotten filled.  She is still having occasional CP.      Past Medical History  Diagnosis Date  . Palpitations   . MVP (mitral valve prolapse)     echo in 2012 did not show MVP but did have mild elongation of the AMVL  . Allergy   . Anxiety   . Arthritis   . Depression   . Hyperlipidemia   . Tachycardia   . Intestinal malrotation   . Hypertension 05/2013  . History of abnormal cervical Pap smear 2006, 2015    2015 Neg with + HR HPV, 09/2014 ASCUS with + HR HPV, colpo with LEEP 11/2014-LGSIL (CIN-I)  . Pyelonephritis 03/2015    evaluated by urology (Dr. Vernie Ammons)  . Recurrent UTI   . GERD (gastroesophageal reflux disease) 04/08/2016    Past Surgical History  Procedure Laterality Date  . Intestinal malrotation repair  1999  . Tubal ligation    . Appendectomy  1999  . Colposcopy  2006, 2015    2006 LGSIL, normal since; 2015 LGSIL w/LEEP  . Excision morton's neuroma Bilateral 2002  . Nasal sinus surgery    . Cervical biopsy  w/ loop electrode excision  11/28/14    LGSIL with negative margins  . Hemorrhoid surgery    . Cardiac catheterization N/A 04/01/2016    Procedure: Left Heart Cath and Coronary Angiography;  Surgeon: Kathleene Hazel, MD;  Location: [facility]  INVASIVE CV LAB;  Service: Cardiovascular;  Laterality: N/A;    Current Medications: Outpatient Prescriptions Prior to Visit  Medication Sig Dispense Refill  . ibuprofen (ADVIL,MOTRIN) 200 MG tablet Take 200-800 mg by mouth every 6 (six) hours as needed for headache.    . Probiotic CAPS Take 1 capsule by mouth daily.    . benzonatate (TESSALON) 100 MG capsule Take 1-2 capsules (100-200 mg total) by mouth 3 (three) times daily as needed for cough. (Patient not taking: Reported on 04/01/2016) 40 capsule 0  . nitrofurantoin, macrocrystal-monohydrate, (MACROBID) 100 MG capsule Take 100 mg by mouth daily as needed (uti symptoms). Reported on 04/01/2016  11   No facility-administered medications prior to visit.     Allergies:   Imitrex; Iohexol; Prozac; Penicillins; Sulfa antibiotics; and Zoloft   Social History   Social History  . Marital Status: Single    Spouse Name: N/A  . Number of Children: N/A  . Years of Education: N/A   Social History Main Topics  . Smoking status: Current Some Day Smoker  . Smokeless tobacco: Never Used     Comment: quit 1 year ago, sep 2013  . Alcohol Use: No     Comment: occasionally  . Drug Use: No  .  Sexual Activity: Not Currently    Birth Control/ Protection: Surgical     Comment: BTL   Other Topics Concern  . None   Social History Narrative     Family History:  The patient's family history includes Aneurysm (age of onset: 42) in her father; Cancer in her paternal grandfather and paternal grandmother; Heart disease in her maternal grandmother and mother; Hypertension in her mother and sister.   ROS:   Please see the history of present illness.    ROS All other systems reviewed and are negative.   PHYSICAL EXAM:   VS:  BP 118/74 mmHg  Pulse 74  Ht  (1.727 m)  Wt 174 lb 6.4 oz (79.107 kg)  BMI 26.52 kg/m2  LMP 03/16/2016   GEN: Well nourished, well developed, in no acute distress HEENT: normal Neck: no JVD, carotid bruits, or  masses Cardiac: RRR; no murmurs, rubs, or gallops,no edema.  Intact distal pulses bilaterally.  Respiratory:  clear to auscultation bilaterally, normal work of breathing GI: soft, nontender, nondistended, + BS MS: no deformity or atrophy Skin: warm and dry, no rash Neuro:  Alert and Oriented x 3, Strength and sensation are intact Psych: euthymic mood, full affect  Wt Readings from Last 3 Encounters:  04/08/16 174 lb 6.4 oz (79.107 kg)  04/01/16 170 lb (77.111 kg)  03/30/16 176 lb (79.833 kg)      Studies/Labs Reviewed:   EKG:  EKG is not ordered today.   Recent Labs: 01/21/2016: ALT 10 03/05/2016: TSH 0.72 03/31/2016: BUN 14; Creat 0.81; Hemoglobin 13.9; Platelets 288; Potassium 4.6; Sodium 140   Lipid Panel    Component Value Date/Time   CHOL 221* 11/14/2014 0754   TRIG 99.0 11/14/2014 0754   HDL 76.40 11/14/2014 0754   CHOLHDL 3 11/14/2014 0754   VLDL 19.8 11/14/2014 0754   LDLCALC 125* 11/14/2014 0754    Additional studies/ records that were reviewed today include:  Cath report    ASSESSMENT:    1. Chest pain, unspecified chest pain type   2. Gastroesophageal reflux disease without esophagitis   3. Abnormal stress test   4. Smoker      PLAN:  In order of problems listed above:  1. Chest pain that is noncardiac.  I am suspicious that this may be due to esophageal spasm with GERD.  She has a history of GERD in the past and had been on omeprazole at one point.  I have recommended that she start OTC Prilosec  daily for 2 weeks and then PRN.  I have asked her to let me know if how she feels in 2 weeks.  I will get a copy of her most recent lipids from her PCP.   2. GERD with possible esophageal spasm - as above start Omeprazole. 3. Abnormal stress test with luminal irregularities at cath.  I have discussed with her the importance of quitting smoking to prevent development of significant CAD. 4. Tobacco abuse - I have encouraged her to start the Wellbutrin to try  to quit smoking.      Medication Adjustments/Labs and Tests Ordered: Current medicines are reviewed at length with the patient today.  Concerns regarding medicines are outlined above.  Medication changes, Labs and Tests ordered today are listed in the Patient Instructions below.  Patient Instructions  Medication Instructions:  1) START PRILOSEC 20 mg OTC daily for 2 weeks then AS NEEDED   Labwork: None  Testing/Procedures: None  Follow-Up: Your physician recommends that you schedule  a follow-up appointment AS NEEDED with Dr. Mayford Knifeurner.  Any Other Special Instructions Will Be Listed Below (If Applicable).     If you need a refill on your cardiac medications before your next appointment, please call your pharmacy.       Signed, Armanda Magicraci Jacub Waiters, MD  04/08/2016 3:44 PM    [facility] [address], [place], [place]  [address] Phone: [phone]; Fax: [phone]

## 2016-04-13 ENCOUNTER — Encounter: Payer: Self-pay | Admitting: Cardiology

## 2016-04-21 ENCOUNTER — Other Ambulatory Visit: Payer: Self-pay

## 2016-04-21 DIAGNOSIS — E7800 Pure hypercholesterolemia, unspecified: Secondary | ICD-10-CM

## 2016-05-03 DIAGNOSIS — R300 Dysuria: Secondary | ICD-10-CM | POA: Diagnosis not present

## 2016-05-03 DIAGNOSIS — J029 Acute pharyngitis, unspecified: Secondary | ICD-10-CM | POA: Diagnosis not present

## 2016-05-03 DIAGNOSIS — R590 Localized enlarged lymph nodes: Secondary | ICD-10-CM | POA: Diagnosis not present

## 2016-05-05 DIAGNOSIS — N3000 Acute cystitis without hematuria: Secondary | ICD-10-CM | POA: Diagnosis not present

## 2016-05-05 DIAGNOSIS — R309 Painful micturition, unspecified: Secondary | ICD-10-CM | POA: Diagnosis not present

## 2016-05-05 MED FILL — CIPROFLOXACIN HCL 500 MG TA: 500 | 5 days supply | Qty: 10 | Fill #0

## 2016-05-20 ENCOUNTER — Encounter: Payer: Self-pay | Admitting: Obstetrics and Gynecology

## 2016-05-20 ENCOUNTER — Telehealth: Payer: Self-pay | Admitting: Nurse Practitioner

## 2016-05-20 ENCOUNTER — Ambulatory Visit (INDEPENDENT_AMBULATORY_CARE_PROVIDER_SITE_OTHER): Payer: 59 | Admitting: Obstetrics and Gynecology

## 2016-05-20 VITALS — BP 118/76 | HR 72 | Resp 16 | Ht 68.0 in | Wt 176.4 lb

## 2016-05-20 DIAGNOSIS — N760 Acute vaginitis: Secondary | ICD-10-CM

## 2016-05-20 DIAGNOSIS — B373 Candidiasis of vulva and vagina: Secondary | ICD-10-CM

## 2016-05-20 DIAGNOSIS — B3731 Acute candidiasis of vulva and vagina: Secondary | ICD-10-CM

## 2016-05-20 MED ORDER — BETAMETHASONE VALERATE 0.1 % EX OINT: TOPICAL_OINTMENT | CUTANEOUS | 0 refills | Status: DC

## 2016-05-20 MED ORDER — FLUCONAZOLE 150 MG PO TABS
150.0000 mg | ORAL_TABLET | Freq: Once | ORAL | 0 refills | Status: AC
Start: 2016-05-20 — End: 2016-05-20

## 2016-05-20 MED FILL — FLUCONAZOLE 150 MG TABLET: 150 | 3 days supply | Qty: 2 | Fill #0

## 2016-05-20 NOTE — Patient Instructions (Signed)

## 2016-05-20 NOTE — Telephone Encounter (Signed)
erro  neous encounter

## 2016-05-20 NOTE — Progress Notes (Signed)
GYNECOLOGY  VISIT   HPI: 45 y.o.   Single  Caucasian  female   G1P1001 with Patient's last menstrual period was 05/09/2016.   here for vaginal itching and burning since last night. Symptoms are severe. Uncomfortable to sit. Denies discharge, no odor, no urinary c/o.  Patient was on antibiotics 2 weeks ago for UTI.    GYNECOLOGIC HISTORY: Patient's last menstrual period was 05/09/2016. Contraception:tubal ligation  Menopausal hormone therapy: none        OB History    Gravida Para Term Preterm AB Living   1 1 1  0 0 1   SAB TAB Ectopic Multiple Live Births   0 0 0 0 1         Patient Active Problem List   Diagnosis Date Noted  . GERD (gastroesophageal reflux disease) 04/08/2016  . Precordial pain   . Abnormal stress test   . Vitamin D deficiency 05/23/2015  . Depression 05/23/2015  . Acute infection of nasal sinus 04/25/2015  . Bilateral leg pain 11/13/2014  . Smoker 05/02/2014  . LGSIL (low grade squamous intraepithelial dysplasia) 05/02/2014  . VIN I (vulvar intraepithelial neoplasia I) 05/02/2014  . Chest pain 11/30/2013  . Abnormal EKG 11/30/2013  . Palpitations   . MVP (mitral valve prolapse)   . Dysuria 06/09/2013    Past Medical History:  Diagnosis Date  . Allergy   . Anxiety   . Arthritis   . Depression   . GERD (gastroesophageal reflux disease) 04/08/2016  . History of abnormal cervical Pap smear 2006, 2015   2015 Neg with + HR HPV, 09/2014 ASCUS with + HR HPV, colpo with LEEP 11/2014-LGSIL (CIN-I)  . Hyperlipidemia   . Hypertension 05/2013  . Intestinal malrotation   . MVP (mitral valve prolapse)    echo in 2012 did not show MVP but did have mild elongation of the AMVL  . Palpitations   . Pyelonephritis 03/2015   evaluated by urology (Dr. Vernie Ammons)  . Recurrent UTI   . Tachycardia     Past Surgical History:  Procedure Laterality Date  . APPENDECTOMY  1999  . CARDIAC CATHETERIZATION N/A 04/01/2016   Procedure: Left Heart Cath and Coronary Angiography;   Surgeon: Kathleene Hazel, MD;  Location: [facility] INVASIVE CV LAB;  Service: Cardiovascular;  Laterality: N/A;  . CERVICAL BIOPSY  W/ LOOP ELECTRODE EXCISION  11/28/14   LGSIL with negative margins  . COLPOSCOPY  2006, 2015   2006 LGSIL, normal since; 2015 LGSIL w/LEEP  . EXCISION MORTON'S NEUROMA Bilateral 2002  . HEMORRHOID SURGERY    . INTESTINAL MALROTATION REPAIR  1999  . NASAL SINUS SURGERY    . TUBAL LIGATION      Current Outpatient Prescriptions  Medication Sig Dispense Refill  . ibuprofen (ADVIL,MOTRIN) 200 MG tablet Take 200-800 mg by mouth every 6 (six) hours as needed for headache.    Marland Kitchen omeprazole (PRILOSEC) 20 MG capsule Take 20 mg by mouth daily.    . Probiotic CAPS Take 1 capsule by mouth daily.     No current facility-administered medications for this visit.      ALLERGIES: Imitrex [sumatriptan]; Iohexol; Prozac [fluoxetine hcl]; Penicillins; Sulfa antibiotics; and Zoloft [sertraline hcl]  Family History  Problem Relation Age of Onset  . Heart disease Mother   . Hypertension Mother   . Aneurysm Father 36    brain   . Hypertension Sister   . Heart disease Maternal Grandmother   . Cancer Paternal Grandmother   . Cancer Paternal  Grandfather     Social History   Social History  . Marital status: Single    Spouse name: N/A  . Number of children: N/A  . Years of education: N/A   Occupational History  . Not on file.   Social History Main Topics  . Smoking status: Current Some Day Smoker  . Smokeless tobacco: Never Used     Comment: quit 1 year ago, sep 2013  . Alcohol use No     Comment: occasionally  . Drug use: No  . Sexual activity: Not Currently    Birth control/ protection: Surgical     Comment: BTL   Other Topics Concern  . Not on file   Social History Narrative  . No narrative on file    Review of Systems  Constitutional: Negative.   HENT: Negative.   Eyes: Negative.   Respiratory: Negative.   Cardiovascular: Negative.    Gastrointestinal: Negative.   Genitourinary:       Vaginal itching Vaginal burning   Musculoskeletal: Negative.   Skin: Negative.   Neurological: Negative.   Endo/Heme/Allergies: Negative.   Psychiatric/Behavioral: Negative.     PHYSICAL EXAMINATION:    BP 118/76 (BP Location: Right Arm, Patient Position: Sitting, Cuff Size: Normal)   Pulse 72   Resp 16   Ht 5\' 8"  (1.727 m)   Wt 176 lb 6.4 oz (80 kg)   LMP 05/09/2016   BMI 26.82 kg/m     General appearance: alert, cooperative and appears stated age   Pelvic: External genitalia:  no lesions, mild erythema              Urethra:  normal appearing urethra with no masses, tenderness or lesions              Bartholins and Skenes: normal                 Vagina: normal appearing vagina with normal color and discharge, no lesions              Cervix: no lesions               Chaperone was present for exam.  Wet prep: no clue, no trich, ++ wbc KOH: + yeast PH: 4   ASSESSMENT Vulvovaginitis, yeast    PLAN Steroid ointment Diflucan    An After Visit Summary was printed and given to the patient.

## 2016-05-21 DIAGNOSIS — M545 Low back pain: Secondary | ICD-10-CM | POA: Diagnosis not present

## 2016-05-21 MED FILL — DICLOFENAC SOD EC 75 MG TAB: 75 | 30 days supply | Qty: 60 | Fill #0

## 2016-05-21 MED FILL — METHOCARBAMOL 500 MG TABLET: 500 | 15 days supply | Qty: 60 | Fill #0

## 2016-06-04 DIAGNOSIS — E559 Vitamin D deficiency, unspecified: Secondary | ICD-10-CM | POA: Diagnosis not present

## 2016-06-04 DIAGNOSIS — J029 Acute pharyngitis, unspecified: Secondary | ICD-10-CM | POA: Diagnosis not present

## 2016-06-09 DIAGNOSIS — M25551 Pain in right hip: Secondary | ICD-10-CM | POA: Diagnosis not present

## 2016-06-09 DIAGNOSIS — M545 Low back pain: Secondary | ICD-10-CM | POA: Diagnosis not present

## 2016-06-09 DIAGNOSIS — M47817 Spondylosis without myelopathy or radiculopathy, lumbosacral region: Secondary | ICD-10-CM | POA: Diagnosis not present

## 2016-06-25 ENCOUNTER — Ambulatory Visit: Payer: 59 | Admitting: Physical Therapy

## 2016-06-26 ENCOUNTER — Other Ambulatory Visit: Payer: Self-pay | Admitting: Obstetrics and Gynecology

## 2016-06-26 DIAGNOSIS — Z1231 Encounter for screening mammogram for malignant neoplasm of breast: Secondary | ICD-10-CM

## 2016-06-30 ENCOUNTER — Ambulatory Visit (INDEPENDENT_AMBULATORY_CARE_PROVIDER_SITE_OTHER): Payer: 59 | Admitting: Nurse Practitioner

## 2016-06-30 ENCOUNTER — Encounter: Payer: Self-pay | Admitting: Nurse Practitioner

## 2016-06-30 VITALS — BP 108/66 | HR 56 | Ht 68.25 in | Wt 173.0 lb

## 2016-06-30 DIAGNOSIS — N870 Mild cervical dysplasia: Secondary | ICD-10-CM

## 2016-06-30 DIAGNOSIS — Z01411 Encounter for gynecological examination (general) (routine) with abnormal findings: Secondary | ICD-10-CM | POA: Diagnosis not present

## 2016-06-30 DIAGNOSIS — Z0000 Encounter for general adult medical examination without abnormal findings: Secondary | ICD-10-CM

## 2016-06-30 DIAGNOSIS — Z1151 Encounter for screening for human papillomavirus (HPV): Secondary | ICD-10-CM | POA: Diagnosis not present

## 2016-06-30 DIAGNOSIS — Z803 Family history of malignant neoplasm of breast: Secondary | ICD-10-CM

## 2016-06-30 MED ORDER — IBUPROFEN 800 MG PO TABS: 800.0000 mg | ORAL_TABLET | Freq: Three times a day (TID) | ORAL | 1 refills | Status: DC | PRN

## 2016-06-30 MED FILL — IBUPROFEN 800 MG TABLET: 800 | 10 days supply | Qty: 30 | Fill #0
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
## 2016-06-30 NOTE — Progress Notes (Signed)
Encounter reviewed by Dr. Elvin Mccartin Amundson C. Silva.  

## 2016-06-30 NOTE — Patient Instructions (Signed)

## 2016-07-02 LAB — IPS PAP TEST WITH HPV

## 2016-07-16 ENCOUNTER — Ambulatory Visit: Payer: Self-pay

## 2016-08-20 ENCOUNTER — Ambulatory Visit
Admission: RE | Admit: 2016-08-20 | Discharge: 2016-08-20 | Disposition: A | Discharge status: Home / Self Care | Payer: 59 | Source: Ambulatory Visit | Attending: Obstetrics and Gynecology | Admitting: Obstetrics and Gynecology

## 2016-08-20 DIAGNOSIS — Z1231 Encounter for screening mammogram for malignant neoplasm of breast: Secondary | ICD-10-CM

## 2016-09-17 DIAGNOSIS — K529 Noninfective gastroenteritis and colitis, unspecified: Secondary | ICD-10-CM | POA: Diagnosis not present

## 2016-10-20 ENCOUNTER — Other Ambulatory Visit: Payer: Self-pay | Admitting: Orthopedic Surgery

## 2016-10-20 DIAGNOSIS — M47817 Spondylosis without myelopathy or radiculopathy, lumbosacral region: Secondary | ICD-10-CM | POA: Diagnosis not present

## 2016-10-20 DIAGNOSIS — M549 Dorsalgia, unspecified: Secondary | ICD-10-CM

## 2016-10-26 IMAGING — MR MR LUMBAR SPINE W/O CM
4 of 5 series · 19 of 48 positions shown · non-contrast
Comparison: MRI lumbar spine 11/05/2012.

CLINICAL DATA: Low back pain, with BILATERAL leg pain tingling and
numbness.

EXAM:
MRI LUMBAR SPINE WITHOUT CONTRAST
TECHNIQUE: Multiplanar, multisequence MR imaging of the lumbar spine was
performed. No intravenous contrast was administered.

[Series 3: T1 · sagittal · 4.0mm · 0.51mm/px · 3 of 15 slices shown (1 of 2)]
[im 3/15]
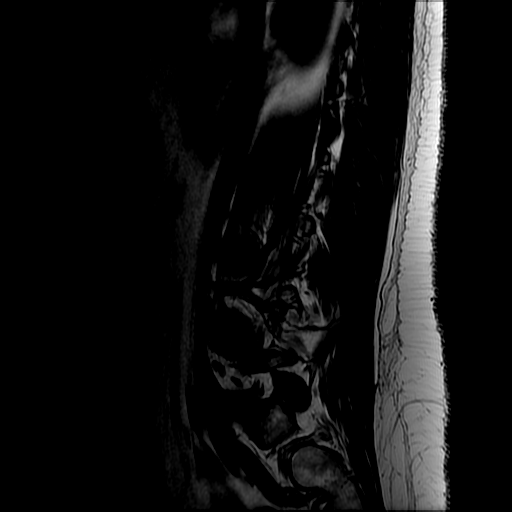
[im 8/15]
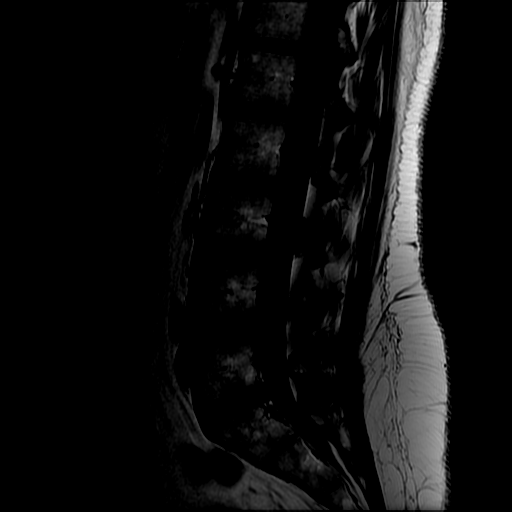
[im 12/15]
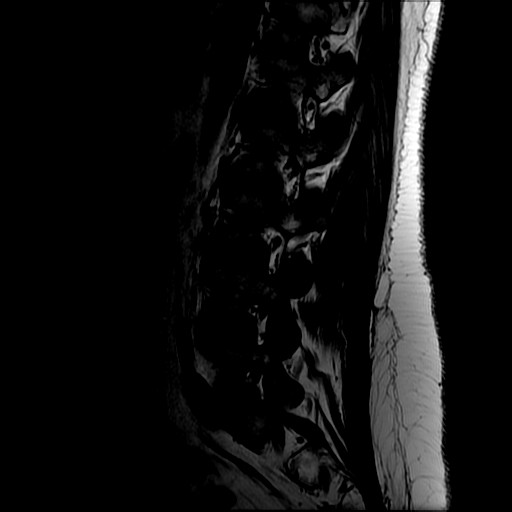

[Series 4: T2 · sagittal · 4.0mm · 0.51mm/px · 7 of 15 slices shown (1 of 2)]
[im 1/15]
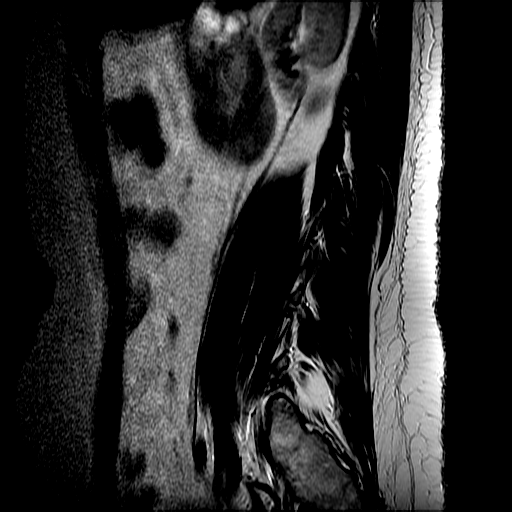
[im 3/15]
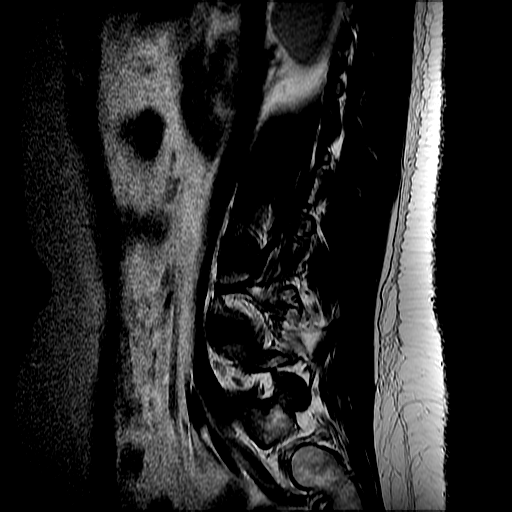
[im 5/15]
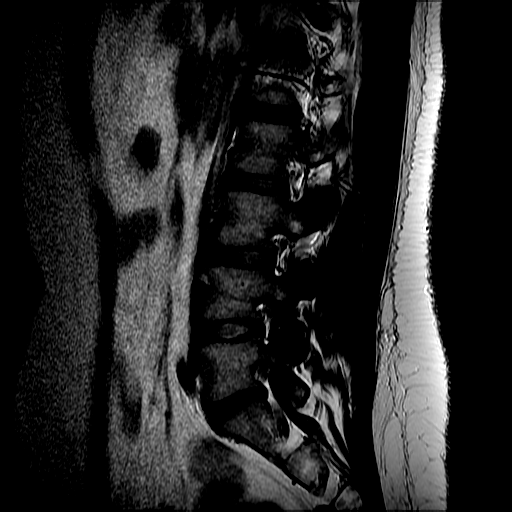
[im 8/15]
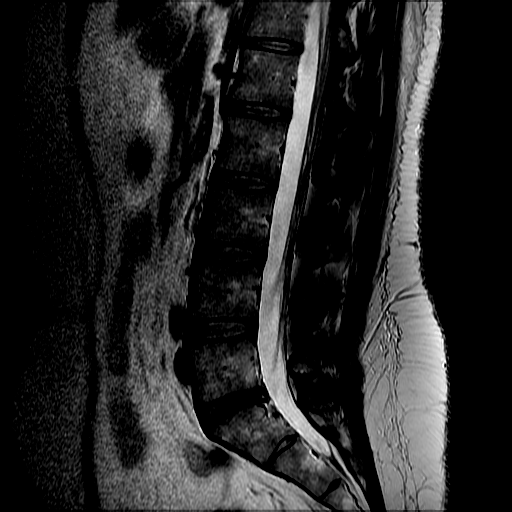
[im 10/15]
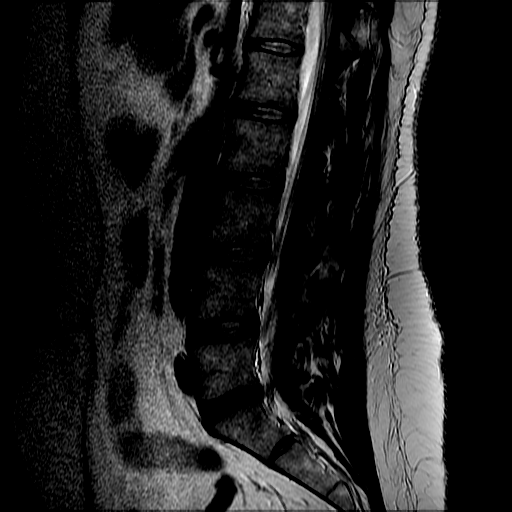
[im 12/15]
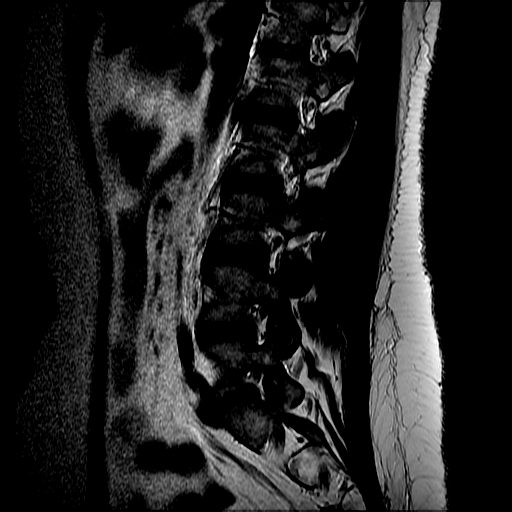
[im 15/15]
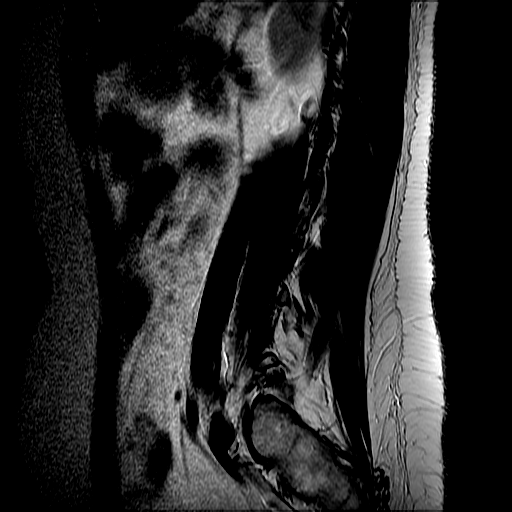

[Series 6: T2 · axial · 4.0mm · 0.39mm/px · z∈[-154,+11]mm · 6 of 34 slices shown (2 of 2)]
[im 1/34]
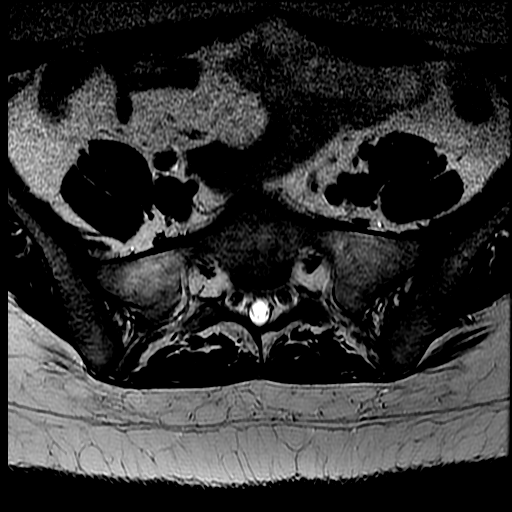
[im 6/34]
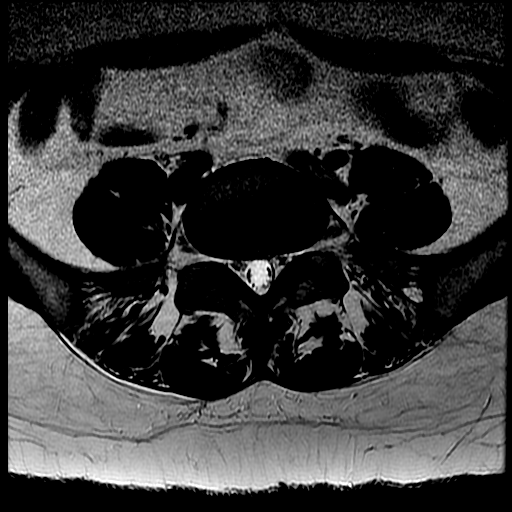
[im 11/34]
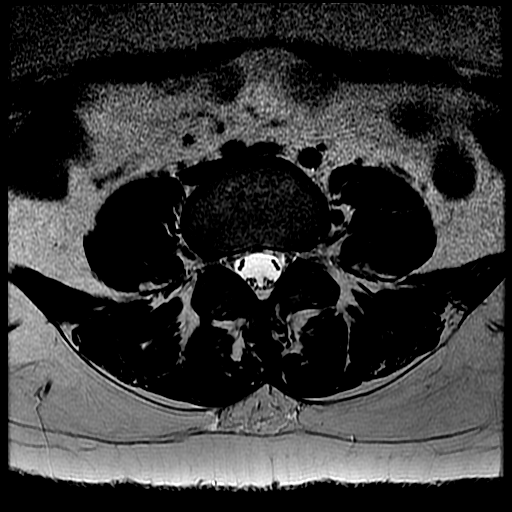
[im 16/34]
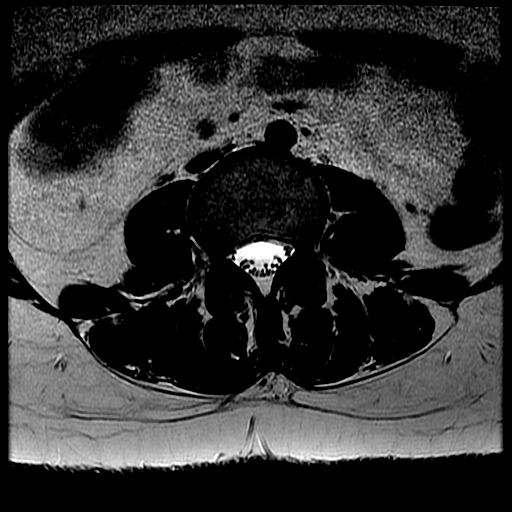
[im 18/34]
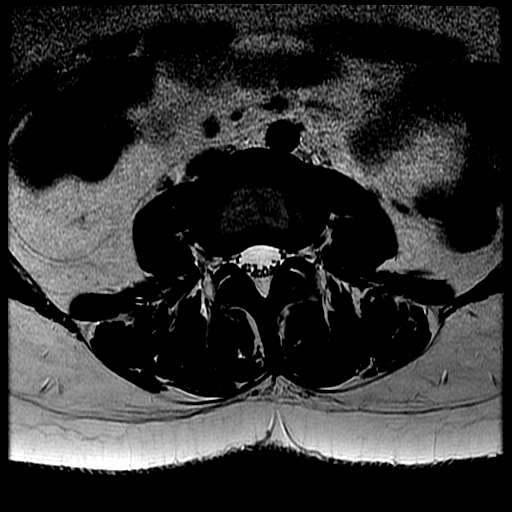
[im 28/34]
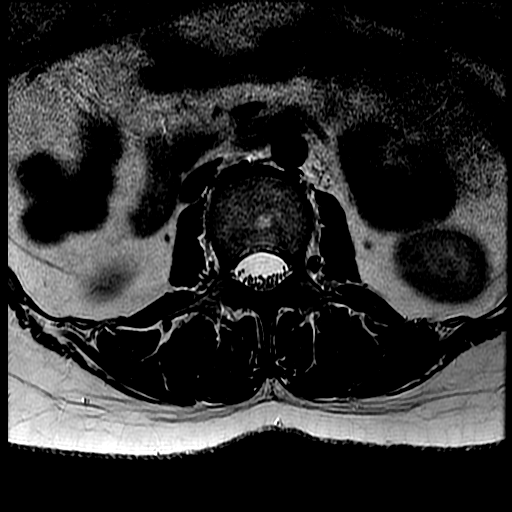

[Series 7: T1 · axial · 4.0mm · 0.39mm/px · z∈[-129,+11]mm · 3 of 34 slices shown (2 of 2)]
[im 6/34]
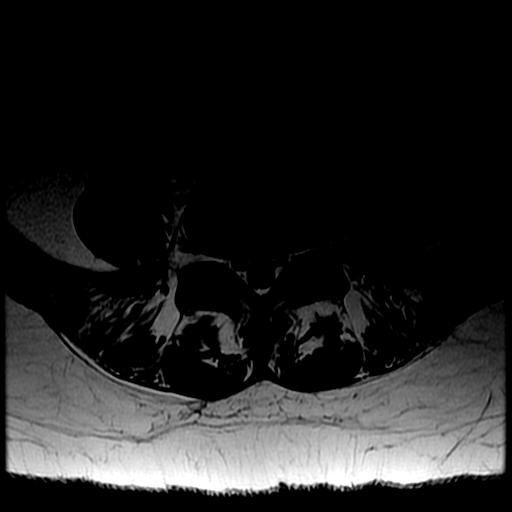
[im 18/34]
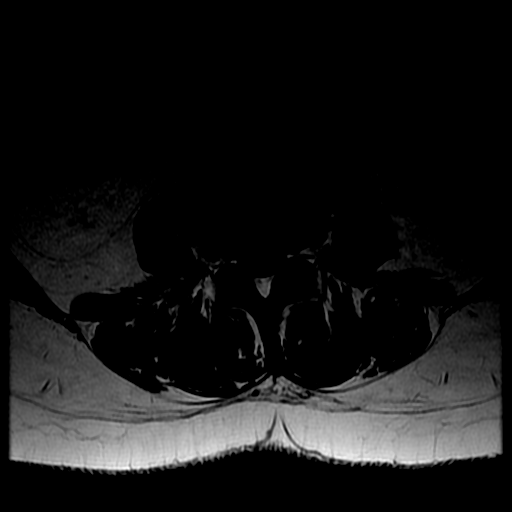
[im 28/34]
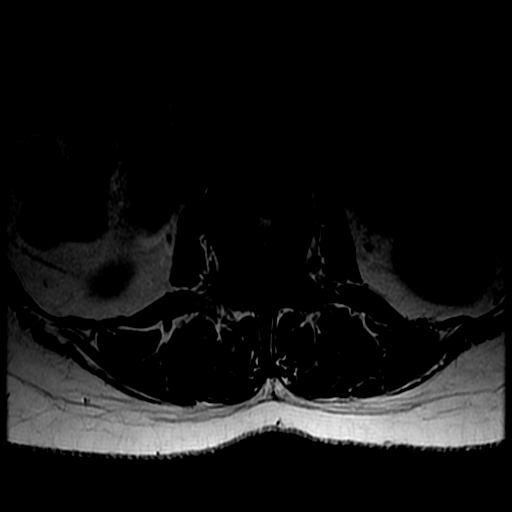

[19 of 48 positions shown; findings below may reference images not displayed]

FINDINGS: Segmentation: Normal

Alignment:  Normal.

Vertebrae: No worrisome osseous lesion.

Conus medullaris: Normal in size, signal, and location.

Paraspinal tissues: No evidence for hydronephrosis or paravertebral
mass.

Disc levels:

L1-L2:  Normal.

L2-L3:  Normal.

L3-L4:  Normal.

L4-L5:  Normal disc space.  Mild facet arthropathy.

L5-S1: Small central protrusion in the midline has regressed from
priors, and is noncompressive. Mild facet arthropathy. BILATERAL
foraminal narrowing due to disc material does not clearly affect the
L5 nerve roots.
IMPRESSION: Partial regression of small central protrusion at L5-S1. Mild
BILATERAL foraminal narrowing. No definite neural impingement.

## 2016-11-06 ENCOUNTER — Ambulatory Visit
Admission: RE | Admit: 2016-11-06 | Discharge: 2016-11-06 | Disposition: A | Discharge status: Home / Self Care | Payer: 59 | Source: Ambulatory Visit | Attending: Orthopedic Surgery | Admitting: Orthopedic Surgery

## 2016-11-06 DIAGNOSIS — M549 Dorsalgia, unspecified: Secondary | ICD-10-CM

## 2016-11-06 DIAGNOSIS — M5126 Other intervertebral disc displacement, lumbar region: Secondary | ICD-10-CM | POA: Diagnosis not present

## 2016-11-16 ENCOUNTER — Ambulatory Visit: Payer: 59 | Admitting: Nurse Practitioner

## 2016-11-16 NOTE — Progress Notes (Deleted)
GYNECOLOGY  VISIT   HPI: 46 y.o.   Single  {Race/ethnicity:17218}  female   G1P1001 with No LMP recorded.   here for     GYNECOLOGIC HISTORY: No LMP recorded. Contraception:*** Menopausal hormone therapy: ***        OB History    Gravida Para Term Preterm AB Living   1 1 1  0 0 1   SAB TAB Ectopic Multiple Live Births   0 0 0 0 1         Patient Active Problem List   Diagnosis Date Noted  . GERD (gastroesophageal reflux disease) 04/08/2016  . Precordial pain   . Abnormal stress test   . Vitamin D deficiency 05/23/2015  . Depression 05/23/2015  . Acute infection of nasal sinus 04/25/2015  . Bilateral leg pain 11/13/2014  . Smoker 05/02/2014  . LGSIL (low grade squamous intraepithelial dysplasia) 05/02/2014  . VIN I (vulvar intraepithelial neoplasia I) 05/02/2014  . Chest pain 11/30/2013  . Abnormal EKG 11/30/2013  . Palpitations   . MVP (mitral valve prolapse)   . Dysuria 06/09/2013    Past Medical History:  Diagnosis Date  . Allergy   . Anxiety   . Arthritis   . Depression   . GERD (gastroesophageal reflux disease) 04/08/2016  . History of abnormal cervical Pap smear 2006, 2015   2015 Neg with + HR HPV, 09/2014 ASCUS with + HR HPV, colpo with LEEP 11/2014-LGSIL (CIN-I)  . Hyperlipidemia   . Hypertension 05/2013  . Intestinal malrotation   . MVP (mitral valve prolapse)    echo in 2012 did not show MVP but did have mild elongation of the AMVL  . Palpitations   . Pyelonephritis 03/2015   evaluated by urology (Dr. Vernie Ammons)  . Recurrent UTI   . Tachycardia     Past Surgical History:  Procedure Laterality Date  . APPENDECTOMY  1999  . CARDIAC CATHETERIZATION N/A 04/01/2016   Procedure: Left Heart Cath and Coronary Angiography;  Surgeon: Kathleene Hazel, MD;  Location: [facility] INVASIVE CV LAB;  Service: Cardiovascular;  Laterality: N/A;  . CERVICAL BIOPSY  W/ LOOP ELECTRODE EXCISION  11/28/14   LGSIL with negative margins  . COLPOSCOPY  2006, 2015   2006 LGSIL,  normal since; 2015 LGSIL w/LEEP  . EXCISION MORTON'S NEUROMA Bilateral 2002  . HEMORRHOID SURGERY    . INTESTINAL MALROTATION REPAIR  1999  . NASAL SINUS SURGERY    . TUBAL LIGATION      Current Outpatient Prescriptions  Medication Sig Dispense Refill  . ibuprofen (ADVIL,MOTRIN) 200 MG tablet Take 200-800 mg by mouth every 6 (six) hours as needed for headache.    . ibuprofen (ADVIL,MOTRIN) 800 MG tablet Take 1 tablet (800 mg total) by mouth every 8 (eight) hours as needed. 30 tablet 1  . Probiotic CAPS Take 1 capsule by mouth daily.     No current facility-administered medications for this visit.      ALLERGIES: Imitrex [sumatriptan]; Iohexol; Prozac [fluoxetine hcl]; Penicillins; Sulfa antibiotics; and Zoloft [sertraline hcl]  Family History  Problem Relation Age of Onset  . Heart disease Mother   . Hypertension Mother   . Aneurysm Father 36    brain   . Hypertension Sister   . Heart disease Maternal Grandmother   . Cancer Paternal Grandmother   . Cancer Paternal Grandfather     Social History   Social History  . Marital status: Single    Spouse name: N/A  . Number of children: N/A  .  Years of education: N/A   Occupational History  . Not on file.   Social History Main Topics  . Smoking status: Current Some Day Smoker  . Smokeless tobacco: Never Used     Comment: quit 1 year ago, sep 2013  . Alcohol use No     Comment: occasionally  . Drug use: No  . Sexual activity: Not Currently    Birth control/ protection: Surgical     Comment: BTL   Other Topics Concern  . Not on file   Social History Narrative  . No narrative on file    ROS  PHYSICAL EXAMINATION:    There were no vitals taken for this visit.    General appearance: alert, cooperative and appears stated age Neck: no adenopathy, supple, symmetrical, trachea midline and thyroid {CHL AMB PHY EX THYROID NORM DEFAULT:[phone]::"normal to inspection and palpation"} Breasts: {Exam;  breast:13139::"normal appearance, no masses or tenderness"} Abdomen: soft, non-tender; bowel sounds normal; no masses,  no organomegaly  Pelvic: External genitalia:  no lesions              Urethra:  normal appearing urethra with no masses, tenderness or lesions              Bartholins and Skenes: normal                 Vagina: normal appearing vagina with normal color and discharge, no lesions              Cervix: {CHL AMB PHY EX CERVIX NORM DEFAULT:[phone]::"no lesions"}              Bimanual Exam:  Uterus:  {CHL AMB PHY EX UTERUS NORM DEFAULT:[id number]::"normal size, contour, position, consistency, mobility, non-tender"}              Adnexa: {CHL AMB PHY EX ADNEXA NO MASS DEFAULT:[phone]::"no mass, fullness, tenderness"}              Rectovaginal: {yes no:[id number]}.  Confirms.              Anus:  normal sphincter tone, no lesions  Chaperone was present for exam.  ASSESSMENT     PLAN    An After Visit Summary was printed and given to the patient.  *** minutes face to face time of which over 50% was spent in counseling.

## 2016-12-03 ENCOUNTER — Ambulatory Visit (INDEPENDENT_AMBULATORY_CARE_PROVIDER_SITE_OTHER): Payer: 59 | Admitting: Podiatry

## 2016-12-03 ENCOUNTER — Encounter: Payer: Self-pay | Admitting: Podiatry

## 2016-12-03 ENCOUNTER — Ambulatory Visit: Payer: 59

## 2016-12-03 DIAGNOSIS — M722 Plantar fascial fibromatosis: Secondary | ICD-10-CM | POA: Diagnosis not present

## 2016-12-03 MED ORDER — TRIAMCINOLONE ACETONIDE 10 MG/ML IJ SUSP
10.0000 mg | Freq: Once | INTRAMUSCULAR | Status: AC
  Administered 2016-12-03: 10 mg

## 2016-12-03 NOTE — Patient Instructions (Signed)

## 2016-12-07 NOTE — Progress Notes (Signed)
Subjective: Ms. Victoria Barr presents the office today for concerns of bilateral heel/arch pain which has been ongoing intermittently for the last 6 months. She states that she previously was treated for plantar fasciitis. At that point she had orthotics made which has not been wearing them. She has pain in the morning she first gets up also she stands all day on hard surfaces and she gets pain. She denies any numbness or tingling. No recent injury or trauma. The pain does not wake her up at night. No other treatment recently. Denies any systemic complaints such as fevers, chills, nausea, vomiting. No acute changes since last appointment, and no other complaints at this time.   Objective: AAO x3, NAD DP/PT pulses palpable bilaterally, CRT less than 3 seconds Tenderness to palpation along the plantar medial tubercle of the calcaneus at the insertion of plantar fascia on the left > right foot. There is mild pain along the course of the plantar fascia within the arch of the foot. Plantar fascia appears to be intact. There is no pain with lateral compression of the calcaneus or pain with vibratory sensation. There is no pain along the course or insertion of the achilles tendon. No other areas of tenderness to bilateral lower extremities. No open lesions or pre-ulcerative lesions.  No pain with calf compression, swelling, warmth, erythema  Assessment: Bilateral plantar fasciitis left worse than right  Plan: -All treatment options discussed with the patient including all alternatives, risks, complications.  -X-rays were obtained and reviewed with the patient. No evidence of acute fracture identified. -Patient elects to proceed with steroid injection into the left and right plantar heel. Under sterile skin preparation, a total of 2.5cc of kenalog 10, 0.5% Marcaine plain, and 2% lidocaine plain were infiltrated into the symptomatic area without complication. A band-aid was applied. Patient tolerated the  injection well without complication. Post-injection care with discussed with the patient. Discussed with the patient to ice the area over the next couple of days to help prevent a steroid flare.  -She states that she has myofascial braces at home and recommend her to continue these. -Stretching, icing daily. -Encourage orthotics. Discussed that she cannot undergo-year-old orthotics she'll need new ones. We'll evaluate this next appointment.  -Patient encouraged to call the office with any questions, concerns, change in symptoms.   Ovid CurdMatthew Wagoner, DPM

## 2016-12-28 ENCOUNTER — Telehealth: Payer: Self-pay | Admitting: Nurse Practitioner

## 2016-12-28 NOTE — Telephone Encounter (Signed)
Patient called requesting to speak with the nurse about her menstrual cycles and migraines. She said, "I normally get migraines with my periods and I had two of them during my last cycle started 12/18/16. Now, I am having another period, which is not normal for me and I've had more migraines too. It's not like a regular period because the blood is dark brown and it kind of looks like coffee grounds. I just need to know if I need an appointment of not."  Last seen: 11/16/16

## 2016-12-28 NOTE — Telephone Encounter (Signed)
Spoke with patient. Patient states that she had her last menses on 12/18/2016 which was very light. Patient had two migraines during that menses. Reports having migraines with menses, but frequency has increased. Patient states she just started bleeding again and it is dark drown in color. Reports bleeding is very light. Denies any pain, heavy bleeding, SOB, dizziness, or weakness. Advised will need to be seen for further evaluation. Patient is agreeable. Appointment scheduled for 01/01/2017 at 12:45 pm with Ria Comment, FNP. Patient is agreeable to date and time. Declines earlier appointment due to work schedule. Advised if symptoms worsen or develops new symptoms she will need to be seen earlier for evaluation. Patient is agreeable.  Routing to provider for final review. Patient agreeable to disposition. Will close encounter.

## 2017-01-01 ENCOUNTER — Ambulatory Visit: Payer: 59 | Admitting: Podiatry

## 2017-01-01 ENCOUNTER — Ambulatory Visit (INDEPENDENT_AMBULATORY_CARE_PROVIDER_SITE_OTHER): Payer: 59 | Admitting: Nurse Practitioner

## 2017-01-01 ENCOUNTER — Encounter: Payer: Self-pay | Admitting: Nurse Practitioner

## 2017-01-01 VITALS — BP 118/80 | HR 60 | Ht 68.25 in | Wt 174.0 lb

## 2017-01-01 DIAGNOSIS — Z803 Family history of malignant neoplasm of breast: Secondary | ICD-10-CM | POA: Diagnosis not present

## 2017-01-01 DIAGNOSIS — N926 Irregular menstruation, unspecified: Secondary | ICD-10-CM

## 2017-01-01 LAB — POCT URINE PREGNANCY: Preg Test, Ur: NEGATIVE

## 2017-01-01 MED ORDER — ONDANSETRON HCL 4 MG PO TABS: 4.0000 mg | ORAL_TABLET | Freq: Three times a day (TID) | ORAL | 0 refills | Status: DC | PRN

## 2017-01-01 MED FILL — ONDANSETRON HCL 4 MG TABLET: 4 | 6 days supply | Qty: 20 | Fill #0

## 2017-01-01 NOTE — Progress Notes (Signed)
GYNECOLOGY  VISIT   HPI: 46 y.o.   Single  Caucasian  female   G1P1001 with Patient's last menstrual period was 12/26/2016 (exact date).   Patient states that she had her last menses on Dec 31, 2016 which was very light. Patient had two migraines during that menses. Reports having migraines with menses, but frequency has increased. Patient states she just started bleeding again on 12/26/16 and it is dark brown in color. Reports bleeding is very light. Denies any pain, heavy bleeding, SOB, dizziness, or weakness.   Most of menses was old blood initially but got more like a cycle last 3 days.  Now all bleeding has stopped.  The HA's are maybe a little better today.  She also thinks she may have a sinus infection and has apt to see PCP on Monday.  No recent problems or stressors.  Her boyfriend now does not live with her and she is much better off emotionally. The only stressors has been a maternal aunt who died in 01-01-2023 of breast cancer.  GYNECOLOGIC HISTORY: Patient's last menstrual period was 12/26/2016 (exact date). Contraception:BTL Menopausal hormone therapy: N/A UPT - negative        OB History    Gravida Para Term Preterm AB Living   0 0 1   SAB TAB Ectopic Multiple Live Births   0 0 0 0 1         Patient Active Problem List   Diagnosis Date Noted  . GERD (gastroesophageal reflux disease) 04/08/2016  . Precordial pain   . Abnormal stress test   . Vitamin D deficiency 05/23/2015  . Depression 05/23/2015  . Acute infection of nasal sinus 04/25/2015  . Bilateral leg pain 11/13/2014  . Smoker 05/02/2014  . LGSIL (low grade squamous intraepithelial dysplasia) 05/02/2014  . VIN I (vulvar intraepithelial neoplasia I) 05/02/2014  . Chest pain 11/30/2013  . Abnormal EKG 11/30/2013  . Palpitations   . MVP (mitral valve prolapse)   . Dysuria 06/09/2013    Past Medical History:  Diagnosis Date  . Allergy   . Anxiety   . Arthritis   . Depression   . GERD (gastroesophageal  reflux disease) 04/08/2016  . History of abnormal cervical Pap smear 2006, 2015   2015 Neg with + HR HPV, 09/2014 ASCUS with + HR HPV, colpo with LEEP 11/2014-LGSIL (CIN-I)  . Hyperlipidemia   . Hypertension 05/2013  . Intestinal malrotation   . MVP (mitral valve prolapse)    echo in 2012 did not show MVP but did have mild elongation of the AMVL  . Palpitations   . Pyelonephritis 03/2015   evaluated by urology (Dr. Vernie Ammons)  . Recurrent UTI   . Tachycardia     Past Surgical History:  Procedure Laterality Date  . APPENDECTOMY  1999  . CARDIAC CATHETERIZATION N/A 04/01/2016   Procedure: Left Heart Cath and Coronary Angiography;  Surgeon: Kathleene Hazel, MD;  Location: [facility] INVASIVE CV LAB;  Service: Cardiovascular;  Laterality: N/A;  . CERVICAL BIOPSY  W/ LOOP ELECTRODE EXCISION  11/28/14   LGSIL with negative margins  . COLPOSCOPY  2006, 2015   2006 LGSIL, normal since; 2015 LGSIL w/LEEP  . EXCISION MORTON'S NEUROMA Bilateral 2002  . HEMORRHOID SURGERY    . INTESTINAL MALROTATION REPAIR  1999  . NASAL SINUS SURGERY    . TUBAL LIGATION      Current Outpatient Prescriptions  Medication Sig Dispense Refill  . Cholecalciferol (VITAMIN D) 2000 units CAPS Take 1  capsule by mouth daily.    Marland Kitchen ibuprofen (ADVIL,MOTRIN) 200 MG tablet Take 200-800 mg by mouth every 6 (six) hours as needed for headache.    . ondansetron (ZOFRAN) 4 MG tablet Take 1 tablet (4 mg total) by mouth every 8 (eight) hours as needed for nausea or vomiting. 20 tablet 0   No current facility-administered medications for this visit.      ALLERGIES: Imitrex [sumatriptan]; Iohexol; Prozac [fluoxetine hcl]; Penicillins; Sulfa antibiotics; and Zoloft [sertraline hcl]  Family History  Problem Relation Age of Onset  . Heart disease Mother   . Hypertension Mother   . Aneurysm Father 36    brain   . Hypertension Sister   . Heart disease Maternal Grandmother   . Cancer Paternal Grandmother   . Cancer Paternal  Grandfather   . Breast cancer Maternal Aunt     Social History   Social History  . Marital status: Single    Spouse name: N/A  . Number of children: N/A  . Years of education: N/A   Occupational History  . Not on file.   Social History Main Topics  . Smoking status: Current Some Day Smoker  . Smokeless tobacco: Never Used     Comment: quit 1 year ago, sep 2013  . Alcohol use No     Comment: occasionally  . Drug use: No  . Sexual activity: Not Currently    Birth control/ protection: Surgical     Comment: BTL   Other Topics Concern  . Not on file   Social History Narrative  . No narrative on file    Review of Systems  Reason unable to perform ROS: GYN: menstrual cycle changes, unscheduled bleeding or spotting.  HENT:       Headache  Gastrointestinal: Positive for nausea and vomiting.    PHYSICAL EXAMINATION:    BP 118/80 (BP Location: Right Arm, Patient Position: Sitting, Cuff Size: Normal)   Pulse 60   Ht 5' 8.25" (1.734 m)   Wt 174 lb (78.9 kg)   LMP 12/26/2016 (Exact Date) Comment: previous on 12/13/16  BMI 26.26 kg/m     General appearance: alert, cooperative and appears stated age Neck: no adenopathy, supple, thyroid is normal Abdomen: soft, non-tender; no masses,  no organomegaly  Pelvic: External genitalia:  no lesions              Urethra:  normal appearing urethra with no masses, tenderness or lesions              Bartholin's and Skene's: normal                 Vagina: normal appearing vagina with normal color and discharge, no lesions              Cervix: anteverted and no lesions              Bimanual Exam:  Uterus:  normal size, contour, position, consistency, mobility, non-tender              Adnexa: no mass, fullness, tenderness             .           Chaperone was present for exam.  ASSESSMENT:   AUB ? Etiology  Perimenopausal  History of HA's with cycles - no diagnosis of migraine  FMH of breast cancer     PLAN:  Will have pt to  keep menses calendar  Since no bleeding today did not give  her Provera  Call back if AUB persist and will do PUS  Pt to see PCP on Monday about sinus problems as the cause of HA's  Request genetic referral for breast cancer.    An After Visit Summary was printed and given to the patient.

## 2017-01-01 NOTE — Patient Instructions (Signed)

## 2017-01-03 NOTE — Progress Notes (Signed)
Encounter reviewed by Dr. Fannie Gathright Amundson C. Silva.  

## 2017-01-04 ENCOUNTER — Encounter: Payer: Self-pay | Admitting: Nurse Practitioner

## 2017-01-04 DIAGNOSIS — R5382 Chronic fatigue, unspecified: Secondary | ICD-10-CM | POA: Diagnosis not present

## 2017-01-04 DIAGNOSIS — E611 Iron deficiency: Secondary | ICD-10-CM | POA: Diagnosis not present

## 2017-01-04 DIAGNOSIS — E559 Vitamin D deficiency, unspecified: Secondary | ICD-10-CM | POA: Diagnosis not present

## 2017-01-04 DIAGNOSIS — R51 Headache: Secondary | ICD-10-CM | POA: Diagnosis not present

## 2017-01-04 MED FILL — AZITHROMYCIN 250 MG TABLET: 250 | 5 days supply | Qty: 6 | Fill #0

## 2017-01-04 MED FILL — BUTALB-ACETAMIN-CAFF 50-325: 50-325-40 | 30 days supply | Qty: 15 | Fill #0

## 2017-01-05 ENCOUNTER — Encounter: Payer: Self-pay | Admitting: Nurse Practitioner

## 2017-01-06 MED FILL — VIT D2 1.25 MG (50,000 UNIT: 1.25 MG | 56 days supply | Qty: 8 | Fill #0

## 2017-01-22 ENCOUNTER — Encounter: Payer: Self-pay | Admitting: Podiatry

## 2017-01-22 ENCOUNTER — Ambulatory Visit (INDEPENDENT_AMBULATORY_CARE_PROVIDER_SITE_OTHER): Payer: 59 | Admitting: Podiatry

## 2017-01-22 DIAGNOSIS — M722 Plantar fascial fibromatosis: Secondary | ICD-10-CM

## 2017-01-27 NOTE — Progress Notes (Signed)
Subjective: Victoria Barr presents to the office today for follow-up evaluation of bilateral heel pain. She states that she is doing better but still having some pain.  They have been icing, stretching, try to wear supportive shoe as much as possible. No other complaints at this time. No acute changes since last appointment. They deny any systemic complaints such as fevers, chills, nausea, vomiting.  Objective: General: AAO x3, NAD  Dermatological: Skin is warm, dry and supple bilateral. Nails x 10 are well manicured; remaining integument appears unremarkable at this time. There are no open sores, no preulcerative lesions, no rash or signs of infection present.  Vascular: Dorsalis Pedis artery and Posterior Tibial artery pedal pulses are 2/4 bilateral with immedate capillary fill time. Pedal hair growth present. There is no pain with calf compression, swelling, warmth, erythema.   Neruologic: Grossly intact via light touch bilateral. Vibratory intact via tuning fork bilateral. Protective threshold with Semmes Wienstein monofilament intact to all pedal sites bilateral.   Musculoskeletal: There is much improved yet mild continued tenderness palpation along the plantar medial tubercle of the calcaneus at the insertion of the plantar fascia on the  Left >> right foot. There is no pain along the course of the plantar fascia within the arch of the foot. Plantar fascia appears to be intact bilaterally. There is no pain with lateral compression of the calcaneus and there is no pain with vibratory sensation. There is no pain along the course or insertion of the Achilles tendon. There are no other areas of tenderness to bilateral lower extremities. No gross boney pedal deformities bilateral. No pain, crepitus, or limitation noted with foot and ankle range of motion bilateral. Muscular strength 5/5 in all groups tested bilateral.  Gait: Unassisted, Nonantalgic.   Assessment: Presents for follow-up  evaluation for heel pain, likely plantar fasciitis   Plan: -Treatment options discussed including all alternatives, risks, and complications -Discussed another steroid injection today.  -Physical therapy discussed -Continue supportive shoe gear/ discussed orthotics. -Follow-up in 6 weeks or sooner if any problems arise. In the meantime, encouraged to call the office with any questions, concerns, change in symptoms.   Ovid CurdMatthew Marshaun Lortie, DPM

## 2017-02-04 DIAGNOSIS — H52223 Regular astigmatism, bilateral: Secondary | ICD-10-CM | POA: Diagnosis not present

## 2017-02-04 DIAGNOSIS — H524 Presbyopia: Secondary | ICD-10-CM | POA: Diagnosis not present

## 2017-02-04 DIAGNOSIS — H5203 Hypermetropia, bilateral: Secondary | ICD-10-CM | POA: Diagnosis not present

## 2017-02-09 ENCOUNTER — Telehealth: Payer: 59 | Admitting: Family

## 2017-02-09 DIAGNOSIS — A499 Bacterial infection, unspecified: Secondary | ICD-10-CM | POA: Diagnosis not present

## 2017-02-09 DIAGNOSIS — N390 Urinary tract infection, site not specified: Secondary | ICD-10-CM | POA: Diagnosis not present

## 2017-02-09 MED ORDER — CIPROFLOXACIN HCL 500 MG PO TABS: 500.0000 mg | ORAL_TABLET | Freq: Two times a day (BID) | ORAL | 0 refills | Status: DC

## 2017-02-09 MED FILL — CIPROFLOXACIN HCL 500 MG TA: 500 | 5 days supply | Qty: 10 | Fill #0

## 2017-02-09 NOTE — Progress Notes (Signed)

## 2017-03-31 DIAGNOSIS — L659 Nonscarring hair loss, unspecified: Secondary | ICD-10-CM | POA: Diagnosis not present

## 2017-03-31 DIAGNOSIS — R5382 Chronic fatigue, unspecified: Secondary | ICD-10-CM | POA: Diagnosis not present

## 2017-03-31 DIAGNOSIS — N951 Menopausal and female climacteric states: Secondary | ICD-10-CM | POA: Diagnosis not present

## 2017-03-31 DIAGNOSIS — E611 Iron deficiency: Secondary | ICD-10-CM | POA: Diagnosis not present

## 2017-03-31 DIAGNOSIS — E559 Vitamin D deficiency, unspecified: Secondary | ICD-10-CM | POA: Diagnosis not present

## 2017-04-12 ENCOUNTER — Telehealth: Payer: Self-pay | Admitting: Obstetrics and Gynecology

## 2017-04-12 NOTE — Telephone Encounter (Signed)
Left patient a message to call back to reschedule a future appointment that was cancelled by the provider for [facility]/[place]

## 2017-04-20 DIAGNOSIS — S8002XA Contusion of left knee, initial encounter: Secondary | ICD-10-CM | POA: Diagnosis not present

## 2017-05-02 DIAGNOSIS — J029 Acute pharyngitis, unspecified: Secondary | ICD-10-CM | POA: Diagnosis not present

## 2017-05-06 ENCOUNTER — Telehealth: Payer: 59 | Admitting: Physician Assistant

## 2017-05-06 DIAGNOSIS — B9689 Other specified bacterial agents as the cause of diseases classified elsewhere: Secondary | ICD-10-CM

## 2017-05-06 DIAGNOSIS — J019 Acute sinusitis, unspecified: Secondary | ICD-10-CM

## 2017-05-06 MED ORDER — DOXYCYCLINE HYCLATE 100 MG PO CAPS: 100.0000 mg | ORAL_CAPSULE | Freq: Two times a day (BID) | ORAL | 0 refills | Status: DC

## 2017-05-06 NOTE — Progress Notes (Signed)

## 2017-05-27 MED FILL — HYDROCORTISON-ACETIC ACID S: 1-2 | 7 days supply | Qty: 10 | Fill #0

## 2017-06-29 ENCOUNTER — Other Ambulatory Visit: Payer: Self-pay | Admitting: Cardiovascular Disease

## 2017-06-29 DIAGNOSIS — R0600 Dyspnea, unspecified: Secondary | ICD-10-CM

## 2017-06-29 DIAGNOSIS — R0609 Other forms of dyspnea: Secondary | ICD-10-CM

## 2017-07-02 ENCOUNTER — Ambulatory Visit (HOSPITAL_COMMUNITY): Payer: 59

## 2017-07-12 ENCOUNTER — Ambulatory Visit (INDEPENDENT_AMBULATORY_CARE_PROVIDER_SITE_OTHER): Payer: 59 | Admitting: Obstetrics and Gynecology

## 2017-07-12 ENCOUNTER — Ambulatory Visit: Payer: 59 | Admitting: Nurse Practitioner

## 2017-07-12 ENCOUNTER — Encounter: Payer: Self-pay | Admitting: Obstetrics and Gynecology

## 2017-07-12 VITALS — BP 110/60 | HR 80 | Resp 14 | Ht 68.5 in | Wt 175.0 lb

## 2017-07-12 DIAGNOSIS — N946 Dysmenorrhea, unspecified: Secondary | ICD-10-CM

## 2017-07-12 DIAGNOSIS — F17200 Nicotine dependence, unspecified, uncomplicated: Secondary | ICD-10-CM | POA: Diagnosis not present

## 2017-07-12 DIAGNOSIS — R350 Frequency of micturition: Secondary | ICD-10-CM | POA: Diagnosis not present

## 2017-07-12 DIAGNOSIS — R3915 Urgency of urination: Secondary | ICD-10-CM | POA: Diagnosis not present

## 2017-07-12 DIAGNOSIS — N3090 Cystitis, unspecified without hematuria: Secondary | ICD-10-CM

## 2017-07-12 DIAGNOSIS — N939 Abnormal uterine and vaginal bleeding, unspecified: Secondary | ICD-10-CM | POA: Diagnosis not present

## 2017-07-12 DIAGNOSIS — Z124 Encounter for screening for malignant neoplasm of cervix: Secondary | ICD-10-CM

## 2017-07-12 DIAGNOSIS — Z01419 Encounter for gynecological examination (general) (routine) without abnormal findings: Secondary | ICD-10-CM | POA: Diagnosis not present

## 2017-07-12 LAB — POCT URINALYSIS DIPSTICK
Bilirubin, UA: NEGATIVE
Clarity, UA: NEGATIVE
Glucose, UA: NEGATIVE
Ketones, UA: NEGATIVE
Nitrite, UA: NEGATIVE
Protein, UA: NEGATIVE
Urobilinogen, UA: NEGATIVE E.U./dL — AB
pH, UA: 6 (ref 5.0–8.0)

## 2017-07-12 MED ORDER — PHENAZOPYRIDINE HCL 200 MG PO TABS: 200.0000 mg | ORAL_TABLET | Freq: Three times a day (TID) | ORAL | 0 refills | Status: DC | PRN

## 2017-07-12 MED ORDER — IBUPROFEN 800 MG PO TABS: 800.0000 mg | ORAL_TABLET | Freq: Three times a day (TID) | ORAL | 1 refills | Status: DC | PRN

## 2017-07-12 MED ORDER — NITROFURANTOIN MONOHYD MACRO 100 MG PO CAPS: 100.0000 mg | ORAL_CAPSULE | Freq: Two times a day (BID) | ORAL | 0 refills | Status: DC

## 2017-07-12 MED FILL — NITROFURANTOIN MONO-MCR 100: 100 | 5 days supply | Qty: 10 | Fill #0

## 2017-07-12 MED FILL — IBUPROFEN 800 MG TABS: 800 | 10 days supply | Qty: 30 | Fill #0

## 2017-07-12 MED FILL — PHENAZOPYRIDINE 200 MG TAB: 200 | 2 days supply | Qty: 6 | Fill #0

## 2017-07-12 NOTE — Patient Instructions (Signed)
EXERCISE AND DIET:  We recommended that you start or continue a regular exercise program for good health. Regular exercise means any activity that makes your heart beat faster and makes you sweat.  We recommend exercising at least 30 minutes per day at least 3 days a week, preferably 4 or 5.  We also recommend a diet low in fat and sugar.  Inactivity, poor dietary choices and obesity can cause diabetes, heart attack, stroke, and kidney damage, among others.    ALCOHOL AND SMOKING:  Women should limit their alcohol intake to no more than 7 drinks/beers/glasses of wine (combined, not each!) per week. Moderation of alcohol intake to this level decreases your risk of breast cancer and liver damage. And of course, no recreational drugs are part of a healthy lifestyle.  And absolutely no smoking or even second hand smoke. Most people know smoking can cause heart and lung diseases, but did you know it also contributes to weakening of your bones? Aging of your skin?  Yellowing of your teeth and nails?  CALCIUM AND VITAMIN D:  Adequate intake of calcium and Vitamin D are recommended.  The recommendations for exact amounts of these supplements seem to change often, but generally speaking 600 mg of calcium (either carbonate or citrate) and 800 units of Vitamin D per day seems prudent. Certain women may benefit from higher intake of Vitamin D.  If you are among these women, your doctor will have told you during your visit.    PAP SMEARS:  Pap smears, to check for cervical cancer or precancers,  have traditionally been done yearly, although recent scientific advances have shown that most women can have pap smears less often.  However, every woman still should have a physical exam from her gynecologist every year. It will include a breast check, inspection of the vulva and vagina to check for abnormal growths or skin changes, a visual exam of the cervix, and then an exam to evaluate the size and shape of the uterus and  ovaries.  And after 44 years of age, a rectal exam is indicated to check for rectal cancers. We will also provide age appropriate advice regarding health maintenance, like when you should have certain vaccines, screening for sexually transmitted diseases, bone density testing, colonoscopy, mammograms, etc.   MAMMOGRAMS:  All women over 44 years old should have a yearly mammogram. Many facilities now offer a "3D" mammogram, which may cost around $50 extra out of pocket. If possible,  we recommend you accept the option to have the 3D mammogram performed.  It both reduces the number of women who will be called back for extra views which then turn out to be normal, and it is better than the routine mammogram at detecting truly abnormal areas.    COLONOSCOPY:  Colonoscopy to screen for colon cancer is recommended for all women at age 44.  We know, you hate the idea of the prep.  We agree, BUT, having colon cancer and not knowing it is worse!!  Colon cancer so often starts as a polyp that can be seen and removed at colonscopy, which can quite literally save your life!  And if your first colonoscopy is normal and you have no family history of colon cancer, most women don't have to have it again for 10 years.  Once every ten years, you can do something that may end up saving your life, right?  We will be happy to help you get it scheduled when you are ready.    Be sure to check your insurance coverage so you understand how much it will cost.  It may be covered as a preventative service at no cost, but you should check your particular policy.      Breast Self-Awareness Breast self-awareness means being familiar with how your breasts look and feel. It involves checking your breasts regularly and reporting any changes to your health care provider. Practicing breast self-awareness is important. A change in your breasts can be a sign of a serious medical problem. Being familiar with how your breasts look and feel allows  you to find any problems early, when treatment is more likely to be successful. All women should practice breast self-awareness, including women who have had breast implants. How to do a breast self-exam One way to learn what is normal for your breasts and whether your breasts are changing is to do a breast self-exam. To do a breast self-exam: Look for Changes  1. Remove all the clothing above your waist. 2. Stand in front of a mirror in a room with good lighting. 3. Put your hands on your hips. 4. Push your hands firmly downward. 5. Compare your breasts in the mirror. Look for differences between them (asymmetry), such as: ? Differences in shape. ? Differences in size. ? Puckers, dips, and bumps in one breast and not the other. 6. Look at each breast for changes in your skin, such as: ? Redness. ? Scaly areas. 7. Look for changes in your nipples, such as: ? Discharge. ? Bleeding. ? Dimpling. ? Redness. ? A change in position. Feel for Changes  Carefully feel your breasts for lumps and changes. It is best to do this while lying on your back on the floor and again while sitting or standing in the shower or tub with soapy water on your skin. Feel each breast in the following way:  Place the arm on the side of the breast you are examining above your head.  Feel your breast with the other hand.  Start in the nipple area and make  inch (2 cm) overlapping circles to feel your breast. Use the pads of your three middle fingers to do this. Apply light pressure, then medium pressure, then firm pressure. The light pressure will allow you to feel the tissue closest to the skin. The medium pressure will allow you to feel the tissue that is a little deeper. The firm pressure will allow you to feel the tissue close to the ribs.  Continue the overlapping circles, moving downward over the breast until you feel your ribs below your breast.  Move one finger-width toward the center of the body.  Continue to use the  inch (2 cm) overlapping circles to feel your breast as you move slowly up toward your collarbone.  Continue the up and down exam using all three pressures until you reach your armpit.  Write Down What You Find  Write down what is normal for each breast and any changes that you find. Keep a written record with breast changes or normal findings for each breast. By writing this information down, you do not need to depend only on memory for size, tenderness, or location. Write down where you are in your menstrual cycle, if you are still menstruating. If you are having trouble noticing differences in your breasts, do not get discouraged. With time you will become more familiar with the variations in your breasts and more comfortable with the exam. How often should I examine my breasts? Examine   your breasts every month. If you are breastfeeding, the best time to examine your breasts is after a feeding or after using a breast pump. If you menstruate, the best time to examine your breasts is 5-7 days after your period is over. During your period, your breasts are lumpier, and it may be more difficult to notice changes. When should I see my health care provider? See your health care provider if you notice:  A change in shape or size of your breasts or nipples.  A change in the skin of your breast or nipples, such as a reddened or scaly area.  Unusual discharge from your nipples.  A lump or thick area that was not there before.  Pain in your breasts.  Anything that concerns you.  This information is not intended to replace advice given to you by your health care provider. Make sure you discuss any questions you have with your health care provider. Document Released: 09/07/2005 Document Revised: 02/13/2016 Document Reviewed: 07/28/2015 Elsevier Interactive Patient Education  2018 [employer].  Urinary Tract Infection, Adult A urinary tract infection (UTI) is an infection of  any part of the urinary tract, which includes the kidneys, ureters, bladder, and urethra. These organs make, store, and get rid of urine in the body. UTI can be a bladder infection (cystitis) or kidney infection (pyelonephritis). What are the causes? This infection may be caused by fungi, viruses, or bacteria. Bacteria are the most common cause of UTIs. This condition can also be caused by repeated incomplete emptying of the bladder during urination. What increases the risk? This condition is more likely to develop if:  You ignore your need to urinate or hold urine for long periods of time.  You do not empty your bladder completely during urination.  You wipe back to front after urinating or having a bowel movement, if you are female.  You are uncircumcised, if you are female.  You are constipated.  You have a urinary catheter that stays in place (indwelling).  You have a weak defense (immune) system.  You have a medical condition that affects your bowels, kidneys, or bladder.  You have diabetes.  You take antibiotic medicines frequently or for long periods of time, and the antibiotics no longer work well against certain types of infections (antibiotic resistance).  You take medicines that irritate your urinary tract.  You are exposed to chemicals that irritate your urinary tract.  You are female.  What are the signs or symptoms? Symptoms of this condition include:  Fever.  Frequent urination or passing small amounts of urine frequently.  Needing to urinate urgently.  Pain or burning with urination.  Urine that smells bad or unusual.  Cloudy urine.  Pain in the lower abdomen or back.  Trouble urinating.  Blood in the urine.  Vomiting or being less hungry than normal.  Diarrhea or abdominal pain.  Vaginal discharge, if you are female.  How is this diagnosed? This condition is diagnosed with a medical history and physical exam. You will also need to provide a  urine sample to test your urine. Other tests may be done, including:  Blood tests.  Sexually transmitted disease (STD) testing.  If you have had more than one UTI, a cystoscopy or imaging studies may be done to determine the cause of the infections. How is this treated? Treatment for this condition often includes a combination of two or more of the following:  Antibiotic medicine.  Other medicines to treat less common causes  of UTI.  Over-the-counter medicines to treat pain.  Drinking enough water to stay hydrated.  Follow these instructions at home:  Take over-the-counter and prescription medicines only as told by your health care provider.  If you were prescribed an antibiotic, take it as told by your health care provider. Do not stop taking the antibiotic even if you start to feel better.  Avoid alcohol, caffeine, tea, and carbonated beverages. They can irritate your bladder.  Drink enough fluid to keep your urine clear or pale yellow.  Keep all follow-up visits as told by your health care provider. This is important.  Make sure to: ? Empty your bladder often and completely. Do not hold urine for long periods of time. ? Empty your bladder before and after sex. ? Wipe from front to back after a bowel movement if you are female. Use each tissue one time when you wipe. Contact a health care provider if:  You have back pain.  You have a fever.  You feel nauseous or vomit.  Your symptoms do not get better after 3 days.  Your symptoms go away and then return. Get help right away if:  You have severe back pain or lower abdominal pain.  You are vomiting and cannot keep down any medicines or water. This information is not intended to replace advice given to you by your health care provider. Make sure you discuss any questions you have with your health care provider. Document Released: 06/17/2005 Document Revised: 02/19/2016 Document Reviewed: 07/29/2015 Elsevier  Interactive Patient Education  2017 [employer]

## 2017-07-12 NOTE — Progress Notes (Signed)
46 y.o. G1P1001 SingleCaucasianF here for annual exam.   She c/o 3 day h/o urine frequency, urgency, dysuria, SP pressure. No fevers, no flank pain.  Cycles are monthly, q 20-28 days. Every other months she has some bleeding or spotting for up to 9 days. It can start and stop during her cycles. Cycles have been like this in the last year.  Occasional hot flashes, no night sweats or vaginal dryness. No dyspareunia.  Period Duration (Days): 4-9 days  Period Pattern: (!) Irregular Menstrual Flow: Moderate Menstrual Control: Tampon, Thin pad Menstrual Control Change Freq (Hours): changes pad/tampon 3-4 times a day  Dysmenorrhea: (!) Severe Dysmenorrhea Symptoms: Cramping  Cramps are horrible, worse since she had her tubes tied 4 years ago. She takes motrin which helps.  She does get terrible migraines, they can be with her cycles, but not always. No auras.  Sexually, same long term partner, don't live together any more (father of her child).   Patient's last menstrual period was 06/19/2017.          Sexually active: Yes.    The current method of family planning is tubal ligation.    Exercising: No.  The patient does not participate in regular exercise at present. Smoker:  Yes, 1/2 a pack a day.   Health Maintenance: Pap:  06-30-16 WNL NEG HR HPV 06-25-15 WNL NEG HR HPV History of abnormal Pap:  07-20-15 ASCUS + HR HPV had LEEP MMG:  08-20-16 WNL Colonoscopy:  2013 WNL per patient  BMD:   Never TDaP:  2013 Gardasil: No   reports that she has been smoking Cigarettes.  She has been smoking about 0.50 packs per day. She has never used smokeless tobacco. She reports that she does not drink alcohol or use drugs. Son is 8, goes back and forth to his Dad's house (current partner). 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   Past Medical History:  Diagnosis Date  . Allergy   . Anxiety   . Arthritis   . Depression   . GERD (gastroesophageal reflux disease) 04/08/2016  . History of abnormal cervical Pap smear  2006, 2015   2015 Neg with + HR HPV, 09/2014 ASCUS with + HR HPV, colpo with LEEP 11/2014-LGSIL ([facility])  . Hyperlipidemia   . Hypertension 05/2013  . Intestinal malrotation   . MVP (mitral valve prolapse)    echo in 2012 did not show MVP but did have mild elongation of the AMVL  . Palpitations   . Pyelonephritis 03/2015   evaluated by urology (Dr. Karsten Ro)  . Recurrent UTI   . Tachycardia     Past Surgical History:  Procedure Laterality Date  . APPENDECTOMY  1999  . CARDIAC CATHETERIZATION N/A 04/01/2016   Procedure: Left Heart Cath and Coronary Angiography;  Surgeon: Burnell Blanks, MD;  Location: [facility];  Service: Cardiovascular;  Laterality: N/A;  . CERVICAL BIOPSY  W/ LOOP ELECTRODE EXCISION  11/28/14   LGSIL with negative margins  . COLPOSCOPY  2006, 2015   2006 LGSIL, normal since; 2015 LGSIL w/LEEP  . EXCISION MORTON'S NEUROMA Bilateral 2002  . HEMORRHOID SURGERY    . INTESTINAL MALROTATION REPAIR  1999  . NASAL SINUS SURGERY    . TUBAL LIGATION    She has a FH of heart disease, Mom died at 30, MGM died at 86. She was having some pain, ? Echo, but normal cardiac cath.   Current Outpatient Prescriptions  Medication Sig Dispense Refill  . ibuprofen (ADVIL,MOTRIN) 200 MG  tablet Take 200-800 mg by mouth every 6 (six) hours as needed for headache.     No current facility-administered medications for this visit.     Family History  Problem Relation Age of Onset  . Heart disease Mother   . Hypertension Mother   . Aneurysm Father 34       brain   . Hypertension Sister   . Heart disease Maternal Grandmother   . Cancer Paternal Grandmother   . Cancer Paternal Grandfather   . Breast cancer Maternal Aunt   . BRCA 1/2 Cousin        negative    Review of Systems  Constitutional: Negative.   HENT: Positive for ear pain.        Left ear pain   Eyes: Negative.   Respiratory: Negative.   Cardiovascular: Negative.   Gastrointestinal: Negative.         Bloating   Endocrine: Negative.   Genitourinary: Positive for dysuria, frequency and urgency.  Musculoskeletal: Negative.   Skin: Negative.   Allergic/Immunologic: Negative.   Neurological: Negative.   Psychiatric/Behavioral: Negative.     Exam:   BP 110/60 (BP Location: Right Arm, Patient Position: Sitting, Cuff Size: Normal)   Pulse 80   Resp 14   Ht 5' 8.5" (1.74 m)   Wt 175 lb (79.4 kg)   LMP 06/19/2017   BMI 26.22 kg/m   Weight change: _0 @ Height:   Height: 5' 8.5" (174 cm)  Ht Readings from Last 3 Encounters:  07/12/17 5' 8.5" (1.74 m)  01/01/17 5' 8.25" (1.734 m)  06/30/16 5' 8.25" (1.734 m)    General appearance: alert, cooperative and appears stated age Head: Normocephalic, without obvious abnormality, atraumatic Neck: no adenopathy, supple, symmetrical, trachea midline and thyroid normal to inspection and palpation Lungs: clear to auscultation bilaterally Cardiovascular: regular rate and rhythm Breasts: normal appearance, no masses or tenderness Abdomen: soft, mildly tender in the SP region, non distended,  no masses,  no organomegaly Extremities: extremities normal, atraumatic, no cyanosis or edema Skin: Skin color, texture, turgor normal. No rashes or lesions Lymph nodes: Cervical, supraclavicular, and axillary nodes normal. No abnormal inguinal nodes palpated Neurologic: Grossly normal   Pelvic: External genitalia:  no lesions              Urethra:  normal appearing urethra with no masses, tenderness or lesions              Bartholins and Skenes: normal                 Vagina: normal appearing vagina with normal color and discharge, no lesions              Cervix: no cervical motion tenderness and no lesions               Bimanual Exam:  Uterus:  normal size, contour, position, consistency, mobility, non-tender              Adnexa: no mass, fullness, tenderness               Rectovaginal: Confirms               Anus:  normal sphincter tone, no  lesions  Bladder: tender to palpation  Chaperone was present for exam.  Urine dip: +++blood, small leuk, negative nitrates  A:  Well Woman with normal exam  Cystitis  Smoker, encouraged to quit  H/O HTN, normalized after a year  Irregular bleeding in the last  year  Severe dysmenorrhea  P:   Send urine for ua, C&S  Start macrobid and pyridium  Pap secondary with her irregular bleeding  Mammogram next month  Return for an ultrasound, possible endometrial biopsy  Ibuprofen for cramps  TSH, other labs with primary  Discussed breast self exam  Discussed calcium and vit D intake  Not a candidate for OCP's, discussed the option of a mirena IUD, she declines

## 2017-07-13 LAB — URINALYSIS, MICROSCOPIC ONLY: Casts: NONE SEEN /lpf

## 2017-07-13 LAB — URINE CULTURE

## 2017-07-13 LAB — TSH: TSH: 1.62 u[IU]/mL (ref 0.450–4.500)

## 2017-07-14 ENCOUNTER — Other Ambulatory Visit: Payer: Self-pay | Admitting: Obstetrics and Gynecology

## 2017-07-14 DIAGNOSIS — Z1231 Encounter for screening mammogram for malignant neoplasm of breast: Secondary | ICD-10-CM

## 2017-07-15 ENCOUNTER — Other Ambulatory Visit (HOSPITAL_COMMUNITY)
Admission: RE | Admit: 2017-07-15 | Discharge: 2017-07-15 | Disposition: A | Discharge status: Home / Self Care | Payer: 59 | Source: Ambulatory Visit | Attending: Obstetrics and Gynecology | Admitting: Obstetrics and Gynecology

## 2017-07-15 DIAGNOSIS — Z124 Encounter for screening for malignant neoplasm of cervix: Secondary | ICD-10-CM | POA: Insufficient documentation

## 2017-07-15 DIAGNOSIS — N939 Abnormal uterine and vaginal bleeding, unspecified: Secondary | ICD-10-CM | POA: Diagnosis not present

## 2017-07-15 NOTE — Addendum Note (Signed)
Addended by: Gertie ExonJERTSON, Zari Cly E on: 07/15/2017 05:00 PM   Modules accepted: Orders

## 2017-07-19 ENCOUNTER — Telehealth: Payer: Self-pay | Admitting: Obstetrics and Gynecology

## 2017-07-19 LAB — CYTOLOGY - PAP: Diagnosis: NEGATIVE

## 2017-07-19 NOTE — Telephone Encounter (Signed)
Call placed to patient to discuss rescheduling ultrasound on 07/27/17. Provider will be in surgery and we will need to reschedule. Left voicemail message requesting a return call

## 2017-07-27 ENCOUNTER — Other Ambulatory Visit: Payer: 59 | Admitting: Obstetrics and Gynecology

## 2017-07-27 ENCOUNTER — Other Ambulatory Visit: Payer: 59

## 2017-08-03 ENCOUNTER — Ambulatory Visit (INDEPENDENT_AMBULATORY_CARE_PROVIDER_SITE_OTHER): Payer: 59 | Admitting: Obstetrics and Gynecology

## 2017-08-03 ENCOUNTER — Ambulatory Visit (INDEPENDENT_AMBULATORY_CARE_PROVIDER_SITE_OTHER): Payer: 59

## 2017-08-03 ENCOUNTER — Encounter: Payer: Self-pay | Admitting: Obstetrics and Gynecology

## 2017-08-03 ENCOUNTER — Other Ambulatory Visit: Payer: Self-pay

## 2017-08-03 VITALS — BP 118/70 | HR 68 | Resp 16 | Wt 177.0 lb

## 2017-08-03 DIAGNOSIS — N939 Abnormal uterine and vaginal bleeding, unspecified: Secondary | ICD-10-CM

## 2017-08-03 DIAGNOSIS — N946 Dysmenorrhea, unspecified: Secondary | ICD-10-CM

## 2017-08-03 NOTE — Progress Notes (Signed)
GYNECOLOGY  VISIT   HPI: 46 y.o.   Single  Caucasian  female   [id number] with Patient's last menstrual period was 07/15/2017.   here for follow up irregular menstrual cycles and dysmenorrhea. The patient cycles every 20-28 days for 4-9 days at a time. She has moderate flow, but severe dysmenorrhea. H/O tubal ligation for contraception.  GYNECOLOGIC HISTORY: Patient's last menstrual period was 07/15/2017. Contraception:tubal ligation  Menopausal hormone therapy: none         OB History    Gravida Para Term Preterm AB Living   '1 1 1 '$ 0 0 1   SAB TAB Ectopic Multiple Live Births   0 0 0 0 1         Patient Active Problem List   Diagnosis Date Noted  . GERD (gastroesophageal reflux disease) 04/08/2016  . Precordial pain   . Abnormal stress test   . Vitamin D deficiency 05/23/2015  . Depression 05/23/2015  . Acute infection of nasal sinus 04/25/2015  . Bilateral leg pain 11/13/2014  . Smoker 05/02/2014  . LGSIL (low grade squamous intraepithelial dysplasia) 05/02/2014  . VIN I (vulvar intraepithelial neoplasia I) 05/02/2014  . Chest pain 11/30/2013  . Abnormal EKG 11/30/2013  . Palpitations   . MVP (mitral valve prolapse)   . Dysuria 06/09/2013    Past Medical History:  Diagnosis Date  . Allergy   . Anxiety   . Arthritis   . Depression   . GERD (gastroesophageal reflux disease) 04/08/2016  . History of abnormal cervical Pap smear 2006, 2015   2015 Neg with + HR HPV, 09/2014 ASCUS with + HR HPV, colpo with LEEP 11/2014-LGSIL (CIN-I)  . Hyperlipidemia   . Hypertension 05/2013  . Intestinal malrotation   . MVP (mitral valve prolapse)    echo in 2012 did not show MVP but did have mild elongation of the AMVL  . Palpitations   . Pyelonephritis 03/2015   evaluated by urology (Dr. Karsten Ro)  . Recurrent UTI   . Tachycardia     Past Surgical History:  Procedure Laterality Date  . APPENDECTOMY  1999  . CERVICAL BIOPSY  W/ LOOP ELECTRODE EXCISION  11/28/14   LGSIL with  negative margins  . COLPOSCOPY  2006, 2015   2006 LGSIL, normal since; 2015 LGSIL w/LEEP  . EXCISION MORTON'S NEUROMA Bilateral 2002  . HEMORRHOID SURGERY    . INTESTINAL MALROTATION REPAIR  1999  . NASAL SINUS SURGERY    . TUBAL LIGATION      Current Outpatient Medications  Medication Sig Dispense Refill  . ibuprofen (ADVIL,MOTRIN) 800 MG tablet Take 1 tablet (800 mg total) by mouth every 8 (eight) hours as needed. 30 tablet 1   No current facility-administered medications for this visit.      ALLERGIES: Imitrex [sumatriptan]; Iohexol; Prozac [fluoxetine hcl]; Penicillins; Sulfa antibiotics; and Zoloft [sertraline hcl]  Family History  Problem Relation Age of Onset  . Heart disease Mother   . Hypertension Mother   . Aneurysm Father 59       brain   . Hypertension Sister   . Heart disease Maternal Grandmother   . Cancer Paternal Grandmother   . Cancer Paternal Grandfather   . Breast cancer Maternal Aunt   . BRCA 1/2 Cousin        negative    Social History   Socioeconomic History  . Marital status: Single    Spouse name: Not on file  . Number of children: Not on file  .  Years of education: Not on file  . Highest education level: Not on file  Social Needs  . Financial resource strain: Not on file  . Food insecurity - worry: Not on file  . Food insecurity - inability: Not on file  . Transportation needs - medical: Not on file  . Transportation needs - non-medical: Not on file  Occupational History  . Not on file  Tobacco Use  . Smoking status: Current Some Day Smoker    Packs/day: 0.50    Types: Cigarettes  . Smokeless tobacco: Never Used  . Tobacco comment: quit 1 year ago, sep 2013  Substance and Sexual Activity  . Alcohol use: No    Alcohol/week: 0.0 oz    Comment: occasionally  . Drug use: No  . Sexual activity: Yes    Partners: Male    Birth control/protection: Surgical    Comment: BTL  Other Topics Concern  . Not on file  Social History Narrative   . Not on file    Review of Systems  Constitutional: Negative.   HENT: Negative.   Eyes: Negative.   Respiratory: Negative.   Cardiovascular: Negative.   Gastrointestinal: Negative.   Genitourinary: Negative.   Musculoskeletal: Negative.   Skin: Negative.   Neurological: Negative.   Endo/Heme/Allergies: Negative.   Psychiatric/Behavioral: Negative.     PHYSICAL EXAMINATION:    BP 118/70 (BP Location: Right Arm, Patient Position: Sitting, Cuff Size: Normal)   Pulse 68   Resp 16   Wt 177 lb (80.3 kg)   LMP 07/15/2017   BMI 26.52 kg/m     General appearance: alert, cooperative and appears stated age  Ultrasound images reviewed with the patient.   ASSESSMENT Abnormal uterine bleeding, normal ultrasound, normal TSH, normal pap Severe dysmenorrhea, not a candidate for OCP's (smoker)    PLAN Recommended endometrial biopsy, patient declines, discussed the potential risks of endometrial hyperplasia Discussed the mirena IUD, she declines Doesn't want a hysterectomy at this time Menstrual calendar given, she will keep track of her cycles She will return if she wants to do something different   An After Visit Summary was printed and given to the patient.  10 minutes face to face time of which over 50% was spent in counseling.

## 2017-09-02 DIAGNOSIS — R351 Nocturia: Secondary | ICD-10-CM | POA: Diagnosis not present

## 2018-03-28 MED FILL — IBUPROFEN 800 MG TAB: 800 | 10 days supply | Qty: 30 | Fill #1

## 2018-07-20 MED FILL — AZITHROMYCIN 250 MG TABLET: 250 | 5 days supply | Qty: 5 | Fill #0

## 2018-08-03 ENCOUNTER — Ambulatory Visit: Payer: Self-pay | Admitting: Obstetrics and Gynecology

## 2018-09-05 MED FILL — AZITHROMYCIN 250 MG TABLET: 250 | 5 days supply | Qty: 6 | Fill #0

## 2019-02-14 ENCOUNTER — Other Ambulatory Visit: Payer: Self-pay

## 2019-02-14 ENCOUNTER — Ambulatory Visit: Payer: Self-pay | Admitting: Cardiology

## 2019-02-14 DIAGNOSIS — K219 Gastro-esophageal reflux disease without esophagitis: Secondary | ICD-10-CM

## 2019-02-14 NOTE — Progress Notes (Signed)
EKG 02/14/2019: Normal sinus rhythm at rate of 66 bpm, normal axis.  No evidence of ischemia, normal EKG.    Coronary Angiography 04/01/2016: 1. Luminal irregularities in the proximal RCA and proximal Circumflex (10% stenosis).  2. Non-cardiac chest pain  She has been having chest burning sensation and also having more belching or the past few days, due to chest pain which suggests GERD, but to exclude any angina, with Shirlyn me that she has a strong family history of premature coronary artery disease I performed a EKG which is essentially normal.  No examination was performed.  She'll continue with PPI on a daily basis and if no improvement in symptoms, she should contact her PCP.

## 2019-03-14 ENCOUNTER — Other Ambulatory Visit: Payer: Self-pay

## 2019-03-14 ENCOUNTER — Emergency Department (HOSPITAL_COMMUNITY)
Admission: EM | Admit: 2019-03-14 | Discharge: 2019-03-14 | Disposition: A | Discharge status: Home / Self Care | Payer: Self-pay | Attending: Emergency Medicine | Admitting: Emergency Medicine

## 2019-03-14 DIAGNOSIS — N10 Acute tubulo-interstitial nephritis: Secondary | ICD-10-CM | POA: Insufficient documentation

## 2019-03-14 DIAGNOSIS — N12 Tubulo-interstitial nephritis, not specified as acute or chronic: Secondary | ICD-10-CM

## 2019-03-14 DIAGNOSIS — F17210 Nicotine dependence, cigarettes, uncomplicated: Secondary | ICD-10-CM | POA: Insufficient documentation

## 2019-03-14 DIAGNOSIS — I10 Essential (primary) hypertension: Secondary | ICD-10-CM | POA: Insufficient documentation

## 2019-03-14 LAB — URINALYSIS, ROUTINE W REFLEX MICROSCOPIC
Bilirubin Urine: NEGATIVE
Glucose, UA: NEGATIVE mg/dL
Hgb urine dipstick: NEGATIVE
Ketones, ur: NEGATIVE mg/dL
Leukocytes,Ua: NEGATIVE
Nitrite: POSITIVE — AB
Protein, ur: NEGATIVE mg/dL
Specific Gravity, Urine: 1.013 (ref 1.005–1.030)
pH: 5 (ref 5.0–8.0)

## 2019-03-14 LAB — CBC WITH DIFFERENTIAL/PLATELET
Abs Immature Granulocytes: 0.03 10*3/uL (ref 0.00–0.07)
Basophils Absolute: 0 10*3/uL (ref 0.0–0.1)
Basophils Relative: 0 %
Eosinophils Absolute: 0.1 10*3/uL (ref 0.0–0.5)
Eosinophils Relative: 2 %
HCT: 42.9 % (ref 36.0–46.0)
Hemoglobin: 14.2 g/dL (ref 12.0–15.0)
Immature Granulocytes: 0 %
Lymphocytes Relative: 14 %
Lymphs Abs: 1.2 10*3/uL (ref 0.7–4.0)
MCH: 30.3 pg (ref 26.0–34.0)
MCHC: 33.1 g/dL (ref 30.0–36.0)
MCV: 91.7 fL (ref 80.0–100.0)
Monocytes Absolute: 0.6 10*3/uL (ref 0.1–1.0)
Monocytes Relative: 6 %
Neutro Abs: 6.8 10*3/uL (ref 1.7–7.7)
Neutrophils Relative %: 78 %
Platelets: 275 10*3/uL (ref 150–400)
RBC: 4.68 MIL/uL (ref 3.87–5.11)
RDW: 12.9 % (ref 11.5–15.5)
WBC: 8.7 10*3/uL (ref 4.0–10.5)
nRBC: 0 % (ref 0.0–0.2)

## 2019-03-14 LAB — COMPREHENSIVE METABOLIC PANEL
ALT: 12 U/L (ref 0–44)
AST: 16 U/L (ref 15–41)
Albumin: 3.8 g/dL (ref 3.5–5.0)
Alkaline Phosphatase: 66 U/L (ref 38–126)
Anion gap: 10 (ref 5–15)
BUN: 13 mg/dL (ref 6–20)
CO2: 23 mmol/L (ref 22–32)
Calcium: 9.3 mg/dL (ref 8.9–10.3)
Chloride: 106 mmol/L (ref 98–111)
Creatinine, Ser: 1.25 mg/dL — ABNORMAL HIGH (ref 0.44–1.00)
GFR calc Af Amer: >60 mL/min (ref >60)
GFR calc non Af Amer: 52 mL/min — ABNORMAL LOW (ref >60)
Glucose, Bld: 111 mg/dL — ABNORMAL HIGH (ref 70–99)
Potassium: 4.3 mmol/L (ref 3.5–5.1)
Sodium: 139 mmol/L (ref 135–145)
Total Bilirubin: 0.3 mg/dL (ref 0.3–1.2)
Total Protein: 6.6 g/dL (ref 6.5–8.1)

## 2019-03-14 LAB — LIPASE, BLOOD: Lipase: 35 U/L (ref 11–51)

## 2019-03-14 LAB — POC URINE PREG, ED: Preg Test, Ur: NEGATIVE

## 2019-03-14 MED ORDER — FLUCONAZOLE 150 MG PO TABS: 150.0000 mg | ORAL_TABLET | Freq: Every day | ORAL | 0 refills | Status: AC

## 2019-03-14 MED ORDER — SODIUM CHLORIDE 0.9 % IV SOLN
1.0000 g | Freq: Once | INTRAVENOUS | Status: AC
  Administered 2019-03-14: 1 g via INTRAVENOUS
  Filled 2019-03-14: qty 10

## 2019-03-14 MED ORDER — CEPHALEXIN 500 MG PO CAPS: 500.0000 mg | ORAL_CAPSULE | Freq: Four times a day (QID) | ORAL | 0 refills | Status: DC

## 2019-03-14 MED ORDER — ONDANSETRON HCL 4 MG PO TABS: 4.0000 mg | ORAL_TABLET | Freq: Four times a day (QID) | ORAL | 0 refills | Status: DC

## 2019-03-14 MED ORDER — ONDANSETRON HCL 4 MG/2ML IJ SOLN
4.0000 mg | Freq: Once | INTRAMUSCULAR | Status: AC
  Administered 2019-03-14: 4 mg via INTRAVENOUS
  Filled 2019-03-14: qty 2

## 2019-03-14 NOTE — ED Triage Notes (Signed)
Pt. Stated, Im being treated for UTI since Sunday and given antibiotic taken 3 doses. Pt. Here and no better.

## 2019-03-14 NOTE — ED Provider Notes (Signed)
[facility] EMERGENCY DEPARTMENT Provider Note   CSN: [id number] Arrival date & time: 03/14/19  1321   History   Chief Complaint Chief Complaint  Patient presents with  . Abdominal Pain  . Back Pain    HPI Victoria Barr is a 47 y.o. female.     HPI    47 YOF presents today with complaints of UTI. She notes that symptoms symptoms started approximately 4 days ago with suprapubic pain burning with urination and bilateral flank pain.  Patient notes she feels ill but denies any vomiting, she notes she is nauseous and has a decreased appetite.  She denies any vaginal discharge or bleeding.  She notes significant history of urinary tract infections in the past during this feels similar.  She was seen 2 days ago and diagnosed with urinary tract infection.  Given her significant allergy list she was placed on Cipro.  She notes she has taken 3 doses of this, she continues to have symptoms.  She notes she also used AZO.  She denies any fever.  She is not pregnant or breast-feeding.    Past Medical History:  Diagnosis Date  . Allergy   . Anxiety   . Arthritis   . Depression   . GERD (gastroesophageal reflux disease) 04/08/2016  . History of abnormal cervical Pap smear 2006, 2015   2015 Neg with + HR HPV, 09/2014 ASCUS with + HR HPV, colpo with LEEP 11/2014-LGSIL (CIN-I)  . Hyperlipidemia   . Hypertension 05/2013  . Intestinal malrotation   . MVP (mitral valve prolapse)    echo in 2012 did not show MVP but did have mild elongation of the AMVL  . Palpitations   . Pyelonephritis 03/2015   evaluated by urology (Dr. Ottelin)  . Recurrent UTI   . Tachycardia     Patient Active Problem List   Diagnosis Date Noted  . GERD (gastroesophageal reflux disease) 04/08/2016  . Precordial pain   . Abnormal stress test   . Vitamin D deficiency 05/23/2015  . Depression 05/23/2015  . Acute infection of nasal sinus 04/25/2015  . Bilateral leg pain 11/13/2014  . Smoker  05/02/2014  . LGSIL (low grade squamous intraepithelial dysplasia) 05/02/2014  . VIN I (vulvar intraepithelial neoplasia I) 05/02/2014  . Chest pain 11/30/2013  . Abnormal EKG 11/30/2013  . Palpitations   . MVP (mitral valve prolapse)   . Dysuria 06/09/2013    Past Surgical History:  Procedure Laterality Date  . APPENDECTOMY  1999  . CARDIAC CATHETERIZATION N/A 04/01/2016   Procedure: Left Heart Cath and Coronary Angiography;  Surgeon: Christopher D McAlhany, MD;  Location: [facility] INVASIVE CV LAB;  Service: Cardiovascular;  Laterality: N/A;  . CERVICAL BIOPSY  W/ LOOP ELECTRODE EXCISION  11/28/14   LGSIL with negative margins  . COLPOSCOPY  2006, 2015   2006 LGSIL, normal since; 2015 LGSIL w/LEEP  . EXCISION MORTON'S NEUROMA Bilateral 2002  . HEMORRHOID SURGERY    . INTESTINAL MALROTATION REPAIR  1999  . NASAL SINUS SURGERY    . TUBAL LIGATION       OB History    Gravida  1   Para  1   Term  1   Preterm  0   AB  0   Living  1     SAB  0   TAB  0   Ectopic  0   Multiple  0   Live Births  1              Home Medications    Prior to Admission medications   Medication Sig Start Date End Date Taking? Authorizing Provider  cephALEXin (KEFLEX) 500 MG capsule Take 1 capsule (500 mg total) by mouth 4 (four) times daily. 03/14/19   Hedges, Jeffrey, PA-C  ibuprofen (ADVIL,MOTRIN) 800 MG tablet Take 1 tablet (800 mg total) by mouth every 8 (eight) hours as needed. 07/12/17   Jertson, Jill Evelyn, MD  ondansetron (ZOFRAN) 4 MG tablet Take 1 tablet (4 mg total) by mouth every 6 (six) hours. 03/14/19   Hedges, Jeffrey, PA-C    Family History Family History  Problem Relation Age of Onset  . Heart disease Mother   . Hypertension Mother   . Aneurysm Father 36       brain   . Hypertension Sister   . Heart disease Maternal Grandmother   . Cancer Paternal Grandmother   . Cancer Paternal Grandfather   . Breast cancer Maternal Aunt   . BRCA 1/2 Cousin        negative     Social History Social History   Tobacco Use  . Smoking status: Current Some Day Smoker    Packs/day: 0.50    Types: Cigarettes  . Smokeless tobacco: Never Used  . Tobacco comment: quit 1 year ago, sep 2013  Substance Use Topics  . Alcohol use: No    Alcohol/week: 0.0 standard drinks    Comment: occasionally  . Drug use: No     Allergies   Imitrex [sumatriptan], Iohexol, Prozac [fluoxetine hcl], Penicillins, Sulfa antibiotics, and Zoloft [sertraline hcl]   Review of Systems Review of Systems  All other systems reviewed and are negative.  Physical Exam Updated Vital Signs BP 124/74 (BP Location: Right Arm)   Pulse 70   Temp 98.6 F (37 C) (Oral)   Resp 16   LMP 02/27/2019   SpO2 99%   Physical Exam Vitals signs and nursing note reviewed.  Constitutional:      Appearance: She is well-developed.  HENT:     Head: Normocephalic and atraumatic.  Eyes:     General: No scleral icterus.       Right eye: No discharge.        Left eye: No discharge.     Conjunctiva/sclera: Conjunctivae normal.     Pupils: Pupils are equal, round, and reactive to light.  Neck:     Musculoskeletal: Normal range of motion.     Vascular: No JVD.     Trachea: No tracheal deviation.  Pulmonary:     Effort: Pulmonary effort is normal.     Breath sounds: No stridor.  Abdominal:     Comments: Minimal suprapubic tenderness palpation, remainder abdomen soft nontender-minimal bilateral CVA tenderness  Neurological:     Mental Status: She is alert and oriented to person, place, and time.     Coordination: Coordination normal.  Psychiatric:        Behavior: Behavior normal.        Thought Content: Thought content normal.        Judgment: Judgment normal.     ED Treatments / Results  Labs (all labs ordered are listed, but only abnormal results are displayed) Labs Reviewed  COMPREHENSIVE METABOLIC PANEL - Abnormal; Notable for the following components:      Result Value   Glucose, Bld 111  (*)    Creatinine, Ser 1.25 (*)    GFR calc non Af Amer 52 (*)    All other components within normal limits  URINALYSIS,   ROUTINE W REFLEX MICROSCOPIC - Abnormal; Notable for the following components:   Color, Urine AMBER (*)    APPearance HAZY (*)    Nitrite POSITIVE (*)    Bacteria, UA FEW (*)    All other components within normal limits  URINE CULTURE  CBC WITH DIFFERENTIAL/PLATELET  LIPASE, BLOOD  POC URINE PREG, ED    EKG None  Radiology No results found.  Procedures Procedures (including critical care time)  Medications Ordered in ED Medications  ondansetron (ZOFRAN) injection 4 mg (4 mg Intravenous Given 03/14/19 1546)  cefTRIAXone (ROCEPHIN) 1 g in sodium chloride 0.9 % 100 mL IVPB (0 g Intravenous Stopped 03/14/19 1738)     Initial Impression / Assessment and Plan / ED Course  I have reviewed the triage vital signs and the nursing notes.  Pertinent labs & imaging results that were available during my care of the patient were reviewed by me and considered in my medical decision making (see chart for details).        48 year old female presents today with urinary tract infection.  She is very well-appearing in no acute distress.  She is afebrile with no tachycardia.  Patient has reassuring laboratory analysis.  She does have nitrite positive urine.  She had urine culture sent, given ceftriaxone here.  Given significant allergies she will remain on Cipro.  She is given strict return precautions, she verbalized understanding and agreement to today's plan had no further questions or concerns at time of discharge.  Final Clinical Impressions(s) / ED Diagnoses   Final diagnoses:  Pyelonephritis    ED Discharge Orders         Ordered    cephALEXin (KEFLEX) 500 MG capsule  4 times daily     03/14/19 1825    ondansetron (ZOFRAN) 4 MG tablet  Every 6 hours     03/14/19 1825           Okey Regal, PA-C 03/14/19 1849    Charlesetta Shanks, MD 03/23/19 [phone]

## 2019-03-14 NOTE — ED Triage Notes (Signed)
Pt. Taking Cipro

## 2019-03-14 NOTE — Discharge Instructions (Signed)
Please read attached information. If you experience any new or worsening signs or symptoms please return to the emergency room for evaluation. Please follow-up with your primary care provider or specialist as discussed. Please use medication prescribed only as directed and discontinue taking if you have any concerning signs or symptoms.   °

## 2019-03-15 LAB — URINE CULTURE
Culture: <10000 — AB
Special Requests: NORMAL

## 2019-05-16 ENCOUNTER — Other Ambulatory Visit: Payer: Self-pay

## 2019-05-16 DIAGNOSIS — Z20822 Contact with and (suspected) exposure to covid-19: Secondary | ICD-10-CM

## 2019-05-18 LAB — NOVEL CORONAVIRUS, NAA: SARS-CoV-2, NAA: NOT DETECTED

## 2019-05-30 ENCOUNTER — Other Ambulatory Visit: Payer: Self-pay

## 2019-05-30 DIAGNOSIS — Z20822 Contact with and (suspected) exposure to covid-19: Secondary | ICD-10-CM

## 2019-06-01 LAB — NOVEL CORONAVIRUS, NAA: SARS-CoV-2, NAA: NOT DETECTED

## 2019-07-07 DIAGNOSIS — F3281 Premenstrual dysphoric disorder: Secondary | ICD-10-CM | POA: Insufficient documentation

## 2019-07-27 ENCOUNTER — Other Ambulatory Visit: Payer: Self-pay

## 2019-07-27 DIAGNOSIS — Z20822 Contact with and (suspected) exposure to covid-19: Secondary | ICD-10-CM

## 2019-07-28 LAB — NOVEL CORONAVIRUS, NAA: SARS-CoV-2, NAA: NOT DETECTED

## 2019-09-18 DIAGNOSIS — M51369 Other intervertebral disc degeneration, lumbar region without mention of lumbar back pain or lower extremity pain: Secondary | ICD-10-CM | POA: Insufficient documentation

## 2019-09-18 DIAGNOSIS — M5136 Other intervertebral disc degeneration, lumbar region: Secondary | ICD-10-CM | POA: Insufficient documentation

## 2019-10-06 MED FILL — MELOXICAM 15 MG TABLET: 15 | 15 days supply | Qty: 15 | Fill #0

## 2019-10-10 MED FILL — GABAPENTIN 300 MG CAPSULE: 300 | 10 days supply | Qty: 10 | Fill #0

## 2019-11-01 ENCOUNTER — Other Ambulatory Visit: Payer: Self-pay | Admitting: Orthopedic Surgery

## 2019-11-01 ENCOUNTER — Telehealth: Payer: Self-pay

## 2019-11-01 DIAGNOSIS — M5136 Other intervertebral disc degeneration, lumbar region: Secondary | ICD-10-CM

## 2019-11-01 NOTE — Telephone Encounter (Signed)
Spoke with patient regarding her CT contrast allergy related to her upcoming LESI.  She stated she had one of these injections two and a half weeks without any prep and did fine.  She reports having some facial flushing, feeling hot and "maybe some tachycardia" but read that those were normal side effects of the steroid.  She did confirm she developed hives after a CT scan w/ IV contrast a few years ago.  She and I decided to forego a 13hr prep.  I gave her the option of taking Benadryl 50mg  PO one hour before the injection versus taking nothing and seeing how she does.  She understands she could take the Benadryl after the injection if she developed any hives or itching.  If this did happen, she knows to call so that we can update her allergy in epic.  Finally, I told her she would still need to have a 13hr prep before any CT scan w/ IV contrast.  She stated an understanding of this.

## 2019-11-02 ENCOUNTER — Other Ambulatory Visit: Payer: Self-pay

## 2019-11-10 ENCOUNTER — Inpatient Hospital Stay: Admission: RE | Admit: 2019-11-10 | Payer: Self-pay | Source: Ambulatory Visit

## 2019-11-13 ENCOUNTER — Ambulatory Visit
Admission: RE | Admit: 2019-11-13 | Discharge: 2019-11-13 | Disposition: A | Discharge status: Home / Self Care | Payer: 59 | Source: Ambulatory Visit | Attending: Orthopedic Surgery | Admitting: Orthopedic Surgery

## 2019-11-13 ENCOUNTER — Other Ambulatory Visit: Payer: Self-pay

## 2019-11-13 DIAGNOSIS — M5136 Other intervertebral disc degeneration, lumbar region: Secondary | ICD-10-CM

## 2019-11-13 MED ORDER — IOPAMIDOL (ISOVUE-M 200) INJECTION 41%
1.0000 mL | Freq: Once | INTRAMUSCULAR | Status: AC
  Administered 2019-11-13: 1 mL via EPIDURAL

## 2019-11-13 MED ORDER — METHYLPREDNISOLONE ACETATE 40 MG/ML INJ SUSP (RADIOLOG
120.0000 mg | Freq: Once | INTRAMUSCULAR | Status: AC
  Administered 2019-11-13: 120 mg via EPIDURAL

## 2019-11-13 NOTE — Discharge Instructions (Signed)

## 2019-12-14 ENCOUNTER — Ambulatory Visit: Payer: 59 | Admitting: Podiatry

## 2019-12-14 ENCOUNTER — Ambulatory Visit (INDEPENDENT_AMBULATORY_CARE_PROVIDER_SITE_OTHER): Payer: 59

## 2019-12-14 ENCOUNTER — Other Ambulatory Visit: Payer: Self-pay

## 2019-12-14 ENCOUNTER — Encounter: Payer: Self-pay | Admitting: Podiatry

## 2019-12-14 DIAGNOSIS — M79671 Pain in right foot: Secondary | ICD-10-CM

## 2019-12-14 DIAGNOSIS — L84 Corns and callosities: Secondary | ICD-10-CM

## 2019-12-14 DIAGNOSIS — M2042 Other hammer toe(s) (acquired), left foot: Secondary | ICD-10-CM

## 2019-12-14 DIAGNOSIS — M79674 Pain in right toe(s): Secondary | ICD-10-CM | POA: Diagnosis not present

## 2019-12-14 DIAGNOSIS — M722 Plantar fascial fibromatosis: Secondary | ICD-10-CM | POA: Diagnosis not present

## 2019-12-14 DIAGNOSIS — M2041 Other hammer toe(s) (acquired), right foot: Secondary | ICD-10-CM | POA: Diagnosis not present

## 2019-12-14 DIAGNOSIS — M79675 Pain in left toe(s): Secondary | ICD-10-CM

## 2019-12-14 NOTE — Patient Instructions (Signed)

## 2019-12-19 ENCOUNTER — Other Ambulatory Visit: Payer: Self-pay | Admitting: Podiatry

## 2019-12-19 ENCOUNTER — Other Ambulatory Visit: Payer: Self-pay

## 2019-12-19 ENCOUNTER — Encounter: Payer: Self-pay | Admitting: Family Medicine

## 2019-12-19 ENCOUNTER — Ambulatory Visit: Payer: 59 | Admitting: Family Medicine

## 2019-12-19 VITALS — BP 96/68 | HR 79 | Temp 97.8°F | Ht 68.5 in | Wt 171.0 lb

## 2019-12-19 DIAGNOSIS — R0981 Nasal congestion: Secondary | ICD-10-CM

## 2019-12-19 DIAGNOSIS — G44209 Tension-type headache, unspecified, not intractable: Secondary | ICD-10-CM

## 2019-12-19 DIAGNOSIS — M722 Plantar fascial fibromatosis: Secondary | ICD-10-CM

## 2019-12-19 DIAGNOSIS — R5383 Other fatigue: Secondary | ICD-10-CM | POA: Diagnosis not present

## 2019-12-19 DIAGNOSIS — E559 Vitamin D deficiency, unspecified: Secondary | ICD-10-CM

## 2019-12-19 DIAGNOSIS — M2042 Other hammer toe(s) (acquired), left foot: Secondary | ICD-10-CM

## 2019-12-19 MED ORDER — CYCLOBENZAPRINE HCL 10 MG PO TABS: 10.0000 mg | ORAL_TABLET | Freq: Three times a day (TID) | ORAL | 0 refills | Status: DC | PRN

## 2019-12-19 MED ORDER — FLUTICASONE PROPIONATE 50 MCG/ACT NA SUSP: 1.0000 | Freq: Two times a day (BID) | NASAL | 6 refills | Status: DC

## 2019-12-19 NOTE — Patient Instructions (Signed)
° ° ° °  If you have lab work done today you will be contacted with your lab results within the next 2 weeks.  If you have not heard from us then please contact us. The fastest way to get your results is to register for My Chart. ° ° °IF you received an x-ray today, you will receive an invoice from [facility]. Please contact [facility] at [phone] with questions or concerns regarding your invoice.  ° °IF you received labwork today, you will receive an invoice from [facility]. Please contact [facility] at [phone] with questions or concerns regarding your invoice.  ° °Our billing staff will not be able to assist you with questions regarding bills from these companies. ° °You will be contacted with the lab results as soon as they are available. The fastest way to get your results is to activate your My Chart account. Instructions are located on the last page of this paperwork. If you have not heard from us regarding the results in 2 weeks, please contact this office. °  ° ° ° °

## 2019-12-19 NOTE — Progress Notes (Signed)
3/30/202110:58 AM  Victoria Barr [birth year], 49 y.o., female [id number]  Chief Complaint  Patient presents with  . migraines    x 2 weeks / headache - since period 3/17  . herniated disc in 08/2019    has received 2 injections which have helped / has seen ortho DDD- L, spondylosis    HPI:   Patient is a 49 y.o. female with past medical history significant for herniated disc and headaches who presents today to establish care  Needs to establish care due to change in insurance [facility] P[facility]   Had been seeing ortho, Dr Georganna Skeans, for herniated disc, received epidural injections, currently doing well, wo pain, would like to establish with new ortho spine  Since march 17th, LMP, she has been having daily headache, she reports headaches start occipital and radiate to the front, bilateral/midline, sometimes it turns into migraines, having neck pain, h/o migraines, h/o cluster HA Has been using sinus issues - using her nasal sprays Has also tried motrin No dizziness, no nausea, no vomiting, no vision changes, no changes in coordination Feels very tired Does not drink much water Chronic long standing "blah" has been on many antidepressants, does not like them, looking into [place]  She reports h/o low vitamin D, tends to take supplement about 3 x week   Hearing Screening   '125Hz'$  '250Hz'$  '500Hz'$  '1000Hz'$  '2000Hz'$  '3000Hz'$  '4000Hz'$  '6000Hz'$  '8000Hz'$   Right ear:           Left ear:             Visual Acuity Screening   Right eye Left eye Both eyes  Without correction: 2020 2015 2015  With correction:       Depression screen [facility] 2/9 12/19/2019 01/07/2016 10/22/2015  Decreased Interest 0 0 2  Down, Depressed, Hopeless 0 0 2  PHQ - 2 Score 0 0 4  Altered sleeping - - 2  Tired, decreased energy - - 2  Change in appetite - - 0  Feeling bad or failure about yourself  - - 2  Trouble concentrating - - 0  Moving slowly or fidgety/restless - - 0  Suicidal thoughts - - 1  PHQ-9 Score - - 11    Difficult doing work/chores - - Very difficult    Fall Risk  12/19/2019 10/22/2015 10/11/2015 07/23/2015 05/23/2015  Falls in the past year? 0 No No No No  Number falls in past yr: 0 - - - -  Injury with Fall? 0 - - - -  Follow up Falls evaluation completed - - - -     Allergies  Allergen Reactions  . Imitrex [Sumatriptan] Anaphylaxis and Hives  . Iohexol Hives    Patient must pre medicated if having an exam with contrast per Dr. Maxwell// rls 11/01/19 Pt tolerates LESI w/o prep. [name], RN  . Penicillins Hives    Has patient had a PCN reaction causing immediate rash, facial/tongue/throat swelling, SOB or lightheadedness with hypotension: Yes Has patient had a PCN reaction causing severe rash involving mucus membranes or skin necrosis: Yes Has patient had a PCN reaction that required hospitalization Yes Has patient had a PCN reaction occurring within the last 10 years: No If all of the above answers are "NO", then may proceed with Cephalosporin use.   . Prozac [Fluoxetine Hcl] Hives  . Sulfa Antibiotics Hives  . Zoloft [Sertraline Hcl] Hives    Prior to Admission medications   Medication Sig Start Date End Date Taking? Authorizing Provider  azelastine (ASTELIN) 0.1 % nasal spray    Yes [provider]  Cholecalciferol 125 MCG (5000 UT) TABS Take by mouth.   Yes [provider]  diclofenac (VOLTAREN) 50 MG EC tablet    Yes [provider]  ibuprofen (ADVIL,MOTRIN) 800 MG tablet Take 1 tablet (800 mg total) by mouth every 8 (eight) hours as needed. 07/12/17  Yes Salvadore Dom, MD    Past Medical History:  Diagnosis Date  . Allergy   . Anxiety   . Arthritis   . Depression   . GERD (gastroesophageal reflux disease) 04/08/2016  . History of abnormal cervical Pap smear 2006, 2015   2015 Neg with + HR HPV, 09/2014 ASCUS with + HR HPV, colpo with LEEP 11/2014-LGSIL (CIN-I)  . Hyperlipidemia   . Intestinal malrotation   . MVP (mitral valve prolapse)     echo in 2012 did not show MVP but did have mild elongation of the AMVL  . Palpitations   . Pyelonephritis 03/2015   evaluated by urology (Dr. Karsten Ro)  . Recurrent UTI   . Tachycardia     Past Surgical History:  Procedure Laterality Date  . APPENDECTOMY  1999  . CARDIAC CATHETERIZATION N/A 04/01/2016   Procedure: Left Heart Cath and Coronary Angiography;  Surgeon: Burnell Blanks, MD;  Location: [facility];  Service: Cardiovascular;  Laterality: N/A;  . CERVICAL BIOPSY  W/ LOOP ELECTRODE EXCISION  11/28/14   LGSIL with negative margins  . COLPOSCOPY  2006, 2015   2006 LGSIL, normal since; 2015 LGSIL w/LEEP  . EXCISION MORTON'S NEUROMA Bilateral 2002  . HEMORRHOID SURGERY    . INTESTINAL MALROTATION REPAIR  1999  . NASAL SINUS SURGERY    . TUBAL LIGATION      Social History   Tobacco Use  . Smoking status: Current Some Day Smoker    Packs/day: 0.50    Types: Cigarettes  . Smokeless tobacco: Never Used  . Tobacco comment: quit 1 year ago, sep 2013  Substance Use Topics  . Alcohol use: No    Alcohol/week: 0.0 standard drinks    Comment: occasionally    Family History  Problem Relation Age of Onset  . Heart disease Mother   . Hypertension Mother   . Aneurysm Father 46       brain   . Hypertension Sister   . Heart disease Maternal Grandmother   . Cancer Paternal Grandmother   . Cancer Paternal Grandfather   . Breast cancer Maternal Aunt   . BRCA 1/2 Cousin        negative    Review of Systems  Constitutional: Positive for malaise/fatigue. Negative for chills and fever.  HENT: Positive for congestion.   Respiratory: Negative for cough and shortness of breath.   Cardiovascular: Negative for chest pain, palpitations and leg swelling.  Gastrointestinal: Negative for abdominal pain, nausea and vomiting.  Neurological: Positive for headaches.   Per hpi  OBJECTIVE:  Today's Vitals   12/19/19 1024  BP: 96/68  Pulse: 79  Temp: 97.8 F (36.6 C)   SpO2: 96%  Weight: 171 lb (77.6 kg)  Height: 5' 8.5" (1.74 m)   Body mass index is 25.62 kg/m.   Physical Exam Vitals and nursing note reviewed.  Constitutional:      Appearance: She is well-developed.  HENT:     Head: Normocephalic and atraumatic.     Right Ear: Hearing, ear canal and external ear normal. A middle ear effusion is present.  Left Ear: Hearing, tympanic membrane, ear canal and external ear normal.     Nose: Congestion and rhinorrhea present.     Mouth/Throat:     Mouth: Mucous membranes are moist.     Pharynx: Oropharynx is clear.  Eyes:     Extraocular Movements: Extraocular movements intact.     Conjunctiva/sclera: Conjunctivae normal.     Pupils: Pupils are equal, round, and reactive to light.  Neck:     Thyroid: No thyroid mass, thyromegaly or thyroid tenderness.  Cardiovascular:     Rate and Rhythm: Normal rate and regular rhythm.     Heart sounds: Normal heart sounds. No murmur. No friction rub. No gallop.   Pulmonary:     Effort: Pulmonary effort is normal.     Breath sounds: Normal breath sounds. No wheezing or rales.  Musculoskeletal:     Cervical back: Neck supple. Muscular tenderness present. No spinous process tenderness.  Lymphadenopathy:     Cervical: No cervical adenopathy.  Skin:    General: Skin is warm and dry.  Neurological:     Mental Status: She is alert and oriented to person, place, and time.       No results found for this or any previous visit (from the past 24 hour(s)).     ASSESSMENT and PLAN  1. Sinus congestion Start flonase and OTC claritin  2. Fatigue, unspecified type Most likely multifactorial, low BP in clinic, push fluids and salt. pursue [place] for long standing depression vs dysthymia. Labs pending. - CMP14+EGFR - CBC - TSH  3. Tension headache Discussed supportive measures, trial of flexeril prn. Reviewed r/se/b  4. Vitamin D deficiency Checking labs today, medications will be adjusted as needed.   - Vitamin D, 25-hydroxy  Other orders - azelastine (ASTELIN) 0.1 % nasal spray - diclofenac (VOLTAREN) 50 MG EC tablet - fluticasone (FLONASE) 50 MCG/ACT nasal spray; Place 1 spray into both nostrils 2 (two) times daily. - cyclobenzaprine (FLEXERIL) 10 MG tablet; Take 1 tablet (10 mg total) by mouth 3 (three) times daily as needed for muscle spasms.  Return if symptoms worsen or fail to improve.    Rutherford Guys, MD Primary [facility] at [place],  [address] Ph.  [phone] Fax [phone]

## 2019-12-19 NOTE — Progress Notes (Signed)
Subjective:   Patient ID: Victoria Barr, female   DOB: 49 y.o.   MRN: [id number]   HPI 49 year old female presents the office today for concerns of discomfort in the bottom of her right heel which Victoria Barr for last 1 month.  She states that she recently took some diclofenac which did help with the pain and is doing much better.  She denies any recent injury.  No rating pain or weakness.  No recent injury.  She also some concern of her fourth and fifth toes starting with shoes on both feet.  Area seems to be worse with pressure.  No open sores but she does get small callus formation present.  CMA for Dr. Nadyne Barr   Review of Systems  All other systems reviewed and are negative.  Past Medical History:  Diagnosis Date  . Allergy   . Anxiety   . Arthritis   . Depression   . GERD (gastroesophageal reflux disease) 04/08/2016  . History of abnormal cervical Pap smear 2006, 2015   2015 Neg with + HR HPV, 09/2014 ASCUS with + HR HPV, colpo with LEEP 11/2014-LGSIL (CIN-I)  . Hyperlipidemia   . Hypertension 05/2013  . Intestinal malrotation   . MVP (mitral valve prolapse)    echo in 2012 did not show MVP but did have mild elongation of the AMVL  . Palpitations   . Pyelonephritis 03/2015   evaluated by urology (Victoria Barr)  . Recurrent UTI   . Tachycardia     Past Surgical History:  Procedure Laterality Date  . APPENDECTOMY  1999  . CARDIAC CATHETERIZATION N/A 04/01/2016   Procedure: Left Heart Cath and Coronary Angiography;  Surgeon: Victoria Blanks, MD;  Location: [facility];  Service: Cardiovascular;  Laterality: N/A;  . CERVICAL BIOPSY  W/ LOOP ELECTRODE EXCISION  11/28/14   LGSIL with negative margins  . COLPOSCOPY  2006, 2015   2006 LGSIL, normal since; 2015 LGSIL w/LEEP  . EXCISION MORTON'S NEUROMA Bilateral 2002  . HEMORRHOID SURGERY    . INTESTINAL MALROTATION REPAIR  1999  . NASAL SINUS SURGERY    . TUBAL LIGATION       Current Outpatient Medications:  .   azelastine (ASTELIN) 0.1 % nasal spray, Place into the nose., Disp: , Rfl:  .  cephALEXin (KEFLEX) 500 MG capsule, Take 1 capsule (500 mg total) by mouth 4 (four) times daily., Disp: 40 capsule, Rfl: 0 .  Cholecalciferol 125 MCG (5000 UT) TABS, Take by mouth., Disp: , Rfl:  .  gabapentin (NEURONTIN) 300 MG capsule, , Disp: , Rfl:  .  ibuprofen (ADVIL,MOTRIN) 800 MG tablet, Take 1 tablet (800 mg total) by mouth every 8 (eight) hours as needed., Disp: 30 tablet, Rfl: 1 .  meloxicam (MOBIC) 15 MG tablet, Take 15 mg by mouth daily., Disp: , Rfl:  .  methylPREDNISolone (MEDROL DOSEPAK) 4 MG TBPK tablet, TAKE 6 TABLETS ON DAY 1 AS DIRECTED ON PACKAGE AND DECREASE BY 1 TAB EACH DAY FOR A TOTAL OF 6 DAYS, Disp: , Rfl:  .  ondansetron (ZOFRAN) 4 MG tablet, Take 1 tablet (4 mg total) by mouth every 6 (six) hours., Disp: 12 tablet, Rfl: 0  Allergies  Allergen Reactions  . Imitrex [Sumatriptan] Anaphylaxis and Hives  . Iohexol Hives    Patient must pre medicated if having an exam with contrast per Victoria Barr// Barr 11/01/19 Pt tolerates LESI w/o prep. Victoria Lore, RN  . Penicillins Hives    Has patient had a  PCN reaction causing immediate rash, facial/tongue/throat swelling, SOB or lightheadedness with hypotension: Yes Has patient had a PCN reaction causing severe rash involving mucus membranes or skin necrosis: Yes Has patient had a PCN reaction that required hospitalization Yes Has patient had a PCN reaction occurring within the last 10 years: No If all of the above answers are "NO", then may proceed with Cephalosporin use.   . Prozac [Fluoxetine Hcl] Hives  . Sulfa Antibiotics Hives  . Zoloft [Sertraline Hcl] Hives          Objective:  Physical Exam  General: AAO x3, NAD  Dermatological: Minimal hyperkeratotic lesions present along the fourth and fifth toes bilaterally with the toes are rubbing.  No open sores.  No edema, erythema or any signs of infection.  Vascular: Dorsalis Pedis artery and  Posterior Tibial artery pedal pulses are 2/4 bilateral with immedate capillary fill time.  There is no pain with calf compression, swelling, warmth, erythema.   Neruologic: Grossly intact via light touch bilateral.  Negative Tinel sign.  Musculoskeletal: Tenderness palpation although mild on plantar medial tubercle of the calcaneus at the insertion of plantar fascia.  Plantar fascial, Achilles tendon appear to be intact.  No pain with lateral compression of calcaneus.  Adductovarus present fourth and fifth toes near rubbing causing corns interdigitally.  This seems to be causing more of her discomfort today.  Muscular strength 5/5 in all groups tested bilateral.  Gait: Unassisted, Nonantalgic.       Assessment:   Plantar fasciitis right side with adductovarus/hammertoe deformities causing interdigital lesions     Plan:  -Treatment options discussed including all alternatives, risks, and complications -Etiology of symptoms were discussed -X-rays were obtained and reviewed with the patient.  No evidence of acute fracture or stress fracture identified today.  Adductovarus present of the digits.  1.  Plantar fasciitis -We discussed steroid injection we held off on this today.  Continue anti-inflammatories.  Discussed stretching, icing daily.  Discussed wearing supportive shoes and also inserts.  2.  Adductovarus, hammertoe deformity resulting in painful hyperkeratotic lesions -I debrided the lesion slowly any complications.  Discussed both conservative as well as surgical treatment options.  Dispensed offloading pads we discussed shoe modifications for this.  Also discussed with her repair of the fourth and fifth digits.  Discussed the surgery as well as postoperative course.  She will consider her options.  Return if symptoms worsen or fail to improve.  Victoria Barr DPM

## 2019-12-20 LAB — CBC
Hematocrit: 43.9 % (ref 34.0–46.6)
Hemoglobin: 14.7 g/dL (ref 11.1–15.9)
MCH: 30.8 pg (ref 26.6–33.0)
MCHC: 33.5 g/dL (ref 31.5–35.7)
MCV: 92 fL (ref 79–97)
Platelets: 276 10*3/uL (ref 150–450)
RBC: 4.77 x10E6/uL (ref 3.77–5.28)
RDW: 13.3 % (ref 11.7–15.4)
WBC: 8.1 10*3/uL (ref 3.4–10.8)

## 2019-12-20 LAB — CMP14+EGFR
ALT: 13 IU/L (ref 0–32)
AST: 16 IU/L (ref 0–40)
Albumin/Globulin Ratio: 1.7 (ref 1.2–2.2)
Albumin: 4 g/dL (ref 3.8–4.8)
Alkaline Phosphatase: 90 IU/L (ref 39–117)
BUN/Creatinine Ratio: 13 (ref 9–23)
BUN: 9 mg/dL (ref 6–24)
Bilirubin Total: <0.2 mg/dL (ref 0.0–1.2)
CO2: 22 mmol/L (ref 20–29)
Calcium: 9.4 mg/dL (ref 8.7–10.2)
Chloride: 103 mmol/L (ref 96–106)
Creatinine, Ser: 0.71 mg/dL (ref 0.57–1.00)
GFR calc Af Amer: 118 mL/min/{1.73_m2} (ref >59)
GFR calc non Af Amer: 102 mL/min/{1.73_m2} (ref >59)
Globulin, Total: 2.3 g/dL (ref 1.5–4.5)
Glucose: 79 mg/dL (ref 65–99)
Potassium: 4.4 mmol/L (ref 3.5–5.2)
Sodium: 140 mmol/L (ref 134–144)
Total Protein: 6.3 g/dL (ref 6.0–8.5)

## 2019-12-20 LAB — TSH: TSH: 0.749 u[IU]/mL (ref 0.450–4.500)

## 2019-12-20 LAB — VITAMIN D 25 HYDROXY (VIT D DEFICIENCY, FRACTURES): Vit D, 25-Hydroxy: 33.5 ng/mL (ref 30.0–100.0)

## 2020-01-10 ENCOUNTER — Encounter: Payer: Self-pay | Admitting: Family Medicine

## 2020-01-11 ENCOUNTER — Other Ambulatory Visit: Payer: Self-pay | Admitting: Family Medicine

## 2020-01-11 MED ORDER — DOXYCYCLINE HYCLATE 100 MG PO TABS: 100.0000 mg | ORAL_TABLET | Freq: Two times a day (BID) | ORAL | 0 refills | Status: DC

## 2020-01-11 MED FILL — FLUTICASONE PROP 50 MCG SPR: 50 | 30 days supply | Qty: 16 | Fill #0

## 2020-01-11 MED FILL — DOXYCYCLINE HYCLATE 100 MG: 100 | 7 days supply | Qty: 14 | Fill #0

## 2020-01-11 MED FILL — CYCLOBENZAPRINE HCL 10 MG T: 10 | 10 days supply | Qty: 30 | Fill #0

## 2020-01-11 NOTE — Telephone Encounter (Signed)
Pt is still experiencing severe headaches daily, she is now wondering if she has a sinus infection as she is now having facial pain, does she need a new visit?

## 2020-02-02 ENCOUNTER — Other Ambulatory Visit (HOSPITAL_COMMUNITY): Payer: Self-pay | Admitting: Sports Medicine

## 2020-02-28 ENCOUNTER — Other Ambulatory Visit (HOSPITAL_COMMUNITY): Payer: Self-pay | Admitting: Cardiology

## 2020-02-29 LAB — HCV INTERPRETATION

## 2020-02-29 LAB — HCV AB W REFLEX TO QUANT PCR: HCV Ab: <0.1 s/co ratio (ref 0.0–0.9)

## 2020-02-29 LAB — HEPATITIS B SURFACE ANTIGEN: Hepatitis B Surface Ag: NEGATIVE

## 2020-02-29 LAB — HIV ANTIBODY (ROUTINE TESTING W REFLEX): HIV Screen 4th Generation wRfx: NONREACTIVE

## 2020-02-29 LAB — HEPATITIS B SURFACE ANTIBODY, QUANTITATIVE: Hepatitis B Surf Ab Quant: 430.4 m[IU]/mL (ref >9.9)

## 2020-03-13 ENCOUNTER — Telehealth: Payer: 59 | Admitting: Registered Nurse

## 2020-03-13 ENCOUNTER — Other Ambulatory Visit: Payer: Self-pay

## 2020-03-13 ENCOUNTER — Encounter: Payer: Self-pay | Admitting: Registered Nurse

## 2020-03-13 VITALS — Temp 97.8°F | Ht 68.0 in | Wt 174.0 lb

## 2020-03-13 DIAGNOSIS — J069 Acute upper respiratory infection, unspecified: Secondary | ICD-10-CM | POA: Diagnosis not present

## 2020-03-13 MED ORDER — AZITHROMYCIN 250 MG PO TABS: ORAL_TABLET | ORAL | 0 refills | Status: DC

## 2020-03-13 NOTE — Progress Notes (Signed)
Telemedicine Encounter- SOAP NOTE Established Patient  This telephone encounter was conducted with the patient's (or proxy's) verbal consent via audio telecommunications: yes  Patient was instructed to have this encounter in a suitably private space; and to only have persons present to whom they give permission to participate. In addition, patient identity was confirmed by use of name plus two identifiers (DOB and address).  I discussed the limitations, risks, security and privacy concerns of performing an evaluation and management service by telephone and the availability of in person appointments. I also discussed with the patient that there may be a patient responsible charge related to this service. The patient expressed understanding and agreed to proceed.  I spent a total of 14 minutes talking with the patient or their proxy.  Chief Complaint  Patient presents with  . Headache    started saturday. Worse on Monday    . Sore Throat    with white pockets in the back of throat.   . Nasal Congestion  . Cough    Subjective   Victoria Barr is a 49 y.o. established patient. Telephone visit today for congestion  HPI Pt has had ongoing congestion for about 1 week. Waxes and wanes but currently worsening. Nasal congestion, pnd, sinus pressure, mild headache. Has had white patches on back of throat, but these come and go.   No other concerns at this time.  Patient Active Problem List   Diagnosis Date Noted  . DDD (degenerative disc disease), lumbar 09/18/2019  . PMDD (premenstrual dysphoric disorder) 07/07/2019  . GERD (gastroesophageal reflux disease) 04/08/2016  . Precordial pain   . Abnormal stress test   . Vitamin D deficiency 05/23/2015  . Depression 05/23/2015  . Acute infection of nasal sinus 04/25/2015  . Bilateral leg pain 11/13/2014  . Smoker 05/02/2014  . LGSIL (low grade squamous intraepithelial dysplasia) 05/02/2014  . VIN I (vulvar intraepithelial neoplasia  I) 05/02/2014  . Chest pain 11/30/2013  . Abnormal EKG 11/30/2013  . Palpitations   . MVP (mitral valve prolapse)   . Dysuria 06/09/2013    Past Medical History:  Diagnosis Date  . Allergy   . Anxiety   . Arthritis   . Depression   . GERD (gastroesophageal reflux disease) 04/08/2016  . History of abnormal cervical Pap smear 2006, 2015   2015 Neg with + HR HPV, 09/2014 ASCUS with + HR HPV, colpo with LEEP 11/2014-LGSIL (CIN-I)  . Hyperlipidemia   . Intestinal malrotation   . MVP (mitral valve prolapse)    echo in 2012 did not show MVP but did have mild elongation of the AMVL  . Palpitations   . Pyelonephritis 03/2015   evaluated by urology (Dr. Vernie Ammons)  . Recurrent UTI   . Tachycardia     Current Outpatient Medications  Medication Sig Dispense Refill  . Cholecalciferol 125 MCG (5000 UT) TABS Take by mouth.    . diclofenac (VOLTAREN) 50 MG EC tablet     . ibuprofen (ADVIL,MOTRIN) 800 MG tablet Take 1 tablet (800 mg total) by mouth every 8 (eight) hours as needed. 30 tablet 1  . azithromycin (ZITHROMAX) 250 MG tablet Take 2 tabs on first day. Then take 1 tab daily. Finish entire supply. 6 tablet 0   No current facility-administered medications for this visit.    Allergies  Allergen Reactions  . Imitrex [Sumatriptan] Anaphylaxis and Hives  . Iohexol Hives    Patient must pre medicated if having an exam with contrast per Dr.  Maxwell// rls 11/01/19 Pt tolerates LESI w/o prep. Larina Earthly, RN  . Penicillins Hives    Has patient had a PCN reaction causing immediate rash, facial/tongue/throat swelling, SOB or lightheadedness with hypotension: Yes Has patient had a PCN reaction causing severe rash involving mucus membranes or skin necrosis: Yes Has patient had a PCN reaction that required hospitalization Yes Has patient had a PCN reaction occurring within the last 10 years: No If all of the above answers are "NO", then may proceed with Cephalosporin use.   . Prozac [Fluoxetine Hcl]  Hives  . Sulfa Antibiotics Hives  . Zoloft [Sertraline Hcl] Hives  . Iodinated Diagnostic Agents Hives  . Shellfish Allergy Hives    Social History   Socioeconomic History  . Marital status: Single    Spouse name: Not on file  . Number of children: Not on file  . Years of education: Not on file  . Highest education level: Not on file  Occupational History  . Not on file  Tobacco Use  . Smoking status: Current Some Day Smoker    Packs/day: 0.50    Types: Cigarettes  . Smokeless tobacco: Never Used  . Tobacco comment: quit 1 year ago, sep 2013  Substance and Sexual Activity  . Alcohol use: No    Alcohol/week: 0.0 standard drinks    Comment: occasionally  . Drug use: No  . Sexual activity: Yes    Partners: Male    Birth control/protection: Surgical    Comment: BTL  Other Topics Concern  . Not on file  Social History Narrative  . Not on file   Social Determinants of Health   Financial Resource Strain:   . Difficulty of Paying Living Expenses:   Food Insecurity:   . Worried About Programme researcher, broadcasting/film/video in the Last Year:   . Barista in the Last Year:   Transportation Needs:   . Freight forwarder (Medical):   Marland Kitchen Lack of Transportation (Non-Medical):   Physical Activity:   . Days of Exercise per Week:   . Minutes of Exercise per Session:   Stress:   . Feeling of Stress :   Social Connections:   . Frequency of Communication with Friends and Family:   . Frequency of Social Gatherings with Friends and Family:   . Attends Religious Services:   . Active Member of Clubs or Organizations:   . Attends Banker Meetings:   Marland Kitchen Marital Status:   Intimate Partner Violence:   . Fear of Current or Ex-Partner:   . Emotionally Abused:   Marland Kitchen Physically Abused:   . Sexually Abused:     ROS Per hpi   Objective   Vitals as reported by the patient: Today's Vitals   03/13/20 1325  Temp: 97.8 F (36.6 C)  TempSrc: Temporal  Weight: 174 lb (78.9 kg)    Height: 5\' 8"  (1.727 m)    Victoria Barr was seen today for headache, sore throat, nasal congestion and cough.  Diagnoses and all orders for this visit:  Acute upper respiratory infection -     azithromycin (ZITHROMAX) 250 MG tablet; Take 2 tabs on first day. Then take 1 tab daily. Finish entire supply.   PLAN  Sounds as developing bacterial upper respiratory infection  z pack  Continue OTCs  Return precautions reviewed  Patient encouraged to call clinic with any questions, comments, or concerns.  I discussed the assessment and treatment plan with the patient. The patient was provided an opportunity  to ask questions and all were answered. The patient agreed with the plan and demonstrated an understanding of the instructions.   The patient was advised to call back or seek an in-person evaluation if the symptoms worsen or if the condition fails to improve as anticipated.  I provided 14 minutes of non-face-to-face time during this encounter.  Maximiano Coss, NP  Primary Care at [facility]

## 2020-03-13 NOTE — Patient Instructions (Signed)
° ° ° °  If you have lab work done today you will be contacted with your lab results within the next 2 weeks.  If you have not heard from us then please contact us. The fastest way to get your results is to register for My Chart. ° ° °IF you received an x-ray today, you will receive an invoice from [facility]. Please contact [facility] at [phone] with questions or concerns regarding your invoice.  ° °IF you received labwork today, you will receive an invoice from [facility]. Please contact [facility] at [phone] with questions or concerns regarding your invoice.  ° °Our billing staff will not be able to assist you with questions regarding bills from these companies. ° °You will be contacted with the lab results as soon as they are available. The fastest way to get your results is to activate your My Chart account. Instructions are located on the last page of this paperwork. If you have not heard from us regarding the results in 2 weeks, please contact this office. °  ° ° ° °

## 2020-03-14 ENCOUNTER — Encounter: Payer: Self-pay | Admitting: Registered Nurse

## 2020-03-15 NOTE — Telephone Encounter (Signed)
Patient was seen on video visit on 03/13/2020 did you take her out of work until Monday ? If so I will send the video to her mychart. Please Advise.

## 2020-03-18 NOTE — Telephone Encounter (Signed)
Patient work note sent though [employer].

## 2020-03-18 NOTE — Telephone Encounter (Signed)
We can write this work note Thank you  Jari Sportsman, NP

## 2020-03-19 ENCOUNTER — Other Ambulatory Visit: Payer: Self-pay | Admitting: Family Medicine

## 2020-03-19 DIAGNOSIS — Z1231 Encounter for screening mammogram for malignant neoplasm of breast: Secondary | ICD-10-CM

## 2020-03-21 ENCOUNTER — Emergency Department (HOSPITAL_COMMUNITY): Payer: 59

## 2020-03-21 ENCOUNTER — Other Ambulatory Visit: Payer: Self-pay

## 2020-03-21 ENCOUNTER — Emergency Department (HOSPITAL_COMMUNITY)
Admission: EM | Admit: 2020-03-21 | Discharge: 2020-03-21 | Disposition: A | Discharge status: Home / Self Care | Payer: 59 | Attending: Emergency Medicine | Admitting: Emergency Medicine

## 2020-03-21 ENCOUNTER — Encounter (HOSPITAL_COMMUNITY): Payer: Self-pay | Admitting: Emergency Medicine

## 2020-03-21 DIAGNOSIS — S0003XA Contusion of scalp, initial encounter: Secondary | ICD-10-CM | POA: Insufficient documentation

## 2020-03-21 DIAGNOSIS — Y999 Unspecified external cause status: Secondary | ICD-10-CM | POA: Insufficient documentation

## 2020-03-21 DIAGNOSIS — Y9389 Activity, other specified: Secondary | ICD-10-CM | POA: Insufficient documentation

## 2020-03-21 DIAGNOSIS — S0990XA Unspecified injury of head, initial encounter: Secondary | ICD-10-CM | POA: Diagnosis present

## 2020-03-21 DIAGNOSIS — S7012XA Contusion of left thigh, initial encounter: Secondary | ICD-10-CM | POA: Diagnosis not present

## 2020-03-21 DIAGNOSIS — F17210 Nicotine dependence, cigarettes, uncomplicated: Secondary | ICD-10-CM | POA: Diagnosis not present

## 2020-03-21 DIAGNOSIS — Y92410 Unspecified street and highway as the place of occurrence of the external cause: Secondary | ICD-10-CM | POA: Diagnosis not present

## 2020-03-21 DIAGNOSIS — S40212A Abrasion of left shoulder, initial encounter: Secondary | ICD-10-CM | POA: Insufficient documentation

## 2020-03-21 DIAGNOSIS — T07XXXA Unspecified multiple injuries, initial encounter: Secondary | ICD-10-CM
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
## 2020-04-15 ENCOUNTER — Ambulatory Visit: Payer: 59

## 2020-04-16 ENCOUNTER — Encounter (HOSPITAL_COMMUNITY): Payer: Self-pay

## 2020-04-16 ENCOUNTER — Ambulatory Visit (INDEPENDENT_AMBULATORY_CARE_PROVIDER_SITE_OTHER): Payer: 59

## 2020-04-16 ENCOUNTER — Other Ambulatory Visit: Payer: Self-pay

## 2020-04-16 ENCOUNTER — Ambulatory Visit (HOSPITAL_COMMUNITY)
Admission: EM | Admit: 2020-04-16 | Discharge: 2020-04-16 | Disposition: A | Discharge status: Home / Self Care | Payer: 59 | Attending: Family Medicine | Admitting: Family Medicine

## 2020-04-16 ENCOUNTER — Telehealth: Payer: Self-pay | Admitting: *Deleted

## 2020-04-16 DIAGNOSIS — M79671 Pain in right foot: Secondary | ICD-10-CM | POA: Diagnosis not present

## 2020-04-16 DIAGNOSIS — S91331A Puncture wound without foreign body, right foot, initial encounter: Secondary | ICD-10-CM

## 2020-04-16 MED ORDER — DOXYCYCLINE HYCLATE 100 MG PO CAPS
100.0000 mg | ORAL_CAPSULE | Freq: Two times a day (BID) | ORAL | 0 refills | Status: DC
Start: 2020-04-16 — End: 2020-04-22

## 2020-04-16 MED FILL — DOXYCYCLINE HYCLATE 100 MG: 100 | 7 days supply | Qty: 14 | Fill #0

## 2020-04-16 NOTE — ED Triage Notes (Signed)
Pt c/o pain to right foot after stepping on nail last night. Last tetanus 2013

## 2020-04-16 NOTE — Telephone Encounter (Signed)
I told pt that our office would like to extend an appt to her because puncture wound could go bad and I would have scheduler contact her for an appt with Dr. Ardelle Anton this week.

## 2020-04-16 NOTE — Telephone Encounter (Signed)
Victoria Barr states she stepped on a nail yesterday that went through her shoe and the pain is horrible she has an appt with Dr. Charlsie Merles tomorrow, ibuprofen is not helping.

## 2020-04-16 NOTE — Telephone Encounter (Signed)
I would still make her a follow up to evaluate post urgent care.

## 2020-04-16 NOTE — ED Provider Notes (Signed)
[facility]   [id number] 04/16/20 Arrival Time: 7124  ASSESSMENT & PLAN:  1. Foot pain, right   2. Puncture wound of right foot, initial encounter     I have personally viewed the imaging studies ordered this visit. No bony abnormalities of right foot appreciated.  Begin below to cover for possible developing infection. Declines Tdap booster; planning on getting COVID vaccine soon.  Meds ordered this encounter  Medications  . doxycycline (VIBRAMYCIN) 100 MG capsule    Sig: Take 1 capsule (100 mg total) by mouth 2 (two) times daily.    Dispense:  14 capsule    Refill:  0    Orders Placed This Encounter  Procedures  . DG Foot Complete Right    Recommend:  Follow-up Information    [facility]: [address]   Specialty: Urgent Care Why: If worsening or failing to improve as anticipated. Contact information: [place] [phone]               Reviewed expectations re: course of current medical issues. Questions answered. Outlined signs and symptoms indicating need for more acute intervention. Patient verbalized understanding. After Visit Summary given.  SUBJECTIVE: History from: patient. Victoria Barr is a 49 y.o. female who reports stepping on a nail yesterday evening; through flip-flop puncturing skin. Minimal bleeding. Now more painful and questions some swelling around puncture site. Afebrile. No current bleeding or drainage. No extremity sensation changes or weakness. Ibuprofen with mild help. Difficulty bearing weight secondary to pain.  Immunization History  Administered Date(s) Administered  . Influenza-Unspecified 06/22/2015, 06/25/2017  . Tdap 09/22/2011     Past Surgical History:  Procedure Laterality Date  . APPENDECTOMY  1999  . CARDIAC CATHETERIZATION N/A 04/01/2016   Procedure: Left Heart Cath and Coronary Angiography;  Surgeon: Burnell Blanks, MD;  Location: [facility];  Service: Cardiovascular;  Laterality: N/A;  . CERVICAL BIOPSY  W/ LOOP ELECTRODE EXCISION  11/28/14   LGSIL with negative margins  . COLPOSCOPY  2006, 2015   2006 LGSIL, normal since; 2015 LGSIL w/LEEP  . EXCISION MORTON'S NEUROMA Bilateral 2002  . HEMORRHOID SURGERY    . INTESTINAL MALROTATION REPAIR  1999  . NASAL SINUS SURGERY    . TUBAL LIGATION        OBJECTIVE:  Vitals:   04/16/20 1242  BP: (!) 146/89  Pulse: 87  Resp: 16  Temp: 98.1 F (36.7 C)  TempSrc: Oral  SpO2: 99%    General appearance: alert; no distress HEENT: [place]; AT Neck: supple with FROM Resp: unlabored respirations Extremities: . RLE: warm with well perfused appearance; distal plantar foot puncture site identified with mild surrounding erythema and swelling; no drainage or bleeding; very TTP Skin: warm and dry; no visible rashes Neurologic: normal sensation and strength of RLE Psychological: alert and cooperative; normal mood and affect  Imaging: DG Foot Complete Right  Result Date: 04/16/2020 CLINICAL DATA:  Stepped on nail last night. Entry site marked with a BB. This is over the distal fourth metacarpal on the AP view. EXAM: RIGHT FOOT COMPLETE - 3+ VIEW COMPARISON:  12/14/2019 FINDINGS: Negative for fracture. Negative for metal foreign body in the tissues. No arthropathy. IMPRESSION: Negative Electronically Signed   By: Franchot Gallo M.D.   On: 04/16/2020 14:13      Allergies  Allergen Reactions  . Imitrex [Sumatriptan] Anaphylaxis and Hives  . Iohexol Hives    Patient must pre medicated  if having an exam with contrast per Dr. Maxwell// rls 11/01/19 Pt tolerates LESI w/o prep. [name], RN  . Penicillins Hives    Has patient had a PCN reaction causing immediate rash, facial/tongue/throat swelling, SOB or lightheadedness with hypotension: Yes Has patient had a PCN reaction causing severe rash involving mucus membranes or skin necrosis: Yes Has patient had a PCN reaction that  required hospitalization Yes Has patient had a PCN reaction occurring within the last 10 years: No If all of the above answers are "NO", then may proceed with Cephalosporin use.   . Prozac [Fluoxetine Hcl] Hives  . Sulfa Antibiotics Hives  . Zoloft [Sertraline Hcl] Hives  . Iodinated Diagnostic Agents Hives  . Shellfish Allergy Hives    Past Medical History:  Diagnosis Date  . Allergy   . Anxiety   . Arthritis   . Depression   . GERD (gastroesophageal reflux disease) 04/08/2016  . History of abnormal cervical Pap smear 2006, 2015   2015 Neg with + HR HPV, 09/2014 ASCUS with + HR HPV, colpo with LEEP 11/2014-LGSIL (CIN-I)  . Hyperlipidemia   . Intestinal malrotation   . MVP (mitral valve prolapse)    echo in 2012 did not show MVP but did have mild elongation of the AMVL  . Palpitations   . Pyelonephritis 03/2015   evaluated by urology (Dr. Karsten Ro)  . Recurrent UTI   . Tachycardia    Social History   Socioeconomic History  . Marital status: Single    Spouse name: Not on file  . Number of children: Not on file  . Years of education: Not on file  . Highest education level: Not on file  Occupational History  . Not on file  Tobacco Use  . Smoking status: Current Some Day Smoker    Packs/day: 0.50    Types: Cigarettes  . Smokeless tobacco: Never Used  . Tobacco comment: quit 1 year ago, sep 2013  Substance and Sexual Activity  . Alcohol use: No    Alcohol/week: 0.0 standard drinks    Comment: occasionally  . Drug use: No  . Sexual activity: Yes    Partners: Male    Birth control/protection: Surgical    Comment: BTL  Other Topics Concern  . Not on file  Social History Narrative  . Not on file   Social Determinants of Health   Financial Resource Strain:   . Difficulty of Paying Living Expenses:   Food Insecurity:   . Worried About Charity fundraiser in the Last Year:   . Arboriculturist in the Last Year:   Transportation Needs:   . Film/video editor  (Medical):   Marland Kitchen Lack of Transportation (Non-Medical):   Physical Activity:   . Days of Exercise per Week:   . Minutes of Exercise per Session:   Stress:   . Feeling of Stress :   Social Connections:   . Frequency of Communication with Friends and Family:   . Frequency of Social Gatherings with Friends and Family:   . Attends Religious Services:   . Active Member of Clubs or Organizations:   . Attends Archivist Meetings:   Marland Kitchen Marital Status:    Family History  Problem Relation Age of Onset  . Heart disease Mother   . Hypertension Mother   . Aneurysm Father 25       brain   . Hypertension Sister   . Heart disease Maternal Grandmother   . Cancer Paternal  Grandmother   . Cancer Paternal Grandfather   . Breast cancer Maternal Aunt   . BRCA 1/2 Cousin        negative   Past Surgical History:  Procedure Laterality Date  . APPENDECTOMY  1999  . CARDIAC CATHETERIZATION N/A 04/01/2016   Procedure: Left Heart Cath and Coronary Angiography;  Surgeon: Burnell Blanks, MD;  Location: [facility];  Service: Cardiovascular;  Laterality: N/A;  . CERVICAL BIOPSY  W/ LOOP ELECTRODE EXCISION  11/28/14   LGSIL with negative margins  . COLPOSCOPY  2006, 2015   2006 LGSIL, normal since; 2015 LGSIL w/LEEP  . EXCISION MORTON'S NEUROMA Bilateral 2002  . HEMORRHOID SURGERY    . INTESTINAL MALROTATION REPAIR  1999  . NASAL SINUS SURGERY    . TUBAL LIGATION        Vanessa Kick, MD 04/16/20 1450

## 2020-04-16 NOTE — Telephone Encounter (Signed)
I called pt to see if she could come in today and she states she is at the urgent care and would like to cancel her appt tomorrow with Dr. Charlsie Merles. Pt thanked me for my call.

## 2020-04-17 ENCOUNTER — Ambulatory Visit: Payer: 59 | Admitting: Podiatry

## 2020-04-17 NOTE — Telephone Encounter (Signed)
Called patient and lvm for her to call and schedule a follow up appt from Urgent care for puncture wound.

## 2020-04-18 ENCOUNTER — Ambulatory Visit: Payer: 59

## 2020-04-19 ENCOUNTER — Other Ambulatory Visit: Payer: Self-pay

## 2020-04-19 ENCOUNTER — Ambulatory Visit (INDEPENDENT_AMBULATORY_CARE_PROVIDER_SITE_OTHER): Payer: 59 | Admitting: Podiatry

## 2020-04-19 DIAGNOSIS — S91331A Puncture wound without foreign body, right foot, initial encounter: Secondary | ICD-10-CM

## 2020-04-19 DIAGNOSIS — L03115 Cellulitis of right lower limb: Secondary | ICD-10-CM

## 2020-04-19 NOTE — Progress Notes (Signed)
Subjective: 49 year old female presents the office for concerns of a puncture wound to the right foot.  She presented to urgent care and she was on antibiotics.  She states that there is still redness and swelling and she had some drainage and there is still tender.  She denies any fevers, chills, nausea, vomiting.  No chest pain, chest pain, shortness of breath.  Objective: AAO x3, NAD DP/PT pulses palpable bilaterally, CRT less than 3 seconds On the right fourth metatarsal plantarly there is evidence of puncture wound with some amount of clear drainage expressed there is no purulence.  There is localized edema erythema to this area there is edema to the foot.  Is no ascending cellulitis.  There is no fluctuation or crepitation identified at this time.  There is no malodor. No pain with calf compression, swelling, warmth, erythema  Assessment: Puncture wound right foot with concern for possible abscess  Plan: -All treatment options discussed with the patient including all alternatives, risks, complications.  -She was seen in urgent care independently reviewed the x-rays.  Also she has been on antibiotics and she states that over the last couple days has slowly been getting better but she still having pain and swelling.  Given the puncture wound as well as a localized edema around the wound I recommended incision and drainage of the area.  We will plan on doing this on Monday as it has been getting better.  However if over the weekend gets worse that she will report to the emergency department.  Encouraged elevation.   -The incision placement as well as the postoperative course was discussed with the patient. I discussed risks of the surgery which include, but not limited to, infection, bleeding, pain, swelling, need for further surgery, delayed or nonhealing, painful or ugly scar, numbness or sensation changes, over/under correction, recurrence, transfer lesions, further deformity, , DVT/PE, loss of  toe/foot. Patient understands these risks and wishes to proceed with surgery. The surgical consent was reviewed with the patient all 3 pages were signed. No promises or guarantees were given to the outcome of the procedure. All questions were answered to the best of my ability. Before the surgery the patient was encouraged to call the office if there is any further questions. The surgery will be performed at the [facility] on an outpatient basis. -Given the timing of the surgery she is aware that Dr. Marylene Land may be performing the surgery.  I discussed the case with Dr. Marylene Land as well.  [name] DPM  -Patient encouraged to call the office with any questions, concerns, change in symptoms.

## 2020-04-19 NOTE — Patient Instructions (Signed)

## 2020-04-22 ENCOUNTER — Other Ambulatory Visit: Payer: Self-pay | Admitting: Sports Medicine

## 2020-04-22 ENCOUNTER — Encounter: Payer: Self-pay | Admitting: Sports Medicine

## 2020-04-22 DIAGNOSIS — L03115 Cellulitis of right lower limb: Secondary | ICD-10-CM | POA: Diagnosis not present

## 2020-04-22 MED ORDER — IBUPROFEN 800 MG PO TABS: 800.0000 mg | ORAL_TABLET | Freq: Three times a day (TID) | ORAL | 0 refills | Status: DC | PRN

## 2020-04-22 MED ORDER — PROMETHAZINE HCL 12.5 MG PO TABS
12.5000 mg | ORAL_TABLET | Freq: Three times a day (TID) | ORAL | 0 refills | Status: DC | PRN
Start: 2020-04-22 — End: 2020-07-30

## 2020-04-22 MED ORDER — HYDROCODONE-ACETAMINOPHEN 10-325 MG PO TABS: 1.0000 | ORAL_TABLET | Freq: Four times a day (QID) | ORAL | 0 refills | Status: AC | PRN

## 2020-04-22 MED ORDER — DOXYCYCLINE HYCLATE 100 MG PO TABS
100.0000 mg | ORAL_TABLET | Freq: Two times a day (BID) | ORAL | 0 refills | Status: DC
Start: 2020-04-22 — End: 2020-07-30

## 2020-04-22 MED ORDER — DOCUSATE SODIUM 100 MG PO CAPS
100.0000 mg | ORAL_CAPSULE | Freq: Two times a day (BID) | ORAL | 0 refills | Status: DC
Start: 2020-04-22 — End: 2020-07-30

## 2020-04-22 NOTE — Progress Notes (Signed)
Refilled Doxcycline until intra-op cultures are completed

## 2020-04-22 NOTE — Progress Notes (Signed)
Post op meds entered 

## 2020-04-23 ENCOUNTER — Telehealth: Payer: Self-pay | Admitting: Sports Medicine

## 2020-04-23 NOTE — Telephone Encounter (Signed)
Postop check phone call made to patient.  Patient reports she is starting to have some pain is about to take some ibuprofen but otherwise is doing okay.  I discussed with patient surgical findings of rubber fragment and the puncture wound bed site.  Patient seemed relieved that I was able to remove the fragments from the site and reports any questions maybe that is what she was feeling.  I advised patient that after a puncture wound injury can have pain due to the injury itself as well as deformed fragments.  I advised patient that it was a good idea that we did surgery to remove any of the foreign objects and now it is important for her to rest ice elevate and to stay off her foot to allow the area to heal properly.  I advised patient to keep dressing clean dry and intact and to take medications as prescribed and to call office if she has any other problems issues or concerns.  Patient thanked me for my call. Dr. Marylene Land

## 2020-04-24 ENCOUNTER — Telehealth: Payer: Self-pay | Admitting: *Deleted

## 2020-04-24 DIAGNOSIS — Z9889 Other specified postprocedural states: Secondary | ICD-10-CM

## 2020-04-24 DIAGNOSIS — S91331A Puncture wound without foreign body, right foot, initial encounter: Secondary | ICD-10-CM

## 2020-04-24 NOTE — Telephone Encounter (Signed)
done

## 2020-04-24 NOTE — Telephone Encounter (Signed)
Faxed orders on required form, clinical and demographics to [facility] and emailed.

## 2020-04-24 NOTE — Telephone Encounter (Signed)
Pt states she is not able to use the crutches and would like a knee walker.

## 2020-04-29 ENCOUNTER — Ambulatory Visit (INDEPENDENT_AMBULATORY_CARE_PROVIDER_SITE_OTHER): Payer: 59 | Admitting: Podiatry

## 2020-04-29 ENCOUNTER — Other Ambulatory Visit: Payer: Self-pay

## 2020-04-29 DIAGNOSIS — L03115 Cellulitis of right lower limb: Secondary | ICD-10-CM

## 2020-04-29 DIAGNOSIS — Z9889 Other specified postprocedural states: Secondary | ICD-10-CM

## 2020-04-29 DIAGNOSIS — S91331A Puncture wound without foreign body, right foot, initial encounter: Secondary | ICD-10-CM

## 2020-05-02 ENCOUNTER — Other Ambulatory Visit: Payer: Self-pay

## 2020-05-02 ENCOUNTER — Ambulatory Visit (INDEPENDENT_AMBULATORY_CARE_PROVIDER_SITE_OTHER): Payer: 59 | Admitting: Podiatry

## 2020-05-02 DIAGNOSIS — L03115 Cellulitis of right lower limb: Secondary | ICD-10-CM

## 2020-05-02 DIAGNOSIS — Z9889 Other specified postprocedural states: Secondary | ICD-10-CM

## 2020-05-02 DIAGNOSIS — S91331A Puncture wound without foreign body, right foot, initial encounter: Secondary | ICD-10-CM

## 2020-05-05 NOTE — Progress Notes (Signed)
Subjective: Victoria Barr is a 49 y.o. is seen today in office s/p incision and drainage right foot preformed on 04/22/2020.  She states he has been having quite a bit of pain and she has been nonweightbearing.  She cannot wear the surgical shoe because of pressure but she is been using a knee scooter to avoid putting any pressure to her foot.  Denies any systemic complaints such as fevers, chills, nausea, vomiting. No calf pain, chest pain, shortness of breath.   Objective: General: No acute distress, AAOx3  DP/PT pulses palpable 2/4, CRT < 3 sec to all digits.  Protective sensation intact. Motor function intact.  Right foot: Incision is well coapted without any evidence of dehiscence with a central area packed open with saline moistened 4 x 4.  There is minimal edema.  The erythema patient almost resolved.  No ascending cellulitis there is no fluctuation crepitation but there is tenderness palpation to the wound. No other areas of tenderness to bilateral lower extremities.  No other open lesions or pre-ulcerative lesions.  No pain with calf compression, swelling, warmth, erythema.   Assessment and Plan:  Status post right foot incision and drainage  -Treatment options discussed including all alternatives, risks, and complications -Remove the packing and irrigated the wound today.  Lightly packed with iodoform packing followed by dry sterile dressing.  Continue nonweightbearing, elevation.  Pain medication as needed. -Monitor for any clinical signs or symptoms of infection and DVT/PE and directed to call the office immediately should any occur or go to the ER. -Follow-up later this week for dressing change or sooner if any problems arise. In the meantime, encouraged to call the office with any questions, concerns, change in symptoms.   Ovid Curd, DPM

## 2020-05-05 NOTE — Progress Notes (Signed)
Subjective: Victoria Barr is a 49 y.o. is seen today in office s/p incision and drainage right foot preformed on 04/22/2020.  She presents today for dressing change.  She said that she is feeling much better today and the pain is improved.  She is still nonweightbearing but asked about when she can start to drive. Denies any systemic complaints such as fevers, chills, nausea, vomiting. No calf pain, chest pain, shortness of breath.   Objective: General: No acute distress, AAOx3  DP/PT pulses palpable 2/4, CRT < 3 sec to all digits.  Protective sensation intact. Motor function intact.  Right foot: Incision is well coapted without any evidence of dehiscence with a central area packed open with iodoform packing.  Decreased edema and there is no surrounding erythema, ascending cellulitis there is no fluctuance or crepitation.  Decreased tenderness to the wound today.  The wound has started to feel little more compared to last appointment. No other areas of tenderness to bilateral lower extremities.  No other open lesions or pre-ulcerative lesions.  No pain with calf compression, swelling, warmth, erythema.   Assessment and Plan:  Status post right foot incision and drainage-improving  -Treatment options discussed including all alternatives, risks, and complications -Dressing was changed today.  Irrigated the wound and repacked with a small amount of iodoform packing followed by dry sterile dressing.  Encouraged elevation of the off of her foot.  Pain medication as needed.  I will see her back on Monday for another dressing change at that point likely she can start driving weight-bear as tolerated if she is continuing to heal.  Continued monitoring signs or symptoms of recurrence of infection.  No follow-ups on file.  [name] DPM

## 2020-05-06 ENCOUNTER — Encounter: Payer: Self-pay | Admitting: Podiatry

## 2020-05-06 ENCOUNTER — Ambulatory Visit (INDEPENDENT_AMBULATORY_CARE_PROVIDER_SITE_OTHER): Payer: 59 | Admitting: Podiatry

## 2020-05-06 ENCOUNTER — Other Ambulatory Visit: Payer: Self-pay

## 2020-05-06 DIAGNOSIS — Z9889 Other specified postprocedural states: Secondary | ICD-10-CM

## 2020-05-06 DIAGNOSIS — S91331A Puncture wound without foreign body, right foot, initial encounter: Secondary | ICD-10-CM

## 2020-05-13 NOTE — Progress Notes (Signed)
Subjective: Victoria Barr is a 49 y.o. is seen today in office s/p incision and drainage right foot preformed on 04/22/2020.  Overall states that she is feeling much better presents today for dressing change.  She is ready for stitches out.  She has no other concerns today.  Denies any fevers, chills, nausea, vomiting.  No calf pain, chest pain, shortness of breath.   Objective: General: No acute distress, AAOx3  DP/PT pulses palpable 2/4, CRT < 3 sec to all digits.  Protective sensation intact. Motor function intact.  Right foot: Incision is well coapted without any evidence of dehiscence with a central area packed open with iodoform packing.  For removal of packing the wound is more superficial and healing well.  There is no drainage or pus identified there is no surrounding erythema, ascending cellulitis.  There is no fluctuation crepitation.  There is no malodor. No other open lesions or pre-ulcerative lesions.  No pain with calf compression, swelling, warmth, erythema.   Assessment and Plan:  Status post right foot incision and drainage-improving  -Treatment options discussed including all alternatives, risks, and complications -Remove the packing today and incisions healing well.  Antibiotic ointment and dressing applied.  She can start with daily dressing changes.  She can transition to weightbearing as tolerated as tolerated otherwise remain in the knee scooter.  She does have a Darco wedge shoe.  I have not seen any culture results from the surgery letter stating that I will follow-up on this for her as well but clinically no signs of infection at this time.  Return in about 1 week (around 05/13/2020).  Victoria Barr DPM

## 2020-05-14 ENCOUNTER — Other Ambulatory Visit: Payer: Self-pay

## 2020-05-14 ENCOUNTER — Ambulatory Visit (INDEPENDENT_AMBULATORY_CARE_PROVIDER_SITE_OTHER): Payer: 59 | Admitting: Podiatry

## 2020-05-14 DIAGNOSIS — S91331A Puncture wound without foreign body, right foot, initial encounter: Secondary | ICD-10-CM

## 2020-05-14 DIAGNOSIS — Z9889 Other specified postprocedural states: Secondary | ICD-10-CM

## 2020-05-20 ENCOUNTER — Encounter: Payer: Self-pay | Admitting: Podiatry

## 2020-05-20 ENCOUNTER — Ambulatory Visit (INDEPENDENT_AMBULATORY_CARE_PROVIDER_SITE_OTHER): Payer: 59 | Admitting: Podiatry

## 2020-05-20 ENCOUNTER — Other Ambulatory Visit: Payer: Self-pay

## 2020-05-20 DIAGNOSIS — S91331A Puncture wound without foreign body, right foot, initial encounter: Secondary | ICD-10-CM

## 2020-05-20 DIAGNOSIS — Z9889 Other specified postprocedural states: Secondary | ICD-10-CM

## 2020-05-21 NOTE — Progress Notes (Signed)
Subjective: Victoria Barr is a 49 y.o. is seen today in office s/p incision and drainage right foot preformed on 04/22/2020. States that she is feeling better she started put some weight on the foot. Denies any drainage or pus or any swelling increase. She has no fevers, chills, nausea, vomiting. No calf pain, chest pain, shortness of breath.  Objective: General: No acute distress, AAOx3  DP/PT pulses palpable 2/4, CRT < 3 sec to all digits.  Protective sensation intact. Motor function intact.  Right foot: Incision is well coapted without any evidence of dehiscence the central aspect is still open however appears to be much more superficial. Sutures are still intact as well. There is minimal edema. There is no surrounding erythema, ascending cellulitis. There is no fluctuance or crepitation there is no malodor. Still tenderness palpation the site that improved No other open lesions or pre-ulcerative lesions.  No pain with calf compression, swelling, warmth, erythema.   Assessment and Plan:  Status post right foot incision and drainage-improving  -Treatment options discussed including all alternatives, risks, and complications -Sutures removed today. Continue antibiotic ointment dressing changes daily. Discussed Epson salt soaks. She relates wash the area with soap and water daily. She can weight-bear as tolerated in surgical shoe. Elevation. Likely chronically right work next Tuesday I will see her back on Monday for further evaluation. -Reviewed wound culture with her today. Clinically infection appears to be resolved.  No follow-ups on file.  [name] DPM

## 2020-05-23 NOTE — Progress Notes (Signed)
Subjective: Victoria Barr is a 49 y.o. is seen today in office s/p incision and drainage right foot preformed on 04/22/2020.  She states that she is feeling better and the pain is improving.  She is scheduled to go back to work tomorrow under light duty.  She is wearing a regular shoe today.  She denies any purulence.  She has occasional bloody drainage but minimal.  No surrounding redness or red streaks.  Denies any fevers, chills, nausea, vomiting.  No calf pain, chest pain, shortness of breath.  No other concerns today.     Objective: General: No acute distress, AAOx3  DP/PT pulses palpable 2/4, CRT < 3 sec to all digits.  Protective sensation intact. Motor function intact.  Right foot: Incision is well coapted without any evidence of dehiscence and only small superficial wounds present on the central aspect there is no probing.  There is minimal edema and there is no surrounding erythema, ascending cellulitis.  There is no fluctuation crepitation.  There is no malodor.  Decreased tenderness palpation to the surgical site. No other open lesions or pre-ulcerative lesions.  No pain with calf compression, swelling, warmth, erythema.   Assessment and Plan:  Status post right foot incision and drainage-improving  -Treatment options discussed including all alternatives, risks, and complications -The wound continues to improve.  Discussed soaking Epson salts daily and she can apply small amounts of antibiotic ointment and a dressing.  She is scheduled back to work tomorrow under light duty. -Monitor for any clinical signs or symptoms of infection and directed to call the office immediately should any occur or go to the ER.  Return in about 2 weeks (around 06/03/2020).  Victoria Barr DPM

## 2020-06-04 ENCOUNTER — Ambulatory Visit (INDEPENDENT_AMBULATORY_CARE_PROVIDER_SITE_OTHER): Payer: 59 | Admitting: Podiatry

## 2020-06-04 ENCOUNTER — Other Ambulatory Visit: Payer: Self-pay

## 2020-06-04 DIAGNOSIS — S91331A Puncture wound without foreign body, right foot, initial encounter: Secondary | ICD-10-CM

## 2020-06-04 DIAGNOSIS — Z9889 Other specified postprocedural states: Secondary | ICD-10-CM

## 2020-06-04 DIAGNOSIS — M79676 Pain in unspecified toe(s): Secondary | ICD-10-CM

## 2020-06-05 NOTE — Progress Notes (Signed)
Subjective: Victoria Barr is a 49 y.o. is seen today in office s/p incision and drainage right foot preformed on 04/22/2020.  She is back to work at full duty.  She has an occasional soreness but overall she is doing much better.  She denies recent injury she is wearing regular shoes.  No changes otherwise.  She denies any fevers, chills, nausea, vomiting.  No calf pain, chest pain, shortness of breath.   Objective: General: No acute distress, AAOx3  DP/PT pulses palpable 2/4, CRT < 3 sec to all digits.  Protective sensation intact. Motor function intact.  Right foot: Incision is well coapted without any evidence of dehiscence and the scar is well formed.  There is some scar tissue present on the incision site.  There is no significant edema there is no erythema, drainage or any open sores.  No ascending cellulitis. No other open lesions or pre-ulcerative lesions.  No pain with calf compression, swelling, warmth, erythema.   Assessment and Plan:  Status post right foot incision and drainage-improving  -Treatment options discussed including all alternatives, risks, and complications -She is back to full duty at work and she is wearing regular shoes without any significant discomfort.  Discussed with her to continue massage the scar daily with cocoa butter, vitamin E oil.  As per my discharge from the postoperative course and she agrees with plan has no further questions.  [name] DPM

## 2020-06-17 ENCOUNTER — Encounter: Payer: Self-pay | Admitting: Family Medicine

## 2020-06-27 ENCOUNTER — Other Ambulatory Visit: Payer: Self-pay | Admitting: Family Medicine

## 2020-06-27 DIAGNOSIS — Z1231 Encounter for screening mammogram for malignant neoplasm of breast: Secondary | ICD-10-CM

## 2020-07-18 ENCOUNTER — Other Ambulatory Visit: Payer: Self-pay

## 2020-07-18 ENCOUNTER — Ambulatory Visit
Admission: RE | Admit: 2020-07-18 | Discharge: 2020-07-18 | Disposition: A | Discharge status: Home / Self Care | Payer: 59 | Source: Ambulatory Visit

## 2020-07-18 DIAGNOSIS — Z1231 Encounter for screening mammogram for malignant neoplasm of breast: Secondary | ICD-10-CM

## 2020-07-26 MED FILL — DICLOFENAC SOD EC 75 MG TAB: 75 | 30 days supply | Qty: 60 | Fill #1
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
## 2020-07-30 ENCOUNTER — Ambulatory Visit: Payer: 59 | Admitting: Obstetrics and Gynecology

## 2020-07-30 ENCOUNTER — Encounter: Payer: Self-pay | Admitting: Obstetrics and Gynecology

## 2020-07-30 ENCOUNTER — Other Ambulatory Visit (HOSPITAL_COMMUNITY)
Admission: RE | Admit: 2020-07-30 | Discharge: 2020-07-30 | Disposition: A | Discharge status: Home / Self Care | Payer: 59 | Source: Ambulatory Visit | Attending: Obstetrics and Gynecology | Admitting: Obstetrics and Gynecology

## 2020-07-30 ENCOUNTER — Other Ambulatory Visit (HOSPITAL_COMMUNITY): Payer: Self-pay

## 2020-07-30 ENCOUNTER — Other Ambulatory Visit: Payer: Self-pay

## 2020-07-30 VITALS — BP 110/76 | HR 88 | Resp 16 | Ht 69.0 in | Wt 166.0 lb

## 2020-07-30 DIAGNOSIS — Z113 Encounter for screening for infections with a predominantly sexual mode of transmission: Secondary | ICD-10-CM

## 2020-07-30 DIAGNOSIS — Z01419 Encounter for gynecological examination (general) (routine) without abnormal findings: Secondary | ICD-10-CM | POA: Diagnosis not present

## 2020-07-30 MED ORDER — IBUPROFEN 800 MG PO TABS: 800.0000 mg | ORAL_TABLET | Freq: Three times a day (TID) | ORAL | 1 refills | Status: DC | PRN

## 2020-07-30 MED FILL — IBUPROFEN 800 MG TABS: 800 | 30 days supply | Qty: 90 | Fill #0

## 2020-07-30 MED FILL — PRAMIPEXOLE 0.25 MG TABLET: 0.25 | 30 days supply | Qty: 55 | Fill #0

## 2020-07-30 MED FILL — ONDANSETRON HCL 4 MG TABLET: 4 | 30 days supply | Qty: 25 | Fill #0

## 2020-07-30 NOTE — Patient Instructions (Signed)

## 2020-07-31 LAB — CYTOLOGY - PAP
Adequacy: ABSENT
Chlamydia: NEGATIVE
Comment: NEGATIVE
Comment: NEGATIVE
Comment: NEGATIVE
Comment: NORMAL
Diagnosis: NEGATIVE
High risk HPV: NEGATIVE
Neisseria Gonorrhea: NEGATIVE
Trichomonas: NEGATIVE

## 2020-07-31 LAB — RPR: RPR Ser Ql: NONREACTIVE

## 2020-09-05 ENCOUNTER — Ambulatory Visit (INDEPENDENT_AMBULATORY_CARE_PROVIDER_SITE_OTHER): Payer: 59 | Admitting: Podiatry

## 2020-09-05 ENCOUNTER — Other Ambulatory Visit: Payer: Self-pay

## 2020-09-05 ENCOUNTER — Encounter: Payer: Self-pay | Admitting: Podiatry

## 2020-09-05 DIAGNOSIS — M79671 Pain in right foot: Secondary | ICD-10-CM | POA: Diagnosis not present

## 2020-09-05 DIAGNOSIS — L989 Disorder of the skin and subcutaneous tissue, unspecified: Secondary | ICD-10-CM | POA: Diagnosis not present

## 2020-09-05 MED ORDER — UREA 40 % EX CREA: 1.0000 "application " | TOPICAL_CREAM | Freq: Every day | CUTANEOUS | 1 refills | Status: DC

## 2020-09-05 NOTE — Progress Notes (Signed)
Subjective: 49 year old female presents the office today for concerns of scar tissue, callus on the right foot on the area of the previous surgery.  She states that she is noticing discomfort if she wears certain shoes to the area with pressure.  Denies any opening, redness or drainage.  She has no other concerns today. Denies any systemic complaints such as fevers, chills, nausea, vomiting. No acute changes since last appointment, and no other complaints at this time.   Objective: AAO x3, NAD DP/PT pulses palpable bilaterally, CRT less than 3 seconds Plantar aspect the right foot on the incision for the prior surgery there is hyperkeratotic tissue.  There is no underlying ulceration drainage or signs of infection upon debridement. No pain with calf compression, swelling, warmth, erythema  Assessment: Hyperkeratotic lesion along incision right foot  Plan: -All treatment options discussed with the patient including all alternatives, risks, complications.  -Lesion sharply debride without any complications or bleeding.  Prescribed urea cream.  Offloading. -Patient encouraged to call the office with any questions, concerns, change in symptoms.   [name] DPM

## 2020-10-24 ENCOUNTER — Other Ambulatory Visit: Payer: Self-pay

## 2020-10-24 ENCOUNTER — Emergency Department
Admission: RE | Admit: 2020-10-24 | Discharge: 2020-10-24 | Disposition: A | Discharge status: Home / Self Care | Payer: 59 | Source: Ambulatory Visit | Attending: Family Medicine | Admitting: Family Medicine

## 2020-10-24 VITALS — BP 147/83 | HR 92 | Temp 97.8°F | Resp 16 | Ht 68.5 in | Wt 165.0 lb

## 2020-10-24 DIAGNOSIS — J069 Acute upper respiratory infection, unspecified: Secondary | ICD-10-CM | POA: Diagnosis not present

## 2020-10-24 MED ORDER — ACETAMINOPHEN 500 MG PO TABS
1000.0000 mg | ORAL_TABLET | Freq: Once | ORAL | Status: AC
  Administered 2020-10-24: 1000 mg via ORAL

## 2020-10-24 MED ORDER — DOXYCYCLINE HYCLATE 100 MG PO CAPS
ORAL_CAPSULE | ORAL | 0 refills | Status: DC
Start: 2020-10-24 — End: 2021-01-30

## 2020-10-24 NOTE — ED Provider Notes (Signed)
[facility]ia [facility]    CSN: [id number] Arrival date & time: 10/24/20  1346      History   Chief Complaint Chief Complaint  Patient presents with  . Nasal Congestion  . Headache    HPI Victoria Barr is a 50 y.o. female.   Three days ago patient developed sinus congestion, facial pressure, upper teeth aching, sore throat, fatigue, headache, myalgias, and chills.  She states that her left ear feels clogged.  She denies cough. She has a history of recurring sinusitis.  The history is provided by the patient.    Past Medical History:  Diagnosis Date  . Allergy   . Anxiety   . Arthritis   . Depression   . GERD (gastroesophageal reflux disease) 04/08/2016  . History of abnormal cervical Pap smear 2006, 2015   2015 Neg with + HR HPV, 09/2014 ASCUS with + HR HPV, colpo with LEEP 11/2014-LGSIL (CIN-I)  . Hyperlipidemia   . Intestinal malrotation   . MVP (mitral valve prolapse)    echo in 2012 did not show MVP but did have mild elongation of the AMVL  . Palpitations   . Pyelonephritis 03/2015   evaluated by urology (Dr. Karsten Ro)  . Recurrent UTI   . Tachycardia     Patient Active Problem List   Diagnosis Date Noted  . DDD (degenerative disc disease), lumbar 09/18/2019  . PMDD (premenstrual dysphoric disorder) 07/07/2019  . GERD (gastroesophageal reflux disease) 04/08/2016  . Precordial pain   . Abnormal stress test   . Vitamin D deficiency 05/23/2015  . Depression 05/23/2015  . Acute infection of nasal sinus 04/25/2015  . Bilateral leg pain 11/13/2014  . Smoker 05/02/2014  . LGSIL (low grade squamous intraepithelial dysplasia) 05/02/2014  . VIN I (vulvar intraepithelial neoplasia I) 05/02/2014  . Chest pain 11/30/2013  . Abnormal EKG 11/30/2013  . Palpitations   . MVP (mitral valve prolapse)   . Dysuria 06/09/2013    Past Surgical History:  Procedure Laterality Date  . APPENDECTOMY  1999  . CARDIAC CATHETERIZATION N/A 04/01/2016   Procedure: Left Heart  Cath and Coronary Angiography;  Surgeon: Burnell Blanks, MD;  Location: [facility];  Service: Cardiovascular;  Laterality: N/A;  . CERVICAL BIOPSY  W/ LOOP ELECTRODE EXCISION  11/28/14   LGSIL with negative margins  . COLPOSCOPY  2006, 2015   2006 LGSIL, normal since; 2015 LGSIL w/LEEP  . EXCISION MORTON'S NEUROMA Bilateral 2002  . FOOT SURGERY    . HEMORRHOID SURGERY    . INTESTINAL MALROTATION REPAIR  1999  . NASAL SINUS SURGERY    . TUBAL LIGATION      OB History    Gravida  1   Para  1   Term  1   Preterm  0   AB  0   Living  1     SAB  0   IAB  0   Ectopic  0   Multiple  0   Live Births  1            Home Medications    Prior to Admission medications   Medication Sig Start Date End Date Taking? Authorizing Provider  doxycycline (VIBRAMYCIN) 100 MG capsule Take one cap PO Q12hr with food. 10/24/20  Yes Kandra Nicolas, MD  diclofenac (VOLTAREN) 50 MG EC tablet Take 50 mg by mouth 2 (two) times daily.    [provider]  ibuprofen (ADVIL) 800 MG tablet Take 1 tablet (800  mg total) by mouth every 8 (eight) hours as needed. 07/30/20   Yisroel Ramming, Brook E, MD  urea (GORDONS UREA) 40 % CREA Apply 1 application topically daily. 09/05/20   Trula Slade, DPM    Family History Family History  Problem Relation Age of Onset  . Heart disease Mother   . Hypertension Mother   . Aneurysm Father 72       brain   . Hypertension Sister   . Heart disease Maternal Grandmother   . Cancer Paternal Grandmother   . Cancer Paternal Grandfather   . Breast cancer Maternal Aunt   . BRCA 1/2 Cousin        negative    Social History Social History   Tobacco Use  . Smoking status: Current Some Day Smoker    Packs/day: 0.50    Types: Cigarettes  . Smokeless tobacco: Never Used  . Tobacco comment: quit 1 year ago, sep 2013  Vaping Use  . Vaping Use: Former  Substance Use Topics  . Alcohol use: No    Alcohol/week: 0.0 standard  drinks    Comment: occasionally  . Drug use: No     Allergies   Imitrex [sumatriptan], Iohexol, Penicillins, Prozac [fluoxetine hcl], Sulfa antibiotics, Zoloft [sertraline hcl], Iodinated diagnostic agents, and Shellfish allergy   Review of Systems Review of Systems + sore throat No cough No pleuritic pain No wheezing + nasal congestion + post-nasal drainage + sinus pain/pressure No itchy/red eyes ? left earache No hemoptysis No SOB No fever, + chills No nausea No vomiting No abdominal pain No diarrhea No urinary symptoms No skin rash + fatigue + myalgias + headache Used OTC meds (decongestant) without relief   Physical Exam Triage Vital Signs ED Triage Vitals  Enc Vitals Group     BP 10/24/20 1406 (!) 147/83     Pulse Rate 10/24/20 1406 92     Resp 10/24/20 1406 16     Temp 10/24/20 1406 97.8 F (36.6 C)     Temp Source 10/24/20 1406 Oral     SpO2 10/24/20 1406 98 %     Weight 10/24/20 1407 165 lb (74.8 kg)     Height 10/24/20 1407 5' 8.5" (1.74 m)     Head Circumference --      Peak Flow --      Pain Score 10/24/20 1407 8     Pain Loc --      Pain Edu? --      Excl. in [place]? --    No data found.  Updated Vital Signs BP (!) 147/83 (BP Location: Right Arm)   Pulse 92   Temp 97.8 F (36.6 C) (Oral)   Resp 16   Ht 5' 8.5" (1.74 m)   Wt 74.8 kg   LMP 10/24/2020 (Exact Date)   SpO2 98%   BMI 24.72 kg/m   Visual Acuity Right Eye Distance:   Left Eye Distance:   Bilateral Distance:    Right Eye Near:   Left Eye Near:    Bilateral Near:     Physical Exam Nursing notes and Vital Signs reviewed. Appearance:  Patient appears stated age, and in no acute distress Eyes:  Pupils are equal, round, and reactive to light and accomodation.  Extraocular movement is intact.  Conjunctivae are not inflamed  Ears:  Canals normal.  Tympanic membranes normal.  Nose:  Congested turbinates, more pronounced on the left.  Maxillary sinus tenderness is present.   Pharynx:  Normal Neck:  Supple.  Mildly enlarged lateral nodes are present, tender to palpation on the left. Lungs:  Clear to auscultation.  Breath sounds are equal.  Moving air well. Heart:  Regular rate and rhythm without murmurs, rubs, or gallops.  Abdomen:  Nontender without masses or hepatosplenomegaly.  Bowel sounds are present.  No CVA or flank tenderness.  Extremities:  No edema.  Skin:  No rash present.   UC Treatments / Results  Labs (all labs ordered are listed, but only abnormal results are displayed) Labs Reviewed  NOVEL CORONAVIRUS, NAA    EKG   Radiology No results found.  Procedures Procedures (including critical care time)  Medications Ordered in UC Medications  acetaminophen (TYLENOL) tablet 1,000 mg (1,000 mg Oral Given 10/24/20 1413)    Initial Impression / Assessment and Plan / UC Course  I have reviewed the triage vital signs and the nursing notes.  Pertinent labs & imaging results that were available during my care of the patient were reviewed by me and considered in my medical decision making (see chart for details).    ?sinusitis.  Begin doxycycline. COVID19 PCR pending. Followup with Family Doctor if not improved in about 10 days.   Final Clinical Impressions(s) / UC Diagnoses   Final diagnoses:  Viral URI with cough     Discharge Instructions     Take plain guaifenesin (1200mg  extended release tabs such as Mucinex) twice daily, with plenty of water, for cough and congestion.  May add Pseudoephedrine (30mg , one or two every 4 to 6 hours) for sinus congestion.  Get adequate rest.   May use Afrin nasal spray (or generic oxymetazoline) each morning for about 5 days and then discontinue.  Also recommend using saline nasal spray several times daily and saline nasal irrigation (AYR is a common brand).  Use Flonase nasal spray each morning after using Afrin nasal spray and saline nasal irrigation. Try warm salt water gargles for sore throat.  Stop  all antihistamines for now, and other non-prescription cough/cold preparations. May take Delsym Cough Suppressant ("12 Hour Cough Relief") at bedtime for nighttime cough.   If your COVID-19 test is positive, isolate yourself for five days from the date of your testing.  At the end of five days you may end isolation if your symptoms have cleared or improved, and you have not had a fever for 24 hours. At this time you should wear a mask for five more days when you are around others.            ED Prescriptions    Medication Sig Dispense Auth. Provider   doxycycline (VIBRAMYCIN) 100 MG capsule Take one cap PO Q12hr with food. 20 capsule Kandra Nicolas, MD        Kandra Nicolas, MD 10/26/20 [phone]

## 2020-10-24 NOTE — Discharge Instructions (Addendum)
Take plain guaifenesin (1200mg  extended release tabs such as Mucinex) twice daily, with plenty of water, for cough and congestion.  May add Pseudoephedrine (30mg , one or two every 4 to 6 hours) for sinus congestion.  Get adequate rest.   May use Afrin nasal spray (or generic oxymetazoline) each morning for about 5 days and then discontinue.  Also recommend using saline nasal spray several times daily and saline nasal irrigation (AYR is a common brand).  Use Flonase nasal spray each morning after using Afrin nasal spray and saline nasal irrigation. Try warm salt water gargles for sore throat.  Stop all antihistamines for now, and other non-prescription cough/cold preparations. May take Delsym Cough Suppressant ("12 Hour Cough Relief") at bedtime for nighttime cough.   If your COVID-19 test is positive, isolate yourself for five days from the date of your testing.  At the end of five days you may end isolation if your symptoms have cleared or improved, and you have not had a fever for 24 hours. At this time you should wear a mask for five more days when you are around others.

## 2020-10-24 NOTE — ED Triage Notes (Signed)
Patient here day 3 of congestions and headache and facial pressure; denies fever; has had sinus infections in past and feels that this is what is going on. Has had first 2 covid vaccinations and due for booster in March. No OTCs since last night.

## 2020-10-26 LAB — SARS-COV-2, NAA 2 DAY TAT

## 2020-10-26 LAB — NOVEL CORONAVIRUS, NAA: SARS-CoV-2, NAA: NOT DETECTED

## 2021-01-13 ENCOUNTER — Other Ambulatory Visit (HOSPITAL_COMMUNITY): Payer: Self-pay

## 2021-01-13 MED FILL — Ibuprofen Tab 800 MG: ORAL | 30 days supply | Qty: 90 | Fill #0 | Status: AC

## 2021-01-19 MED FILL — Diclofenac Sodium Tab Delayed Release 75 MG: ORAL | 30 days supply | Qty: 60 | Fill #0 | Status: AC

## 2021-01-20 ENCOUNTER — Other Ambulatory Visit (HOSPITAL_COMMUNITY): Payer: Self-pay

## 2021-01-23 ENCOUNTER — Ambulatory Visit (INDEPENDENT_AMBULATORY_CARE_PROVIDER_SITE_OTHER): Payer: 59

## 2021-01-23 ENCOUNTER — Other Ambulatory Visit: Payer: Self-pay

## 2021-01-23 ENCOUNTER — Ambulatory Visit (INDEPENDENT_AMBULATORY_CARE_PROVIDER_SITE_OTHER): Payer: 59 | Admitting: Podiatry

## 2021-01-23 DIAGNOSIS — L989 Disorder of the skin and subcutaneous tissue, unspecified: Secondary | ICD-10-CM

## 2021-01-23 DIAGNOSIS — S90851S Superficial foreign body, right foot, sequela: Secondary | ICD-10-CM | POA: Diagnosis not present

## 2021-01-23 DIAGNOSIS — M79671 Pain in right foot: Secondary | ICD-10-CM | POA: Diagnosis not present

## 2021-01-23 DIAGNOSIS — R200 Anesthesia of skin: Secondary | ICD-10-CM

## 2021-01-23 DIAGNOSIS — G8918 Other acute postprocedural pain: Secondary | ICD-10-CM

## 2021-01-23 NOTE — Patient Instructions (Signed)
Keep the bandage on for 24 hours. At that time, remove and clean with soap and water. If it hurts or burns before 24 hours go ahead and remove the bandage and wash with soap and water. Keep the area clean. If there is any blistering cover with antibiotic ointment and a bandage. Monitor for any redness, drainage, or other signs of infection. Call the office if any are to occur. If you have any questions, please call the office at [phone].  

## 2021-01-29 NOTE — Progress Notes (Signed)
Subjective: 50 year old female presents the office today for concerns of discomfort to her right foot.  Since the surgery she is still having some discomfort as a callus that is formed on the bottom of her foot.  Denies any swelling.  Also she developed numbness in her big toe but this did not start until sometime after the surgery.  She states she has difficulty wearing shoes given the pain in the bottom of her foot.  No open lesions.  No swelling or redness. Denies any systemic complaints such as fevers, chills, nausea, vomiting. No acute changes since last appointment, and no other complaints at this time.   Objective: AAO x3, NAD DP/PT pulses palpable bilaterally, CRT less than 3 seconds On the plantar aspect of right foot on the scar from the surgery, puncture wounds and hyperkeratotic lesion.  Upon debridement there is no underlying ulceration drainage or any signs of infection.  Mild numbness to the medial aspect of the hallux.  Negative Tinel sign.  No other areas of numbness. No pain with calf compression, swelling, warmth, erythema  Assessment: 50 year old female hyperkeratotic lesion right foot; numbness hallux  Plan: -All treatment options discussed with the patient including all alternatives, risks, complications.  -X-rays obtained reviewed.  No evidence of acute fracture or foreign body. -Debrided the hyperkeratotic lesion without any complications or bleeding.  Skin was cut with alcohol and a pad was placed followed by salicylic acid and a bandage.  Post procedure instructions discussed. -I told that the numbness of the hallux is coming from the surgery.  We will need to monitor this but it does not to be worsening.  [name] DPM

## 2021-01-30 ENCOUNTER — Emergency Department (HOSPITAL_BASED_OUTPATIENT_CLINIC_OR_DEPARTMENT_OTHER): Payer: 59

## 2021-01-30 ENCOUNTER — Other Ambulatory Visit: Payer: Self-pay

## 2021-01-30 ENCOUNTER — Emergency Department (HOSPITAL_BASED_OUTPATIENT_CLINIC_OR_DEPARTMENT_OTHER)
Admission: EM | Admit: 2021-01-30 | Discharge: 2021-01-30 | Disposition: A | Discharge status: Home / Self Care | Payer: 59 | Attending: Emergency Medicine | Admitting: Emergency Medicine

## 2021-01-30 ENCOUNTER — Encounter (HOSPITAL_BASED_OUTPATIENT_CLINIC_OR_DEPARTMENT_OTHER): Payer: Self-pay | Admitting: *Deleted

## 2021-01-30 DIAGNOSIS — R509 Fever, unspecified: Secondary | ICD-10-CM | POA: Diagnosis not present

## 2021-01-30 DIAGNOSIS — R300 Dysuria: Secondary | ICD-10-CM | POA: Diagnosis not present

## 2021-01-30 DIAGNOSIS — R110 Nausea: Secondary | ICD-10-CM | POA: Insufficient documentation

## 2021-01-30 DIAGNOSIS — F17210 Nicotine dependence, cigarettes, uncomplicated: Secondary | ICD-10-CM | POA: Diagnosis not present

## 2021-01-30 DIAGNOSIS — R1030 Lower abdominal pain, unspecified: Secondary | ICD-10-CM | POA: Diagnosis not present

## 2021-01-30 DIAGNOSIS — K219 Gastro-esophageal reflux disease without esophagitis: Secondary | ICD-10-CM | POA: Diagnosis not present

## 2021-01-30 LAB — URINALYSIS, ROUTINE W REFLEX MICROSCOPIC
Bilirubin Urine: NEGATIVE
Glucose, UA: NEGATIVE mg/dL
Ketones, ur: NEGATIVE mg/dL
Leukocytes,Ua: NEGATIVE
Nitrite: NEGATIVE
Protein, ur: NEGATIVE mg/dL
Specific Gravity, Urine: 1.014 (ref 1.005–1.030)
pH: 5.5 (ref 5.0–8.0)

## 2021-01-30 LAB — BASIC METABOLIC PANEL
Anion gap: 10 (ref 5–15)
BUN: 11 mg/dL (ref 6–20)
CO2: 26 mmol/L (ref 22–32)
Calcium: 9.9 mg/dL (ref 8.9–10.3)
Chloride: 104 mmol/L (ref 98–111)
Creatinine, Ser: 0.72 mg/dL (ref 0.44–1.00)
GFR, Estimated: >60 mL/min (ref >60)
Glucose, Bld: 90 mg/dL (ref 70–99)
Potassium: 3.9 mmol/L (ref 3.5–5.1)
Sodium: 140 mmol/L (ref 135–145)

## 2021-01-30 LAB — CBC
HCT: 45.6 % (ref 36.0–46.0)
Hemoglobin: 15 g/dL (ref 12.0–15.0)
MCH: 29.9 pg (ref 26.0–34.0)
MCHC: 32.9 g/dL (ref 30.0–36.0)
MCV: 91 fL (ref 80.0–100.0)
Platelets: 287 10*3/uL (ref 150–400)
RBC: 5.01 MIL/uL (ref 3.87–5.11)
RDW: 13.2 % (ref 11.5–15.5)
WBC: 7.6 10*3/uL (ref 4.0–10.5)
nRBC: 0 % (ref 0.0–0.2)

## 2021-01-30 LAB — PREGNANCY, URINE: Preg Test, Ur: NEGATIVE

## 2021-01-30 MED ORDER — NITROFURANTOIN MONOHYD MACRO 100 MG PO CAPS: 100.0000 mg | ORAL_CAPSULE | Freq: Two times a day (BID) | ORAL | 0 refills | Status: AC

## 2021-01-30 MED ORDER — PHENAZOPYRIDINE HCL 100 MG PO TABS
95.0000 mg | ORAL_TABLET | Freq: Once | ORAL | Status: AC
  Administered 2021-01-30: 100 mg via ORAL
  Filled 2021-01-30: qty 1

## 2021-01-30 MED ORDER — KETOROLAC TROMETHAMINE 30 MG/ML IJ SOLN
30.0000 mg | Freq: Once | INTRAMUSCULAR | Status: AC
  Administered 2021-01-30: 30 mg via INTRAVENOUS
  Filled 2021-01-30: qty 1

## 2021-01-30 NOTE — ED Triage Notes (Signed)
Left flank pain since Tuesday.  Was in UC at [facility] and was advised to come here.  Denies fever and vomiting but very nauseous.

## 2021-01-30 NOTE — ED Provider Notes (Signed)
[facility] EMERGENCY DEPT Provider Note   CSN: [id number] Arrival date & time: 01/30/21  1112     History Chief Complaint  Patient presents with  . Flank Pain    Victoria Barr is a 50 y.o. female.  The history is provided by the patient.  Dysuria Pain quality:  Burning Pain severity:  Moderate Onset quality:  Gradual Duration:  2 days Timing:  Constant Progression:  Worsening Chronicity:  New Recent urinary tract infections: no   Relieved by:  Nothing Worsened by:  Nothing Ineffective treatments:  NSAIDs Urinary symptoms: frequent urination   Urinary symptoms: no hematuria   Associated symptoms: abdominal pain (suprapubic pressure), fever (subjective) and nausea   Associated symptoms: no flank pain (has lower back pain and hurts all across her back), no genital lesions, no vaginal discharge and no vomiting   Risk factors: hx of pyelonephritis   Risk factors: no hx of urolithiasis, not sexually active and no urinary catheter        Past Medical History:  Diagnosis Date  . Allergy   . Anxiety   . Arthritis   . Depression   . GERD (gastroesophageal reflux disease) 04/08/2016  . History of abnormal cervical Pap smear 2006, 2015   2015 Neg with + HR HPV, 09/2014 ASCUS with + HR HPV, colpo with LEEP 11/2014-LGSIL (CIN-I)  . Hyperlipidemia   . Intestinal malrotation   . MVP (mitral valve prolapse)    echo in 2012 did not show MVP but did have mild elongation of the AMVL  . Palpitations   . Pyelonephritis 03/2015   evaluated by urology (Dr. Karsten Ro)  . Recurrent UTI   . Tachycardia     Patient Active Problem List   Diagnosis Date Noted  . DDD (degenerative disc disease), lumbar 09/18/2019  . PMDD (premenstrual dysphoric disorder) 07/07/2019  . GERD (gastroesophageal reflux disease) 04/08/2016  . Precordial pain   . Abnormal stress test   . Vitamin D deficiency 05/23/2015  . Depression 05/23/2015  . Acute infection of nasal sinus 04/25/2015  .  Bilateral leg pain 11/13/2014  . Smoker 05/02/2014  . LGSIL (low grade squamous intraepithelial dysplasia) 05/02/2014  . VIN I (vulvar intraepithelial neoplasia I) 05/02/2014  . Chest pain 11/30/2013  . Abnormal EKG 11/30/2013  . Palpitations   . MVP (mitral valve prolapse)   . Dysuria 06/09/2013    Past Surgical History:  Procedure Laterality Date  . APPENDECTOMY  1999  . CARDIAC CATHETERIZATION N/A 04/01/2016   Procedure: Left Heart Cath and Coronary Angiography;  Surgeon: Burnell Blanks, MD;  Location: [facility];  Service: Cardiovascular;  Laterality: N/A;  . CERVICAL BIOPSY  W/ LOOP ELECTRODE EXCISION  11/28/14   LGSIL with negative margins  . COLPOSCOPY  2006, 2015   2006 LGSIL, normal since; 2015 LGSIL w/LEEP  . EXCISION MORTON'S NEUROMA Bilateral 2002  . FOOT SURGERY    . HEMORRHOID SURGERY    . INTESTINAL MALROTATION REPAIR  1999  . NASAL SINUS SURGERY    . TUBAL LIGATION       OB History    Gravida  1   Para  1   Term  1   Preterm  0   AB  0   Living  1     SAB  0   IAB  0   Ectopic  0   Multiple  0   Live Births  1  Family History  Problem Relation Age of Onset  . Heart disease Mother   . Hypertension Mother   . Aneurysm Father 58       brain   . Hypertension Sister   . Heart disease Maternal Grandmother   . Cancer Paternal Grandmother   . Cancer Paternal Grandfather   . Breast cancer Maternal Aunt   . BRCA 1/2 Cousin        negative    Social History   Tobacco Use  . Smoking status: Current Some Day Smoker    Packs/day: 0.50    Types: Cigarettes  . Smokeless tobacco: Never Used  . Tobacco comment: 5 cigarettes a day  Vaping Use  . Vaping Use: Every day  Substance Use Topics  . Alcohol use: Yes    Alcohol/week: 0.0 standard drinks    Comment: occasionally  . Drug use: No    Home Medications Prior to Admission medications   Medication Sig Start Date End Date Taking? Authorizing Provider   diclofenac (VOLTAREN) 50 MG EC tablet Take 50 mg by mouth 2 (two) times daily.   Yes [provider]  diclofenac (VOLTAREN) 75 MG EC tablet TAKE 1 TABLET BY MOUTH TWICE DAILY AS NEEDED. 02/02/20 02/23/21 Yes Berle Mull, MD  ibuprofen (ADVIL) 800 MG tablet TAKE 1 TABLET (800 MG TOTAL) BY MOUTH EVERY 8 (EIGHT) HOURS AS NEEDED. 07/30/20 07/30/21 Yes Nunzio Cobbs, MD  loratadine (CLARITIN) 10 MG tablet Take 10 mg by mouth daily.   Yes [provider]  urea (GORDONS UREA) 40 % CREA Apply 1 application topically daily. 09/05/20  Yes Trula Slade, DPM    Allergies    Imitrex [sumatriptan], Iohexol, Penicillins, Prozac [fluoxetine hcl], Sulfa antibiotics, Zoloft [sertraline hcl], Iodinated diagnostic agents, and Shellfish allergy  Review of Systems   Review of Systems  Constitutional: Positive for fever (subjective). Negative for chills.  HENT: Negative for ear pain and sore throat.   Eyes: Negative for pain and visual disturbance.  Respiratory: Negative for cough and shortness of breath.   Cardiovascular: Negative for chest pain and palpitations.  Gastrointestinal: Positive for abdominal pain (suprapubic pressure) and nausea. Negative for vomiting.  Genitourinary: Positive for dysuria. Negative for flank pain (has lower back pain and hurts all across her back), hematuria and vaginal discharge.  Musculoskeletal: Negative for arthralgias and back pain.  Skin: Negative for color change and rash.  Neurological: Negative for seizures and syncope.  All other systems reviewed and are negative.   Physical Exam Updated Vital Signs BP 138/80 (BP Location: Right Arm)   Pulse 87   Temp 98.6 F (37 C) (Oral)   Resp 16   Ht 5' 8.5" (1.74 m)   Wt 71.2 kg   LMP 01/09/2021   SpO2 100%   BMI 23.52 kg/m   Physical Exam Vitals and nursing note reviewed.  Constitutional:      General: She is not in acute distress.    Appearance: She is well-developed.  HENT:      Head: Normocephalic and atraumatic.  Eyes:     Conjunctiva/sclera: Conjunctivae normal.  Cardiovascular:     Rate and Rhythm: Normal rate and regular rhythm.     Heart sounds: No murmur heard.   Pulmonary:     Effort: Pulmonary effort is normal. No respiratory distress.     Breath sounds: Normal breath sounds.  Abdominal:     Palpations: Abdomen is soft.     Tenderness: There is no abdominal tenderness.  There is no right CVA tenderness or left CVA tenderness.  Musculoskeletal:     Cervical back: Neck supple.  Skin:    General: Skin is warm and dry.  Neurological:     General: No focal deficit present.     Mental Status: She is alert.  Psychiatric:        Mood and Affect: Mood normal.     ED Results / Procedures / Treatments   Labs (all labs ordered are listed, but only abnormal results are displayed) Labs Reviewed  URINALYSIS, ROUTINE W REFLEX MICROSCOPIC - Abnormal; Notable for the following components:      Result Value   Hgb urine dipstick MODERATE (*)    Bacteria, UA FEW (*)    All other components within normal limits  BASIC METABOLIC PANEL  CBC  PREGNANCY, URINE    EKG None  Radiology CT Renal Stone Study  Result Date: 01/30/2021 CLINICAL DATA:  Left flank pain since Tuesday EXAM: CT ABDOMEN AND PELVIS WITHOUT CONTRAST TECHNIQUE: Multidetector CT imaging of the abdomen and pelvis was performed following the standard protocol without IV contrast. COMPARISON:  07/18/2015 FINDINGS: Lower chest: No acute abnormality. Hepatobiliary: No focal liver abnormality is seen. No gallstones, gallbladder wall thickening, or biliary dilatation. Pancreas: Unremarkable. No pancreatic ductal dilatation or surrounding inflammatory changes. Spleen: Normal in size without focal abnormality. Adrenals/Urinary Tract: No signal abnormality of the adrenal glands, kidneys, ureters. Evaluation the bladder is limited due to under distension. Stomach/Bowel: Postsurgical changes of appendectomy  are seen. No bowel dilatation to indicate ileus or obstruction. Vascular/Lymphatic: No significant vascular findings are present. No enlarged abdominal or pelvic lymph nodes. Reproductive: Uterus and bilateral adnexa are unremarkable. Other: No abdominal wall hernia or abnormality. No abdominopelvic ascites. Musculoskeletal: No acute or significant osseous findings. IMPRESSION: No acute abnormality of the abdomen or pelvis. Electronically Signed   By: Miachel Roux M.D.   On: 01/30/2021 13:41    Procedures Procedures   Medications Ordered in ED Medications  ketorolac (TORADOL) 30 MG/ML injection 30 mg (30 mg Intravenous Given 01/30/21 1151)  phenazopyridine (PYRIDIUM) tablet 100 mg (100 mg Oral Given 01/30/21 1151)    ED Course  I have reviewed the triage vital signs and the nursing notes.  Pertinent labs & imaging results that were available during my care of the patient were reviewed by me and considered in my medical decision making (see chart for details).    MDM Rules/Calculators/A&P                          MALLERIE BLOK presents to the ED complaining of dysuria, subjective fevers, and suprapubic pain.  She has a history of pyelonephritis.  She was very well-appearing upon arrival with normal vital signs.  Initial work-up was fairly reassuring.  Urinalysis reveals some blood and bacteria but no obvious UTI.  At this point, I reassessed the patient, and I did order a CT scan.  Her exam did not reveal pain localized to 1 flank or the other, and I did not think ultrasound will be helpful. I reasoned a CT scan would evaluated for alternative pathology aside from simply urologic problems. Imaging failed to show any obvious abnormalities.  I will treat her for a UTI given that she does have symptoms and her urine sample showed a few abnormalities.  I spoke to her on several occasions about whether or not to perform STI testing.  She states she has not had sexual  intercourse in 1 month, and she  declined testing for gonorrhea and chlamydia.  She was given careful return precautions and counseled to follow-up with OB/GYN if she failed to improve. Final Clinical Impression(s) / ED Diagnoses Final diagnoses:  Dysuria    Rx / DC Orders ED Discharge Orders         Ordered    nitrofurantoin, macrocrystal-monohydrate, (MACROBID) 100 MG capsule  2 times daily        01/30/21 1357           Arnaldo Natal, MD 01/30/21 1407

## 2021-02-11 ENCOUNTER — Ambulatory Visit (INDEPENDENT_AMBULATORY_CARE_PROVIDER_SITE_OTHER): Payer: 59 | Admitting: Internal Medicine

## 2021-02-11 VITALS — BP 116/73 | HR 89 | Temp 97.7°F | Ht 69.0 in | Wt 160.2 lb

## 2021-02-11 DIAGNOSIS — J320 Chronic maxillary sinusitis: Secondary | ICD-10-CM

## 2021-02-11 DIAGNOSIS — E782 Mixed hyperlipidemia: Secondary | ICD-10-CM | POA: Diagnosis not present

## 2021-02-11 DIAGNOSIS — E559 Vitamin D deficiency, unspecified: Secondary | ICD-10-CM | POA: Diagnosis not present

## 2021-02-11 DIAGNOSIS — R300 Dysuria: Secondary | ICD-10-CM

## 2021-02-11 DIAGNOSIS — Z131 Encounter for screening for diabetes mellitus: Secondary | ICD-10-CM

## 2021-02-11 DIAGNOSIS — F17200 Nicotine dependence, unspecified, uncomplicated: Secondary | ICD-10-CM

## 2021-02-11 DIAGNOSIS — F3281 Premenstrual dysphoric disorder: Secondary | ICD-10-CM

## 2021-02-11 DIAGNOSIS — G43009 Migraine without aura, not intractable, without status migrainosus: Secondary | ICD-10-CM

## 2021-02-11 DIAGNOSIS — F32A Depression, unspecified: Secondary | ICD-10-CM

## 2021-02-11 LAB — POCT GLYCOSYLATED HEMOGLOBIN (HGB A1C): Hemoglobin A1C: 5.4 % (ref 4.0–5.6)

## 2021-02-11 LAB — GLUCOSE, CAPILLARY: Glucose-Capillary: 97 mg/dL (ref 70–99)

## 2021-02-11 NOTE — Progress Notes (Signed)
CC: Frequent dysuria; establish care  HPI:  Victoria Barr is a 50 y.o. with a PMHx as listed below who presents to the clinic for Frequent dysuria; establish care.   Please see the Encounters tab for problem-based Assessment & Plan regarding status of patient's acute and chronic conditions.  Past Medical History:  Diagnosis Date  . Allergy   . Anxiety   . Arthritis   . Depression   . GERD (gastroesophageal reflux disease) 04/08/2016  . History of abnormal cervical Pap smear 2006, 2015   2015 Neg with + HR HPV, 09/2014 ASCUS with + HR HPV, colpo with LEEP 11/2014-LGSIL ([facility]-I)  . Hyperlipidemia   . Intestinal malrotation   . MVP (mitral valve prolapse)    echo in 2012 did not show MVP but did have mild elongation of the AMVL  . Palpitations   . Pyelonephritis 03/2015   evaluated by urology (Dr. Karsten Ro)  . Recurrent UTI   . Tachycardia    Past Surgical History:  Past Surgical History:  Procedure Laterality Date  . APPENDECTOMY  1999  . CARDIAC CATHETERIZATION N/A 04/01/2016   Procedure: Left Heart Cath and Coronary Angiography;  Surgeon: Burnell Blanks, MD;  Location: [place] [facility];  Service: Cardiovascular;  Laterality: N/A;  . CERVICAL BIOPSY  W/ LOOP ELECTRODE EXCISION  11/28/14   LGSIL with negative margins  . COLPOSCOPY  2006, 2015   2006 LGSIL, normal since; 2015 LGSIL w/LEEP  . EXCISION MORTON'S NEUROMA Bilateral 2002  . FOOT SURGERY    . HEMORRHOID SURGERY    . INTESTINAL MALROTATION REPAIR  1999  . NASAL SINUS SURGERY    . TUBAL LIGATION     Past Family History:  Family History  Problem Relation Age of Onset  . Heart disease Mother   . Hypertension Mother   . Aneurysm Father 61       brain   . Hypertension Sister   . Heart disease Maternal Grandmother   . Cancer Paternal Grandmother   . Cancer Paternal Grandfather   . Breast cancer Maternal Aunt   . BRCA 1/2 Cousin        negative   Social History:  Lives in [place] with her  son Works as a Quarry manager for [employer] Currently smokes half a pack of cigarettes per day with some vaping as well Denies any alcohol or drug use.   Review of Systems: Review of Systems  Constitutional: Negative for chills, fever and weight loss.  HENT: Positive for congestion and sinus pain. Negative for sore throat.   Respiratory: Negative for shortness of breath and wheezing.   Cardiovascular: Negative for chest pain, palpitations and leg swelling.  Gastrointestinal: Negative for abdominal pain, diarrhea, nausea and vomiting.  Genitourinary: Negative for dysuria and urgency.  Neurological: Negative for dizziness and headaches.  Psychiatric/Behavioral: Positive for depression. Negative for suicidal ideas. The patient is not nervous/anxious.    Physical Exam:  Vitals:   02/11/21 1411  BP: 116/73  Pulse: 89  Temp: 97.7 F (36.5 C)  TempSrc: Oral  SpO2: 100%  Weight: 160 lb 3.2 oz (72.7 kg)  Height: _0  (1.753 m)   Physical Exam Vitals and nursing note reviewed.  Constitutional:      General: She is not in acute distress.    Appearance: She is normal weight.  HENT:     Head: Normocephalic and atraumatic.     Right Ear: No swelling or tenderness. A middle ear effusion is present. There  is no impacted cerumen. Tympanic membrane is bulging. Tympanic membrane is not injected, scarred, perforated, erythematous or retracted.     Left Ear: No drainage, swelling or tenderness.  No middle ear effusion. There is no impacted cerumen. Tympanic membrane is scarred and bulging. Tympanic membrane is not injected, perforated, erythematous or retracted.     Ears:     Comments: External canal trauma, mild, right Cardiovascular:     Rate and Rhythm: Normal rate and regular rhythm.     Heart sounds: No murmur heard. No gallop.   Pulmonary:     Effort: Pulmonary effort is normal. No respiratory distress.     Breath sounds: Normal breath sounds. No wheezing or rales.  Abdominal:      General: Bowel sounds are normal.     Palpations: Abdomen is soft.  Skin:    General: Skin is warm and dry.     Comments: Area of hyperpigmentation on the lateral aspect of the right abdomen. Non-tender. No papules or macules.   Neurological:     General: No focal deficit present.     Mental Status: She is alert and oriented to person, place, and time. Mental status is at baseline.     Gait: Gait normal.  Psychiatric:        Mood and Affect: Mood normal.        Behavior: Behavior normal.        Thought Content: Thought content normal.        Judgment: Judgment normal.     Assessment & Plan:   See Encounters Tab for problem based charting.  Patient discussed with Dr. Evette Doffing

## 2021-02-11 NOTE — Patient Instructions (Addendum)
It was nice seeing you today! Thank you for choosing [facility] for your Primary Care.    Today we talked about:   1. Sinus Congestion: Continue taking Claritin and starting using Flonase daily.   2. Pain with urination and history of frequent UTI's: Let us know if these symptoms return!   3. Lab work today: I will check your vitamin D levels, A1c to screen for diabetes, and lipid panel to check your cholesterol.

## 2021-02-12 DIAGNOSIS — E782 Mixed hyperlipidemia: Secondary | ICD-10-CM | POA: Insufficient documentation

## 2021-02-12 DIAGNOSIS — G43909 Migraine, unspecified, not intractable, without status migrainosus: Secondary | ICD-10-CM | POA: Insufficient documentation

## 2021-02-12 DIAGNOSIS — J329 Chronic sinusitis, unspecified: Secondary | ICD-10-CM | POA: Insufficient documentation

## 2021-02-12 LAB — LIPID PANEL
Chol/HDL Ratio: 2.9 ratio (ref 0.0–4.4)
Cholesterol, Total: 229 mg/dL — ABNORMAL HIGH (ref 100–199)
HDL: 78 mg/dL (ref >39)
LDL Chol Calc (NIH): 138 mg/dL — ABNORMAL HIGH (ref 0–99)
Triglycerides: 73 mg/dL (ref 0–149)
VLDL Cholesterol Cal: 13 mg/dL (ref 5–40)

## 2021-02-12 LAB — VITAMIN D 25 HYDROXY (VIT D DEFICIENCY, FRACTURES): Vit D, 25-Hydroxy: 52.6 ng/mL (ref 30.0–100.0)

## 2021-02-12 NOTE — Assessment & Plan Note (Signed)
Lab Results  Component Value Date   CHOL 229 (H) 02/11/2021   HDL 78 02/11/2021   LDLCALC 138 (H) 02/11/2021   TRIG 73 02/11/2021   CHOLHDL 2.9 02/11/2021   Patient has a history of elevated LDL approximately 6 years ago.  Lipid panel has not been reevaluated since then.  Lipid panel obtained today does show elevation, however given patient does not have other comorbidities, no indication for statin therapy at this time.  Recommend lifestyle adjustment including decreasing red meat.

## 2021-02-12 NOTE — Assessment & Plan Note (Signed)
Patient states that she continues to smoke half a pack of cigarettes per day, but is very interested in quitting.  She has started to try to quit by switching to vaping.  She notes that in the past, she did use Wellbutrin to attempt to quit and it did work, but she restarted smoking after a stressful period of time.  Assessment/plan: Patient understands the necessity to quit smoking.  Counseled her that vaping can also be addictive and not the preferred method of quitting.  She expressed understanding.  Given prior poor reaction to medication she is not interested in trying Wellbutrin again at this time  - Continue working on quitting

## 2021-02-12 NOTE — Assessment & Plan Note (Signed)
Patient states she has a long-term history of depression for which she has received multiple types of treatment.  She previously was on Prozac and at one point Zoloft, however developed allergic reactions to both with hives.  She recently underwent transcranial magnetic stimulation therapy for which she says her anxiety improved significantly, but still feels her depression is active.  She denies any suicidal ideation at this time.  She has never done any type of therapy including CBT.  Assessment/plan: PHQ of 7 today.  I recommended CBT therapy in the future if her depression becomes uncontrolled.  And patient expressed understanding.  No further intervention at this time.  We will continue to monitor at future visits.

## 2021-02-12 NOTE — Assessment & Plan Note (Signed)
Victoria Barr endorses a long-term history of chronic dysuria with approximately 2 UTIs per year.  At one point, she did have pyelonephritis that required hospitalization and she states she was nearly septic at that time.  She has seen urology in the past who felt that there may have been correlation with intercourse.  Most recent UTI was approximately 2 weeks ago for which she was seen at urgent care and treated with antibiotics.  Patient denies any dysuria, urgency or frequency today.  Assessment/plan: Previous urine cultures were reviewed and most did not grow out or had insignificant growth.  Due to this, will be difficult to guide empiric antibiotics in the future.  I recommended next time she has symptoms and is concerned for UTI, to make a same-day appointment for urine cultures to be obtained.  If UTIs become more frequent, consider referral to urology for reevaluation.

## 2021-02-12 NOTE — Assessment & Plan Note (Signed)
Chronic diagnosis for which patient sees OB/GYN.  She has been tried on SSRIs in the past, but developed allergic reactions.  Not interested in additional medical therapy at this time.  Assessment/plan: -Given patient's prior allergic reactions to SSRIs, no plan to initiate at this time

## 2021-02-12 NOTE — Assessment & Plan Note (Signed)
Victoria Barr states that she has a prior history of vitamin D deficiency continues to take vitamin D supplements 2-3 times per week.  Assessment/plan: Vitamin D levels assessed during visit and within normal limits.  Vitamin D supplementation necessary at this time but if patient prefers to take, no contraindication.

## 2021-02-12 NOTE — Assessment & Plan Note (Signed)
Ms. Kyllo endorses a long-term history of chronic sinusitis that is often triggered by allergies.  She notes that usually at least once a year she does require antibiotics.  Currently at this time she is taking Claritin daily and finds that it may help some but not very much.  She also purchased some Flonase but was unsure on how to use it.  At this time she endorses sinus congestion with some feeling of fullness in her maxillary sinuses.  Assessment/plan: - Encouraged daily Claritin - Encouraged daily Flonase and discussed how I can be both treatment and preventative

## 2021-02-12 NOTE — Assessment & Plan Note (Signed)
Victoria Barr states that she has a long-term history of migraines and was previously controlled with Imitrex.  Unfortunately she did end up developing an anaphylactic reaction to Imitrex with shortness of breath, throat swelling and hives and at this time is no longer able to tolerate triptan therapy.  She notes that her migraines are typically triggered by her menstrual cycles.  She is not currently having a migraine at this time.  Assessment/plan: We initially discussed consideration of referral for neurology for known oral medication therapy including Botox injections given frequency of headaches (monthly).  Patient initially declined.  I recommended she continue to use Tylenol and ibuprofen as needed for migraines.  ADDENDUM:  When I contacted patient to discuss her lab results, she notes that she developed a migraine today.  On further consideration, she would like a referral to neurology for further evaluation and consideration of advanced therapy.

## 2021-02-13 ENCOUNTER — Encounter: Payer: Self-pay | Admitting: Neurology

## 2021-02-13 NOTE — Progress Notes (Signed)
Internal Medicine Clinic Attending  Case discussed with Dr. Basaraba  At the time of the visit.  We reviewed the resident's history and exam and pertinent patient test results.  I agree with the assessment, diagnosis, and plan of care documented in the resident's note.  

## 2021-02-20 ENCOUNTER — Encounter: Payer: Self-pay | Admitting: Obstetrics and Gynecology

## 2021-03-25 ENCOUNTER — Encounter: Payer: Self-pay | Admitting: *Deleted

## 2021-04-08 ENCOUNTER — Ambulatory Visit (INDEPENDENT_AMBULATORY_CARE_PROVIDER_SITE_OTHER): Payer: 59 | Admitting: Sports Medicine

## 2021-04-08 ENCOUNTER — Other Ambulatory Visit: Payer: Self-pay

## 2021-04-08 ENCOUNTER — Other Ambulatory Visit (HOSPITAL_COMMUNITY): Payer: Self-pay

## 2021-04-08 VITALS — Ht 68.5 in | Wt 160.0 lb

## 2021-04-08 DIAGNOSIS — M545 Low back pain, unspecified: Secondary | ICD-10-CM | POA: Diagnosis not present

## 2021-04-08 DIAGNOSIS — M5136 Other intervertebral disc degeneration, lumbar region: Secondary | ICD-10-CM | POA: Diagnosis not present

## 2021-04-08 DIAGNOSIS — G8929 Other chronic pain: Secondary | ICD-10-CM | POA: Diagnosis not present

## 2021-04-08 MED ORDER — DICLOFENAC SODIUM 75 MG PO TBEC
75.0000 mg | DELAYED_RELEASE_TABLET | Freq: Two times a day (BID) | ORAL | 0 refills | Status: DC
  Filled 2021-04-08: qty 60, 30d supply, fill #0

## 2021-04-08 NOTE — Assessment & Plan Note (Signed)
50 yo patient presents with a long history of low back pain, history and exam most consistent with facet arthropathy. Patient has done many conservative interventions with minimal relief, recommend formal physical therapy as next step. Obtain updated lumbar radiographs. If no improvement, consider referral for injections. Follow up 4 weeks. - diclofenac 75 mg BID prn

## 2021-04-08 NOTE — Progress Notes (Signed)
    SUBJECTIVE:   CHIEF COMPLAINT / HPI: chronic low back pain  50 yo woman presents with chronic low back pain that has been intermittently present for 20+ years. Patient reports that her low back pain has become significantly worse in the last month. She has previously had severe back pain and is hoping to address it earlier and get relief. She has a remote history of PT (<5 years ago) with good outcome, and lately she has been doing gentle ROM exercises; she uses diclofenac 75 mg BID prn which provides some relief. Previously she saw Dr. Bernita Buffy at [facility] and previously had MRI there, which showed an L5-S annular fissure and minimal protrusion. Also previously had a TENS unit which provided some relief.   In 2020 she fell and was subsequently seen by Dr. Shon Baton at [facility], had MRI there and x-ray in 2020 that showed new facet arthropathy at L3-L4. She had 2 back injections which did provide her with relief. Unfortunately her insurance changed and she had to switch care.  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 She reports that her pain is in her low back and radiates to her thighs, it does not reach her knees. The pain is numbness, tingling, and discomfort, R>L. She denies saddle anesthesia and incontinence. Pain is worst when she first starts moving, not with any particular position.   PERTINENT  PMH / PSH: lumbar DDD  OBJECTIVE:   Ht 5' 8.5" (1.74 m)   Wt 160 lb (72.6 kg)   BMI 23.97 kg/m   Nursing note and vitals reviewed GEN: age-appropriate, WW, resting comfortably in chair, NAD, WNWD, alert and at baseline Lumbar spine: - Inspection: no gross deformity or asymmetry, swelling or ecchymosis. No skin changes - Palpation: + TTP over paraspinal muscles, no TTP over the spinous processes, or SI joints b/l - ROM: full active ROM of the lumbar spine in flexion w/o pain, very limited extension with pain - Strength: 5/5 strength of lower extremity in L4-S1 nerve root  distributions b/l - Neuro: sensation intact in the L4-S1 nerve root distribution b/l, 2+ L4 and S1 reflexes - Straight Leg Raise test: NEG Ext: no edema Psych: Pleasant and appropriate  ASSESSMENT/PLAN:   Chronic lumbar pain 50 yo patient presents with a long history of low back pain, history and exam most consistent with facet arthropathy. Patient has done many conservative interventions with minimal relief, recommend formal physical therapy as next step. Obtain updated lumbar radiographs. If no improvement, consider referral for injections. Follow up 4 weeks. - diclofenac 75 mg BID prn      Shirlean Mylar, MD [facility]

## 2021-04-08 NOTE — Patient Instructions (Addendum)
It was a pleasure to see you today!  For your back pain:  - Continue to use diclofenac as needed for your pain, and ice/heat.  - Please go get a new x-ray of your back at [facility] at your earliest convenience - Bring the disc with the MRI of your back at your earliest convenience - I placed a referral for physical therapy, you should receive a call in 1-2 weeks to set this up. If you have any problems, please reach out to our office and we can help facilitate this. - If your pain worsens or is not tolerable before your next follow up, please reach out to [place] to let us know. - Follow up in 4 weeks  Be Well,  Dr. Leary Roca

## 2021-04-11 ENCOUNTER — Other Ambulatory Visit: Payer: Self-pay

## 2021-04-11 ENCOUNTER — Ambulatory Visit
Admission: RE | Admit: 2021-04-11 | Discharge: 2021-04-11 | Disposition: A | Discharge status: Home / Self Care | Payer: 59 | Source: Ambulatory Visit | Attending: Sports Medicine | Admitting: Sports Medicine

## 2021-04-11 DIAGNOSIS — M5136 Other intervertebral disc degeneration, lumbar region: Secondary | ICD-10-CM

## 2021-04-14 ENCOUNTER — Encounter: Payer: Self-pay | Admitting: Internal Medicine

## 2021-05-05 NOTE — Progress Notes (Signed)
NEUROLOGY CONSULTATION NOTE  Victoria Barr MRN: [id number] DOB: 1970/10/03  Referring provider: Jose Persia, MD Primary care provider: Jose Persia, MD  Reason for consult:  migraine  Assessment/Plan:   Chronic migraine without aura, without status migrainosus, not intractable  Migraine prevention:  Plan to start a CGRP inhibitor.  She will contact her insurance to find out preferred medication (Emgality, Aimovig, Ajovy).  She has already tried metoprolol and amitriptyline.   Migraine rescue:  I will have her try Ubrelvy.  Triptans are contraindicated due to past history of anaphylaxis. Limit use of pain relievers to no more than 2 days out of week to prevent risk of rebound or medication-overuse headache. Keep headache diary Follow up 6 months.    Subjective:  Victoria Barr is a 50 year old left-handed female with Depression who presents for migraines.  History supplemented by referring provider's note.  Onset:  her early 62s. Have gotten worse over past few years. Location:  diffuse but may be unilateral  Quality:  pressure Intensity:  varies but has been 8.5/10.   Aura:  absent Prodrome:  absent Associated symptoms:  Nausea, photophobia, phonophobia, osmophobia.  She denies associated vomiting, visual disturbance, autonomic symptoms, unilateral numbness or weakness. Duration:  Wakes up with them.  Lasts all day. Frequency:  1 to 2 days a month Frequency of abortive medication: 1 to 2 days a month.  However she has 10-12 "sinus headaches" a month Triggers:  menstrual cycle, change in weather/barometric pressure Relieving factors:  no Activity:  aggravates Frequency of analgesics/NSAIDs:  ibuprofen for headaches during her period; diclofenac daily for back pain Current NSAIDS/analgesics:  ibuprofen 823m, Excedrin Migraine, diclofenac 770mBID  Current triptans:  none Current ergotamine:  none Current anti-emetic:  none Current muscle relaxants:   none Current Antihypertensive medications:  none Current Antidepressant medications:  none Current Anticonvulsant medications:  none Current anti-CGRP:  none Current Vitamins/Herbal/Supplements:  cranberry, cholecalciferol Current Antihistamines/Decongestants:  [employer] decongestant Other therapy:  none Hormone/birth control:  none  Past NSAIDS/analgesics:  tramadol, naproxen, meloxicam Past abortive triptans:  sumatriptan (anaphylaxis) Past abortive ergotamine:  none Past muscle relaxants:  none Past anti-emetic:  Reglan Past antihypertensive medications:  Metoprolol, HCTZ Past antidepressant medications:  amitriptyline, sertraline, Prozac, Wellbutrin, trazodone Past anticonvulsant medications:  gabapentin Past anti-CGRP:  none Past vitamins/Herbal/Supplements:  none Past antihistamines/decongestants:  Claritin, Zyrtec, Flonase, Sudafed Other past therapies:  none  CT head on 03/21/2020 personally reviewed was normal  Caffeine:  2 cups of coffee daily.  Occasional Coke Diet:  32 to 48 oz water daily.  Skips meals Exercise:  not routine Depression:  yes; Anxiety:  yes Other pain:  back pain Sleep hygiene:  poor.  Goes to bed late and gets up early.  May take Benadryl a couple of nights a week.  6 hours a night.  Does not feel rested. Family history:  father (ruptured cerebral aneurysm - checked for aneurysm when she was a teenager), sister (migraines)   PAST MEDICAL HISTORY: Past Medical History:  Diagnosis Date   Allergy    Anxiety    Arthritis    Depression    GERD (gastroesophageal reflux disease) 04/08/2016   History of abnormal cervical Pap smear 2006, 2015   2015 Neg with + HR HPV, 09/2014 ASCUS with + HR HPV, colpo with LEEP 11/2014-LGSIL (CIN-I)   Hyperlipidemia    Intestinal malrotation    MVP (mitral valve prolapse)    echo in 2012 did not show MVP but did have  mild elongation of the AMVL   Palpitations    Pyelonephritis 03/2015   evaluated by urology (Dr. Karsten Ro)    Recurrent UTI    Tachycardia     PAST SURGICAL HISTORY: Past Surgical History:  Procedure Laterality Date   APPENDECTOMY  1999   CARDIAC CATHETERIZATION N/A 04/01/2016   Procedure: Left Heart Cath and Coronary Angiography;  Surgeon: Burnell Blanks, MD;  Location: [place] [facility];  Service: Cardiovascular;  Laterality: N/A;   CERVICAL BIOPSY  W/ LOOP ELECTRODE EXCISION  11/28/14   LGSIL with negative margins   COLPOSCOPY  2006, 2015   2006 LGSIL, normal since; 2015 LGSIL w/LEEP   EXCISION MORTON'S NEUROMA Bilateral 2002   FOOT SURGERY     HEMORRHOID SURGERY     INTESTINAL MALROTATION REPAIR  1999   NASAL SINUS SURGERY     TUBAL LIGATION      MEDICATIONS: Current Outpatient Medications on File Prior to Visit  Medication Sig Dispense Refill   diclofenac (VOLTAREN) 75 MG EC tablet Take 1 tablet (75 mg total) by mouth 2 (two) times daily with food 60 tablet 0   ibuprofen (ADVIL) 800 MG tablet TAKE 1 TABLET (800 MG TOTAL) BY MOUTH EVERY 8 (EIGHT) HOURS AS NEEDED. 90 tablet 1   loratadine (CLARITIN) 10 MG tablet Take 10 mg by mouth daily.     urea (GORDONS UREA) 40 % CREA Apply 1 application topically daily. 85 g 1   No current facility-administered medications on file prior to visit.    ALLERGIES: Allergies  Allergen Reactions   Imitrex [Sumatriptan] Anaphylaxis and Hives   Iohexol Hives    Patient must pre medicated if having an exam with contrast per Dr. Maxwell// rls 11/01/19 Pt tolerates LESI w/o prep. [name], RN   Penicillins Hives    Has patient had a PCN reaction causing immediate rash, facial/tongue/throat swelling, SOB or lightheadedness with hypotension: Yes Has patient had a PCN reaction causing severe rash involving mucus membranes or skin necrosis: Yes Has patient had a PCN reaction that required hospitalization Yes Has patient had a PCN reaction occurring within the last 10 years: No If all of the above answers are "NO", then may proceed with Cephalosporin  use.    Prozac [Fluoxetine Hcl] Hives   Sulfa Antibiotics Hives   Zoloft [Sertraline Hcl] Hives   Iodinated Diagnostic Agents Hives   Shellfish Allergy Hives    FAMILY HISTORY: Family History  Problem Relation Age of Onset   Heart disease Mother    Hypertension Mother    Aneurysm Father 33       brain    Hypertension Sister    Heart disease Maternal Grandmother    Cancer Paternal Grandmother    Cancer Paternal Grandfather    Breast cancer Maternal Aunt    BRCA 1/2 Cousin        negative    Objective:  Blood pressure 127/80, pulse 78, height 5' 8.5" (1.74 m), weight 152 lb 12.8 oz (69.3 kg), SpO2 99 %. General: No acute distress.  Patient appears well-groomed.   Head:  Normocephalic/atraumatic Eyes:  fundi examined but not visualized Neck: supple, no paraspinal tenderness, full range of motion Back: No paraspinal tenderness Heart: regular rate and rhythm Lungs: Clear to auscultation bilaterally. Vascular: No carotid bruits. Neurological Exam: Mental status: alert and oriented to person, place, and time, recent and remote memory intact, fund of knowledge intact, attention and concentration intact, speech fluent and not dysarthric, language intact. Cranial nerves: CN  I: not tested CN II: pupils equal, round and reactive to light, visual fields intact CN III, IV, VI:  full range of motion, no nystagmus, no ptosis CN V: facial sensation intact. CN VII: upper and lower face symmetric CN VIII: hearing intact CN IX, X: gag intact, uvula midline CN XI: sternocleidomastoid and trapezius muscles intact CN XII: tongue midline Bulk & Tone: normal, no fasciculations. Motor:  muscle strength 5/5 throughout Sensation:  Pinprick, temperature and vibratory sensation intact. Deep Tendon Reflexes:  2+ throughout,  toes downgoing.   Finger to nose testing:  Without dysmetria.   Heel to shin:  Without dysmetria.   Gait:  Normal station and stride.  Romberg negative.    Thank you  for allowing me to take part in the care of this patient.  Metta Clines, DO  CC: Jose Persia, MD

## 2021-05-06 ENCOUNTER — Ambulatory Visit (INDEPENDENT_AMBULATORY_CARE_PROVIDER_SITE_OTHER): Payer: 59 | Admitting: Neurology

## 2021-05-06 ENCOUNTER — Ambulatory Visit (INDEPENDENT_AMBULATORY_CARE_PROVIDER_SITE_OTHER): Payer: 59 | Admitting: Sports Medicine

## 2021-05-06 ENCOUNTER — Encounter: Payer: Self-pay | Admitting: Neurology

## 2021-05-06 ENCOUNTER — Other Ambulatory Visit: Payer: Self-pay

## 2021-05-06 ENCOUNTER — Other Ambulatory Visit: Payer: Self-pay | Admitting: Sports Medicine

## 2021-05-06 VITALS — BP 127/80 | HR 78 | Ht 68.5 in | Wt 152.8 lb

## 2021-05-06 VITALS — BP 120/82 | Ht 68.5 in | Wt 158.0 lb

## 2021-05-06 DIAGNOSIS — M5136 Other intervertebral disc degeneration, lumbar region: Secondary | ICD-10-CM | POA: Diagnosis not present

## 2021-05-06 DIAGNOSIS — M545 Low back pain, unspecified: Secondary | ICD-10-CM | POA: Diagnosis not present

## 2021-05-06 DIAGNOSIS — G43709 Chronic migraine without aura, not intractable, without status migrainosus: Secondary | ICD-10-CM | POA: Diagnosis not present

## 2021-05-06 DIAGNOSIS — G8929 Other chronic pain: Secondary | ICD-10-CM

## 2021-05-06 NOTE — Patient Instructions (Signed)
  Contact your insurance to find out which of the following monthly injections would be covered:  Emgality, Aimovig, Ajovy Take Ubrelvy 100mg  at earliest onset of headache.  May repeat dose once in 2 hours if needed.  Maximum 2 tablets in 24 hours.  If effective, contact me for prescription Limit use of pain relievers to no more than 2 days out of the week.  These medications include acetaminophen, NSAIDs (ibuprofen/Advil/Motrin, naproxen/Aleve, triptans (Imitrex/sumatriptan), Excedrin, and narcotics.  This will help reduce risk of rebound headaches. Be aware of common food triggers:  - Caffeine:  coffee, black tea, cola, Mt. Dew  - Chocolate  - Dairy:  aged cheeses (brie, blue, cheddar, gouda, [place], provolone, [place], Swiss, etc), chocolate milk, buttermilk, sour cream, limit eggs and yogurt  - Nuts, peanut butter  - Alcohol  - Cereals/grains:  FRESH breads (fresh bagels, sourdough, doughnuts), yeast productions  - Processed/canned/aged/cured meats (pre-packaged deli meats, hotdogs)  - MSG/glutamate:  soy sauce, flavor enhancer, pickled/preserved/marinated foods  - Sweeteners:  aspartame (Equal, Nutrasweet).  Sugar and Splenda are okay  - Vegetables:  legumes (lima beans, lentils, snow peas, fava beans, pinto peans, peas, garbanzo beans), sauerkraut, onions, olives, pickles  - Fruit:  avocados, bananas, citrus fruit (orange, lemon, grapefruit), mango  - Other:  Frozen meals, macaroni and cheese Routine exercise Stay adequately hydrated (aim for 64 oz water daily) Keep headache diary Maintain proper stress management Maintain proper sleep hygiene Do not skip meals Consider supplements:  magnesium citrate 400mg  daily, riboflavin 400mg  daily, coenzyme Q10 100mg  three times daily.

## 2021-05-06 NOTE — Progress Notes (Signed)
PCP: Verdene Lennert, MD  Subjective:   HPI: Patient is a 50 y.o. female here for follow-up of chronic low back pain.  The patient's low back pain has been intermittently present for greater than 20 years.  She has tried multiple different modalities for her pain relief.  She had a ESI injection  on 11/13/19 that did provide significant relief of her back issues/pain.  She has also tried a TENS unit.  He recently, she has been icing, heating, and resting when her back flares up.  She feels like her back is "squished" and needs to be stretched out.  The pain is mostly localized to the low back, occasionally she will get some radiation down the anterior medial part of her thighs, but this does not last long.  She denies any radicular pain down the posterior or lateral part of the legs, no numbness tingling, or weakness of the lower extremities.  She has been taking diclofenac about once daily, this does help with the pain relief to a moderate amount.  She denies any new injuries since her last visit.  She has been unable to start physical therapy due to insurance issues and cost.  PMHx: lumbar DDD  Advanced imaging: MRI Lumbar spine (10/09/2019) demonstrates: - small right foraminal L3-L4 disc extrusion with mild cranial migration superimposed upon a right lateral bulging disc with impingement and mild left neuroforaminal narrowing -Mild bilateral L5-S1 neuroforaminal narrowing due to moderate disc bulge with mild disc height loss  BP 120/82   Ht 5' 8.5" (1.74 m)   Wt 158 lb (71.7 kg)   BMI 23.67 kg/m   No flowsheet data found.  No flowsheet data found.  Review of Systems: See HPI above.     Objective:  Physical Exam:  Gen: Well-appearing, in no acute distress; non-toxic CV: Regular Rate. Well-perfused. Warm.  Resp: Breathing unlabored on room air; no wheezing. Psych: Fluid speech in conversation; appropriate affect; normal thought process Neuro: Sensation intact throughout. No gross  coordination deficits.  MSK:  - Lumbar spine:  - Inspection: no gross deformity or scoliosis; no swelling or ecchymosis. No skin changes - Palpation: + Mild TTP over L5-S1 spinous processes and b/l SI joints; no significant TTP over paraspinal muscles - ROM: full active ROM of the lumbar spine in flexion and extension without pain - Strength: 5/5 strength of lower extremity in L4-S1 nerve root distributions b/l  *L1/L2: Hip Flexion & Abduction  *L3/L4: Knee Extension  *L4/L5: Ankle Dorsiflexion  *L5: Great Toe Extension  *S1: Ankle Plantar Flexion - Neuro: sensation intact in the L4-S1 nerve root distribution b/l, 2+ L4 and S1 reflexes - Provocative Testing: Negative straight leg raise, Modified Slump Test   Assessment & Plan:  1. Chronic Low Back Pain -patient's exam, imaging, and positive response to ESI points to discogenic pathology of the lumbar spine.  She is not having any sciatic-like pain or symptoms of nerve compression.   -Had a lengthy discussion with the patient on the importance of formal physical therapy for strengthening and stabilization of her core and low back for both acute improvement and longevity of her lumbar spine -Referral sent to [facility] imaging for repeat ESI injection -May continue diclofenac up to twice daily as needed for pain relief; otherwise may continue ice/heat/rest as able.  We did discuss the importance of maintaining healthy level of physical activity and certain exercises or motions to avoid -We will follow-up as needed or sooner if any issues arise  Victoria Brunner, DO  PGY-4, Sports Medicine Fellow [facility]   Patient seen and evaluated with the sports medicine fellow.  I agree with the above plan of care.  I was able to review the notes and MRI findings from [facility].  We will schedule a repeat lumbar ESI at [facility] imaging.  We have highly encouraged her to pursue physical therapy as ordered previously.  She  will follow-up with me as needed.

## 2021-05-06 NOTE — Patient Instructions (Signed)
It was great to see you today, thank you for letting me participate in your care!  Today, we discussed options for your low back. This seems to be coming from the IV discs in your back.  - give PT a try; formal PT until you become comfortable with the exercises, then you may continue these at home as needed.  This will be the biggest thing to help with improvement in the longevity of your back pain -We will send an order in for the epidural spinal injections, [place] imaging will call you to set this up -You may take diclofenac up to twice daily as needed for pain relief, ensure you take with food  You will follow-up as needed or if pain returns/worsens.  If you have any further questions, please give the clinic a call [phone].  Cheers,  Madelyn Brunner, DO PGY-4, Sports Medicine Fellow [facility]

## 2021-05-21 ENCOUNTER — Other Ambulatory Visit: Payer: 59

## 2021-06-04 ENCOUNTER — Other Ambulatory Visit: Payer: Self-pay | Admitting: Internal Medicine

## 2021-06-04 ENCOUNTER — Other Ambulatory Visit: Payer: Self-pay | Admitting: Family Medicine

## 2021-06-04 DIAGNOSIS — Z1231 Encounter for screening mammogram for malignant neoplasm of breast: Secondary | ICD-10-CM

## 2021-06-27 ENCOUNTER — Encounter: Payer: 59 | Admitting: Student

## 2021-07-11 ENCOUNTER — Ambulatory Visit
Admission: EM | Admit: 2021-07-11 | Discharge: 2021-07-11 | Disposition: A | Discharge status: Home / Self Care | Payer: 59 | Attending: Emergency Medicine | Admitting: Emergency Medicine

## 2021-07-11 ENCOUNTER — Other Ambulatory Visit (HOSPITAL_COMMUNITY): Payer: Self-pay

## 2021-07-11 DIAGNOSIS — R110 Nausea: Secondary | ICD-10-CM | POA: Diagnosis present

## 2021-07-11 DIAGNOSIS — J029 Acute pharyngitis, unspecified: Secondary | ICD-10-CM | POA: Insufficient documentation

## 2021-07-11 DIAGNOSIS — R0981 Nasal congestion: Secondary | ICD-10-CM | POA: Diagnosis present

## 2021-07-11 DIAGNOSIS — R6889 Other general symptoms and signs: Secondary | ICD-10-CM | POA: Insufficient documentation

## 2021-07-11 DIAGNOSIS — R197 Diarrhea, unspecified: Secondary | ICD-10-CM | POA: Insufficient documentation

## 2021-07-11 LAB — POCT INFLUENZA A/B
Influenza A, POC: NEGATIVE
Influenza B, POC: NEGATIVE

## 2021-07-11 LAB — POCT RAPID STREP A (OFFICE): Rapid Strep A Screen: NEGATIVE

## 2021-07-11 MED ORDER — CETIRIZINE HCL 10 MG PO TABS
10.0000 mg | ORAL_TABLET | Freq: Every day | ORAL | 0 refills | Status: DC
  Filled 2021-07-11: qty 30, 30d supply, fill #0

## 2021-07-11 MED ORDER — FLUTICASONE PROPIONATE 50 MCG/ACT NA SUSP
2.0000 | Freq: Every day | NASAL | 2 refills | Status: AC
  Filled 2021-07-11: qty 16, 30d supply, fill #0
  Filled 2021-09-01 – 2021-10-31 (×2): qty 16, 30d supply, fill #1
  Filled 2021-11-07: qty 16, 30d supply, fill #0

## 2021-07-11 MED ORDER — IPRATROPIUM BROMIDE 0.06 % NA SOLN
2.0000 | Freq: Four times a day (QID) | NASAL | 0 refills | Status: DC | PRN
  Filled 2021-07-11: qty 15, 30d supply, fill #0

## 2021-07-11 NOTE — ED Triage Notes (Signed)
Pt states for three days she had had nausea, diarrhea, fatigue and scratchy throat.

## 2021-07-11 NOTE — Discharge Instructions (Signed)
Please continue using your Nettie pot, Flonase and cetirizine tablets.  I recommend that you also add Atrovent nasal spray which contains an ingredient called ipratropium, this is commonly used for both allergies as well as rhinitis associated with upper respiratory infection.  You can also continue using ibuprofen as needed, anywhere from 400 to 800 mg depending on the severity of your sinus pressure and headache.  We will notify you of the results of your throat culture once they are available which usually takes 3 to 5 days.  We will also notify you of the results of your COVID-19 test which usually takes 6 to 12 hours.  Thank you for visiting urgent care and for better soon.

## 2021-07-11 NOTE — ED Provider Notes (Signed)
[facility]    CSN: [id number] Arrival date & time: 07/11/21  1309      History   Chief Complaint Chief Complaint  Patient presents with   Fatigue   Nausea   Diarrhea    HPI Victoria Barr is a 50 y.o. female.   Pt states for three days she had had nausea, diarrhea, fatigue and scratchy throat.  Patient reports subjective fever although she did not measure it.  Patient states that she does not have a history of frequent upper respiratory infections, states that her diarrhea has been minimal and she has not vomited.  Patient states she does not have much of an appetite.  Patient's blood pressure is elevated today, patient states she does not have a history of high blood pressure.  Patient is requesting testing for COVID and influenza today.  Patient states she admits to having anxiety about her health, states she just needs to know what she has.  The history is provided by the patient.   Past Medical History:  Diagnosis Date   Allergy    Anxiety    Arthritis    Depression    GERD (gastroesophageal reflux disease) 04/08/2016   History of abnormal cervical Pap smear 2006, 2015   2015 Neg with + HR HPV, 09/2014 ASCUS with + HR HPV, colpo with LEEP 11/2014-LGSIL (CIN-I)   Hyperlipidemia    Intestinal malrotation    MVP (mitral valve prolapse)    echo in 2012 did not show MVP but did have mild elongation of the AMVL   Palpitations    Pyelonephritis 03/2015   evaluated by urology (Dr. Karsten Ro)   Recurrent UTI    Tachycardia     Patient Active Problem List   Diagnosis Date Noted   Chronic lumbar pain 04/08/2021   Moderate mixed hyperlipidemia not requiring statin therapy 02/12/2021   Chronic sinusitis 02/12/2021   Migraines 02/12/2021   DDD (degenerative disc disease), lumbar 09/18/2019   PMDD (premenstrual dysphoric disorder) 07/07/2019   Vitamin D deficiency 05/23/2015   Depression 05/23/2015   Smoker 05/02/2014   LGSIL (low grade squamous intraepithelial  dysplasia) 05/02/2014   VIN I (vulvar intraepithelial neoplasia I) 05/02/2014   MVP (mitral valve prolapse)    Dysuria 06/09/2013    Past Surgical History:  Procedure Laterality Date   APPENDECTOMY  1999   CARDIAC CATHETERIZATION N/A 04/01/2016   Procedure: Left Heart Cath and Coronary Angiography;  Surgeon: Burnell Blanks, MD;  Location: [facility];  Service: Cardiovascular;  Laterality: N/A;   CERVICAL BIOPSY  W/ LOOP ELECTRODE EXCISION  11/28/14   LGSIL with negative margins   COLPOSCOPY  2006, 2015   2006 LGSIL, normal since; 2015 LGSIL w/LEEP   EXCISION MORTON'S NEUROMA Bilateral 2002   FOOT SURGERY     HEMORRHOID SURGERY     INTESTINAL MALROTATION REPAIR  1999   NASAL SINUS SURGERY     TUBAL LIGATION      OB History     Gravida  1   Para  1   Term  1   Preterm  0   AB  0   Living  1      SAB  0   IAB  0   Ectopic  0   Multiple  0   Live Births  1            Home Medications    Prior to Admission medications   Medication Sig Start Date End Date  Taking? Authorizing Provider  cetirizine (ZYRTEC) 10 MG tablet Take 1 tablet by mouth daily. 07/11/21 08/10/21 Yes Lynden Oxford Scales, PA-C  fluticasone (FLONASE) 50 MCG/ACT nasal spray Place 2 sprays into both nostrils daily. 07/11/21 08/10/21 Yes Lynden Oxford Scales, PA-C  ipratropium (ATROVENT) 0.06 % nasal spray Place 2 sprays into both nostrils 4 times daily as needed for rhinitis. 07/11/21  Yes Lynden Oxford Scales, PA-C  Cholecalciferol 125 MCG (5000 UT) TABS Take by mouth.    [provider]  Cranberry 1000 MG CAPS Take by mouth.    [provider]  diclofenac (VOLTAREN) 75 MG EC tablet Take 1 tablet (75 mg total) by mouth 2 (two) times daily with food 04/08/21   Lilia Argue R, DO  ibuprofen (ADVIL) 800 MG tablet TAKE 1 TABLET (800 MG TOTAL) BY MOUTH EVERY 8 (EIGHT) HOURS AS NEEDED. 07/30/20 07/30/21  Nunzio Cobbs, MD    Family History Family  History  Problem Relation Age of Onset   Heart disease Mother    Hypertension Mother    Aneurysm Father 34       brain    Hypertension Sister    Heart disease Maternal Grandmother    Cancer Paternal Grandmother    Cancer Paternal Grandfather    Breast cancer Maternal Aunt    BRCA 1/2 Cousin        negative    Social History Social History   Tobacco Use   Smoking status: Every Day    Packs/day: 0.50    Types: Cigarettes   Smokeless tobacco: Never   Tobacco comments:    2 cigarettes a day  Vaping Use   Vaping Use: Every day  Substance Use Topics   Alcohol use: Yes    Alcohol/week: 0.0 standard drinks    Comment: occasionally   Drug use: No     Allergies   Imitrex [sumatriptan], Iohexol, Penicillins, Prozac [fluoxetine hcl], Sulfa antibiotics, Zoloft [sertraline hcl], Iodinated diagnostic agents, and Shellfish allergy   Review of Systems Review of Systems Pertinent findings noted in history of present illness.    Physical Exam Triage Vital Signs ED Triage Vitals  Enc Vitals Group     BP      Pulse      Resp      Temp      Temp src      SpO2      Weight      Height      Head Circumference      Peak Flow      Pain Score      Pain Loc      Pain Edu?      Excl. in [place]?    No data found.  Updated Vital Signs BP (!) 145/87 (BP Location: Right Arm)   Temp 97.9 F (36.6 C) (Oral)   Resp 20   LMP 06/21/2021   SpO2 98%   Visual Acuity Right Eye Distance:   Left Eye Distance:   Bilateral Distance:    Right Eye Near:   Left Eye Near:    Bilateral Near:     Physical Exam Vitals and nursing note reviewed.  Constitutional:      Appearance: Normal appearance.  HENT:     Head: Normocephalic and atraumatic.     Right Ear: Ear canal and external ear normal. Tympanic membrane is bulging.     Left Ear: Ear canal and external ear normal. Tympanic membrane is bulging.  Ears:     Comments: Both TMs bulging with clear fluid.    Nose: Congestion and  rhinorrhea present. Rhinorrhea is clear.     Right Turbinates: Not enlarged, swollen or pale.     Left Turbinates: Not enlarged, swollen or pale.     Right Sinus: No maxillary sinus tenderness or frontal sinus tenderness.     Left Sinus: Frontal sinus tenderness present. No maxillary sinus tenderness.     Mouth/Throat:     Mouth: Mucous membranes are moist.     Pharynx: Oropharynx is clear. Uvula midline. No pharyngeal swelling, oropharyngeal exudate, posterior oropharyngeal erythema or uvula swelling.     Tonsils: No tonsillar exudate. 1+ on the right. 1+ on the left.  Eyes:     Extraocular Movements: Extraocular movements intact.     Conjunctiva/sclera: Conjunctivae normal.     Pupils: Pupils are equal, round, and reactive to light.  Cardiovascular:     Rate and Rhythm: Normal rate and regular rhythm.     Pulses: Normal pulses.     Heart sounds: Normal heart sounds.  Pulmonary:     Effort: Pulmonary effort is normal.     Breath sounds: Normal breath sounds.  Musculoskeletal:     Cervical back: Normal range of motion and neck supple.  Neurological:     General: No focal deficit present.     Mental Status: She is alert and oriented to person, place, and time. Mental status is at baseline.  Psychiatric:        Mood and Affect: Mood normal.        Behavior: Behavior normal.     UC Treatments / Results  Labs (all labs ordered are listed, but only abnormal results are displayed) Labs Reviewed  NOVEL CORONAVIRUS, NAA   Narrative:    Performed at:  [address] [place], [place]  [id number] Lab Director: Rush Farmer MD, Phone:  [phone]  CULTURE, GROUP A STREP [facility])  SARS-COV-2, NAA 2 DAY TAT   Narrative:    Performed at:  [address], [place], [place]  [id number] Lab Director: Rush Farmer MD, Phone:  [phone]  POCT RAPID STREP A (OFFICE)  POCT INFLUENZA A/B    EKG   Radiology No results  found.  Procedures Procedures (including critical care time)  Medications Ordered in UC Medications - No data to display  Initial Impression / Assessment and Plan / UC Course  I have reviewed the triage vital signs and the nursing notes.  Pertinent labs & imaging results that were available during my care of the patient were reviewed by me and considered in my medical decision making (see chart for details).     Patient was tested for influenza and streptococcal pharyngitis in the office, both rapid test were negative.  Throat culture will be sent for definitive testing.  Patient was also tested for COVID.  Patient was advised that she will be notified of the results once received.  Patient was provided with prescriptions for Atrovent, Flonase and Zyrtec for symptom relief.  Return precautions were provided.  Patient verbalized understanding and agreement of plan as discussed.  All questions were addressed during visit.  Please see discharge instructions below for further details of plan.  Final Clinical Impressions(s) / UC Diagnoses   Final diagnoses:  Flu-like symptoms  Nausea without vomiting  Diarrhea, unspecified type  Pharyngitis, unspecified etiology  Congestion of nasal sinus     Discharge Instructions  Please continue using your Nettie pot, Flonase and cetirizine tablets.  I recommend that you also add Atrovent nasal spray which contains an ingredient called ipratropium, this is commonly used for both allergies as well as rhinitis associated with upper respiratory infection.  You can also continue using ibuprofen as needed, anywhere from 400 to 800 mg depending on the severity of your sinus pressure and headache.  We will notify you of the results of your throat culture once they are available which usually takes 3 to 5 days.  We will also notify you of the results of your COVID-19 test which usually takes 6 to 12 hours.  Thank you for visiting urgent care and for  better soon.     ED Prescriptions     Medication Sig Dispense Auth. Provider   ipratropium (ATROVENT) 0.06 % nasal spray Place 2 sprays into both nostrils 4 times daily as needed for rhinitis. 15 mL Lynden Oxford Scales, PA-C   fluticasone (FLONASE) 50 MCG/ACT nasal spray Place 2 sprays into both nostrils daily. 16 g Lynden Oxford Scales, PA-C   cetirizine (ZYRTEC) 10 MG tablet Take 1 tablet by mouth daily. 30 tablet Lynden Oxford Scales, PA-C      PDMP not reviewed this encounter.   Lynden Oxford Scales, PA-C 07/12/21 1345

## 2021-07-12 LAB — NOVEL CORONAVIRUS, NAA: SARS-CoV-2, NAA: NOT DETECTED

## 2021-07-12 LAB — SARS-COV-2, NAA 2 DAY TAT

## 2021-07-14 LAB — CULTURE, GROUP A STREP (THRC)

## 2021-07-23 DIAGNOSIS — Z1231 Encounter for screening mammogram for malignant neoplasm of breast: Secondary | ICD-10-CM

## 2021-07-29 ENCOUNTER — Encounter: Payer: Self-pay | Admitting: Obstetrics and Gynecology

## 2021-07-29 NOTE — Telephone Encounter (Unsigned)
I can see Leshawn tomorrow afternoon for an office visit or she can take over the counter Monistat 1, 3 or 7 day treatment.

## 2021-07-29 NOTE — Telephone Encounter (Signed)
Spoke with patient regarding possible vaginal infection. Offered 1:30 opening to be seen today. Patient declined due to not being able to get off work. I will route to Provider to see if she will treat over phone or if patient needs to make an appointment tot be seen.

## 2021-07-31 ENCOUNTER — Other Ambulatory Visit: Payer: Self-pay | Admitting: Obstetrics and Gynecology

## 2021-07-31 ENCOUNTER — Other Ambulatory Visit: Payer: Self-pay | Admitting: Internal Medicine

## 2021-07-31 DIAGNOSIS — Z1231 Encounter for screening mammogram for malignant neoplasm of breast: Secondary | ICD-10-CM

## 2021-08-05 ENCOUNTER — Ambulatory Visit (INDEPENDENT_AMBULATORY_CARE_PROVIDER_SITE_OTHER): Payer: 59 | Admitting: Sports Medicine

## 2021-08-05 VITALS — BP 116/84 | Ht 68.5 in | Wt 145.0 lb

## 2021-08-05 DIAGNOSIS — M5136 Other intervertebral disc degeneration, lumbar region: Secondary | ICD-10-CM | POA: Diagnosis not present

## 2021-08-06 NOTE — Progress Notes (Signed)
   Subjective:    Patient ID: Victoria Barr, female    DOB: 20-Jul-1971, 50 y.o.   MRN: [id number]  HPI chief complaint: Left-sided low back pain  Victoria Barr presents today complaining of approximately 2 to 2-1/2 weeks of left-sided low back pain.  She was last seen in the office in August where she was complaining of right-sided low back pain and radiculopathy.  An MRI done previously had shown a small right foraminal L3-L4 disc extrusion and she had responded well to a previous ESI.  We ordered a repeat ESI but her symptoms actually improved quite a bit on oral diclofenac.  Her current pain started around the same time that she got sick with COVID.  She resumed her diclofenac but unfortunately experienced significant diarrhea.  Her pain is in the left posterior hip with radiating pain into the left lateral hip and thigh.  No radiating pain past the knee.  No weakness.  Pain is worse with sitting and improves with walking.  She has difficulty sleeping at night due to her pain.  She did recently purchase a TENS unit and is found that to be extremely helpful.  In fact today she states that her pain is improving compared to recent days.    Review of Systems As above    Objective:   Physical Exam  Well-developed, well-nourished.  No acute distress  Lumbar spine: There is some mild spasm of the paraspinal musculature bilaterally in the lower lumbar spine.  She has limited flexion and extension secondary to pain in both directions.  Negative straight leg raise.  Neurological exam shows good strength and equal reflexes at the patellar and Achilles tendons.      Assessment & Plan:   Left-sided low back pain likely discogenic in nature  Victoria Barr was asking whether or not the ESI that was ordered for her right lumbar radiculopathy would be appropriate for her left-sided low back pain.  I explained to her that it would not and I have instead recommended that we take a watchful waiting approach since her  symptoms seem to be improving.  She will continue with her TENS unit.  She will avoid oral anti-inflammatories given her recent bout with diarrhea (not sure whether it was the NSAIDs or COVID).  Future treatments could include formal physical therapy and possible shoe inserts.  In fact, Victoria Barr may be interested in some green sports insoles and scaphoid pads.  If so, she may return to the office at her convenience to have her shoes fitted with these.  Otherwise, follow-up with me for ongoing or recalcitrant issues.  This note was dictated using Dragon naturally speaking software and may contain errors in syntax, spelling, or content which have not been identified prior to signing this note.

## 2021-09-01 ENCOUNTER — Other Ambulatory Visit (HOSPITAL_COMMUNITY): Payer: Self-pay

## 2021-09-03 ENCOUNTER — Other Ambulatory Visit: Payer: Self-pay

## 2021-09-03 ENCOUNTER — Ambulatory Visit
Admission: RE | Admit: 2021-09-03 | Discharge: 2021-09-03 | Disposition: A | Discharge status: Home / Self Care | Payer: 59 | Source: Ambulatory Visit

## 2021-09-03 DIAGNOSIS — Z1231 Encounter for screening mammogram for malignant neoplasm of breast: Secondary | ICD-10-CM

## 2021-09-09 ENCOUNTER — Other Ambulatory Visit (HOSPITAL_COMMUNITY): Payer: Self-pay

## 2021-10-31 ENCOUNTER — Other Ambulatory Visit (HOSPITAL_COMMUNITY): Payer: Self-pay

## 2021-11-05 ENCOUNTER — Other Ambulatory Visit (HOSPITAL_COMMUNITY): Payer: Self-pay

## 2021-11-07 ENCOUNTER — Other Ambulatory Visit (HOSPITAL_COMMUNITY): Payer: Self-pay

## 2021-11-09 ENCOUNTER — Other Ambulatory Visit: Payer: Self-pay

## 2021-11-10 ENCOUNTER — Other Ambulatory Visit (HOSPITAL_COMMUNITY): Payer: Self-pay

## 2021-11-12 ENCOUNTER — Other Ambulatory Visit (HOSPITAL_COMMUNITY): Payer: Self-pay

## 2021-11-12 ENCOUNTER — Other Ambulatory Visit: Payer: Self-pay

## 2021-11-14 ENCOUNTER — Other Ambulatory Visit (HOSPITAL_COMMUNITY): Payer: Self-pay

## 2021-11-18 ENCOUNTER — Other Ambulatory Visit (HOSPITAL_COMMUNITY): Payer: Self-pay

## 2021-11-18 ENCOUNTER — Ambulatory Visit: Payer: 59 | Admitting: Sports Medicine

## 2021-11-18 VITALS — BP 124/78 | Ht 68.5 in | Wt 151.0 lb

## 2021-11-18 DIAGNOSIS — M5136 Other intervertebral disc degeneration, lumbar region: Secondary | ICD-10-CM | POA: Diagnosis not present

## 2021-11-18 DIAGNOSIS — G8929 Other chronic pain: Secondary | ICD-10-CM

## 2021-11-18 DIAGNOSIS — M545 Low back pain, unspecified: Secondary | ICD-10-CM

## 2021-11-18 MED ORDER — DICLOFENAC SODIUM 75 MG PO TBEC
75.0000 mg | DELAYED_RELEASE_TABLET | Freq: Two times a day (BID) | ORAL | 0 refills | Status: AC
  Filled 2021-11-18: qty 60, 30d supply, fill #0

## 2021-11-18 NOTE — Progress Notes (Signed)
Patient was instructed in 10 minutes of therapeutic exercises for low back pain to improve strength, ROM and function according to my instructions and plan of care by a Certified Athletic Trainer during the office visit.  Proper technique shown and discussed, handout provided.  All questions discussed and answered.  °

## 2021-11-18 NOTE — Progress Notes (Signed)
° °  Subjective:    Patient ID: Victoria Barr, female    DOB: 11/10/1970, 51 y.o.   MRN: [id number]  HPI  [name] presents today with returning right-sided low back pain.  A few days ago her pain was pretty severe.  It was bad enough that she thought about seeking treatment in the emergency room.  She localizes the pain to the right side of her lumbar spine.  No radiating pain currently but she has experienced that in the past.  A previous MRI showed bulging lumbar disks at multiple levels on the right.  She has had good success with lumbar ESI's in the past.  She is currently taking diclofenac which does seem to help but she does not want to take it chronically.  Intra medical history reviewed Medications reviewed Allergies reviewed Social history significant for a change in jobs.  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   Review of Systems As above    Objective:   Physical Exam  Well-developed, well-nourished.  No acute distress  Lumbar spine: Pain with forward flexion.  No pain with extension.  There is spasm diffusely along the lower lumbar spine.  No midline tenderness.  Strength is 5/5.  Reflexes are trace but equal at the Achilles and patellar reflexes bilaterally.      Assessment & Plan:   Returning low back pain secondary to lumbar degenerative disc disease  Would like to get an updated MRI of her lumbar spine in anticipation of referring for repeat lumbar ESI's.  Phone follow-up with those MRI results when available.  In the meantime, I will refill her diclofenac to take as needed.  She understands how to take it correctly.  I will also give her some McKenzie exercises to do in the form of a home exercise program and she is asking about possible chiropractic care.  I explained that I am okay with chiropractic care as long as the chiropractor does not recommend too many treatments.  I will provide her with  contact  information for both Dr. Vear Clock and Dr. Lendon Collar.  This note was dictated using Dragon naturally speaking software and may contain errors in syntax, spelling, or content which have not been identified prior to signing this note.

## 2021-11-18 NOTE — Patient Instructions (Addendum)
[facility] [phone] [address], [place], [place] [address]  Dr. Ernesta Amble [facility] [address], [place], [place] [address] [phone]  [facility] [address], [place], [place] [address] Phone: [phone]

## 2021-11-18 NOTE — Addendum Note (Signed)
Addended by: Rutha Bouchard E on: 11/18/2021 04:41 PM   Modules accepted: Orders

## 2021-11-25 ENCOUNTER — Ambulatory Visit: Payer: 59 | Admitting: Neurology

## 2021-12-02 ENCOUNTER — Ambulatory Visit: Payer: 59 | Admitting: Obstetrics and Gynecology

## 2021-12-23 ENCOUNTER — Encounter: Payer: Self-pay | Admitting: Obstetrics and Gynecology

## 2021-12-24 NOTE — Telephone Encounter (Signed)
Please move up patient's annual exam appointment with me.  ? ?I will need to see her in the office to determine the cause of the bleeding.  ?It could be perimenopause.  ? ? ?

## 2022-02-05 ENCOUNTER — Ambulatory Visit
Admission: RE | Admit: 2022-02-05 | Discharge: 2022-02-05 | Disposition: A | Discharge status: Home / Self Care | Payer: Self-pay | Source: Ambulatory Visit | Attending: Urgent Care | Admitting: Urgent Care

## 2022-02-05 VITALS — BP 151/79 | HR 78 | Temp 98.1°F | Resp 20

## 2022-02-05 DIAGNOSIS — R058 Other specified cough: Secondary | ICD-10-CM

## 2022-02-05 DIAGNOSIS — H65192 Other acute nonsuppurative otitis media, left ear: Secondary | ICD-10-CM

## 2022-02-05 DIAGNOSIS — J329 Chronic sinusitis, unspecified: Secondary | ICD-10-CM

## 2022-02-05 DIAGNOSIS — J309 Allergic rhinitis, unspecified: Secondary | ICD-10-CM

## 2022-02-05 MED ORDER — PREDNISONE 20 MG PO TABS
ORAL_TABLET | ORAL | 0 refills | Status: DC
Start: 2022-02-05 — End: 2022-02-11

## 2022-02-05 MED ORDER — CEFDINIR 300 MG PO CAPS: 300.0000 mg | ORAL_CAPSULE | Freq: Two times a day (BID) | ORAL | 0 refills | Status: DC

## 2022-02-05 NOTE — ED Triage Notes (Signed)
Pt here with strep exposure from son and has cough, sore throat, body aches and headache x 3 days.

## 2022-02-05 NOTE — ED Provider Notes (Signed)
[place]   MRN: [id number] DOB: [birth year]  Subjective:   Victoria Barr is a 51 y.o. female presenting for 3-day history of acute onset recurrent sinus pain, sinus congestion, sinus drainage, moderate to severe left ear pain, productive cough, sinus headaches.  Patient vapes, quit smoking.  She did have exposure to strep throat through her son.  No chest pain, shortness of breath or wheezing.  She takes Zyrtec daily for her allergies.  Has a history of recurrent sinus infections.  No current facility-administered medications for this encounter.  Current Outpatient Medications:    cetirizine (ZYRTEC) 10 MG tablet, Take 1 tablet by mouth daily., Disp: 30 tablet, Rfl: 0   Cholecalciferol 125 MCG (5000 UT) TABS, Take by mouth., Disp: , Rfl:    Cranberry 1000 MG CAPS, Take by mouth., Disp: , Rfl:    diclofenac (VOLTAREN) 75 MG EC tablet, Take 1 tablet (75 mg total) by mouth 2 (two) times daily with food, Disp: 60 tablet, Rfl: 0   fluticasone (FLONASE) 50 MCG/ACT nasal spray, Place 2 sprays into both nostrils daily., Disp: 16 g, Rfl: 2   ipratropium (ATROVENT) 0.06 % nasal spray, Place 2 sprays into both nostrils 4 times daily as needed for rhinitis., Disp: 15 mL, Rfl: 0   Allergies  Allergen Reactions   Imitrex [Sumatriptan] Anaphylaxis and Hives   Iohexol Hives    Patient must pre medicated if having an exam with contrast per Dr. Maxwell// rls 11/01/19 Pt tolerates LESI w/o prep. [name], RN   Penicillins Hives    Has patient had a PCN reaction causing immediate rash, facial/tongue/throat swelling, SOB or lightheadedness with hypotension: Yes Has patient had a PCN reaction causing severe rash involving mucus membranes or skin necrosis: Yes Has patient had a PCN reaction that required hospitalization Yes Has patient had a PCN reaction occurring within the last 10 years: No If all of the above answers are "NO", then may proceed with Cephalosporin use.     Prozac [Fluoxetine Hcl] Hives   Sulfa Antibiotics Hives   Zoloft [Sertraline Hcl] Hives   Iodinated Contrast Media Hives   Shellfish Allergy Hives    Past Medical History:  Diagnosis Date   Allergy    Anxiety    Arthritis    Depression    GERD (gastroesophageal reflux disease) 04/08/2016   History of abnormal cervical Pap smear 2006, 2015   2015 Neg with + HR HPV, 09/2014 ASCUS with + HR HPV, colpo with LEEP 11/2014-LGSIL (CIN-I)   Hyperlipidemia    Intestinal malrotation    MVP (mitral valve prolapse)    echo in 2012 did not show MVP but did have mild elongation of the AMVL   Palpitations    Pyelonephritis 03/2015   evaluated by urology (Dr. Karsten Ro)   Recurrent UTI    Tachycardia      Past Surgical History:  Procedure Laterality Date   APPENDECTOMY  1999   CARDIAC CATHETERIZATION N/A 04/01/2016   Procedure: Left Heart Cath and Coronary Angiography;  Surgeon: Burnell Blanks, MD;  Location: [place] [facility];  Service: Cardiovascular;  Laterality: N/A;   CERVICAL BIOPSY  W/ LOOP ELECTRODE EXCISION  11/28/14   LGSIL with negative margins   COLPOSCOPY  2006, 2015   2006 LGSIL, normal since; 2015 LGSIL w/LEEP   EXCISION MORTON'S NEUROMA Bilateral 2002   FOOT SURGERY     HEMORRHOID SURGERY     INTESTINAL MALROTATION REPAIR  1999   NASAL SINUS  SURGERY     TUBAL LIGATION      Family History  Problem Relation Age of Onset   Heart disease Mother    Hypertension Mother    Aneurysm Father 105       brain    Hypertension Sister    Heart disease Maternal Grandmother    Cancer Paternal Grandmother    Cancer Paternal Grandfather    Breast cancer Maternal Aunt    BRCA 1/2 Cousin        negative    Social History   Tobacco Use   Smoking status: Every Day    Packs/day: 0.50    Types: Cigarettes   Smokeless tobacco: Never   Tobacco comments:    2 cigarettes a day  Vaping Use   Vaping Use: Every day  Substance Use Topics   Alcohol use: Yes    Alcohol/week: 0.0  standard drinks    Comment: occasionally   Drug use: No    ROS   Objective:   Vitals: BP (!) 151/79   Pulse 78   Temp 98.1 F (36.7 C)   Resp 20   SpO2 98%   Physical Exam Constitutional:      General: She is not in acute distress.    Appearance: Normal appearance. She is well-developed and normal weight. She is not ill-appearing, toxic-appearing or diaphoretic.  HENT:     Head: Normocephalic and atraumatic.     Right Ear: Tympanic membrane, ear canal and external ear normal. No drainage or tenderness. No middle ear effusion. There is no impacted cerumen. Tympanic membrane is not erythematous.     Left Ear: Ear canal and external ear normal. Tenderness present. No drainage. A middle ear effusion is present. There is no impacted cerumen. Tympanic membrane is erythematous.     Nose: Congestion present. No rhinorrhea.     Mouth/Throat:     Mouth: Mucous membranes are moist. No oral lesions.     Pharynx: Posterior oropharyngeal erythema (with associated postnasal drainage overlying pharynx) present. No pharyngeal swelling, oropharyngeal exudate or uvula swelling.     Tonsils: No tonsillar exudate or tonsillar abscesses.  Eyes:     General: No scleral icterus.       Right eye: No discharge.        Left eye: No discharge.     Extraocular Movements: Extraocular movements intact.     Right eye: Normal extraocular motion.     Left eye: Normal extraocular motion.     Conjunctiva/sclera: Conjunctivae normal.  Cardiovascular:     Rate and Rhythm: Normal rate and regular rhythm.     Heart sounds: Normal heart sounds. No murmur heard.   No friction rub. No gallop.  Pulmonary:     Effort: Pulmonary effort is normal. No respiratory distress.     Breath sounds: No stridor. No wheezing, rhonchi or rales.  Chest:     Chest wall: No tenderness.  Musculoskeletal:     Cervical back: Normal range of motion and neck supple.  Lymphadenopathy:     Cervical: No cervical adenopathy.  Skin:     General: Skin is warm and dry.  Neurological:     General: No focal deficit present.     Mental Status: She is alert and oriented to person, place, and time.  Psychiatric:        Mood and Affect: Mood normal.        Behavior: Behavior normal.    Assessment and Plan :   PDMP not  reviewed this encounter.  1. Recurrent sinusitis   2. Other non-recurrent acute nonsuppurative otitis media of left ear   3. Productive cough   4. Allergic rhinitis, unspecified seasonality, unspecified trigger    Will start empiric treatment for sinusitis with cefdinir.  In the context of her allergic rhinitis and vaping recommended an oral prednisone course. Counseled patient on potential for adverse effects with medications prescribed/recommended today, ER and return-to-clinic precautions discussed, patient verbalized understanding.    Jaynee Eagles, PA-C 02/05/22 1711

## 2022-02-10 NOTE — Progress Notes (Unsigned)
51 y.o. G40P1001 Single Caucasian female here for annual exam.    Patient having cycles every 17-21 days for a couple of months.  Did have a period of stress.  Periods were regular prior to this.   Her last menstrual cycle was 21 days apart.    Has moodiness, cramps, headaches prior to cycles.  Stopped smoking cigarettes and is now vaping.   Lost weight, 20 pounds.   Working at [employer] on [address] [address].   PCP:  Revonda Humphrey, MD  Patient's last menstrual period was 02/07/2022 (exact date).     Period Cycle (Days): 21 Period Duration (Days): 5-7 Period Pattern: Regular Menstrual Flow: Moderate Menstrual Control: Maxi pad, Tampon Menstrual Control Change Freq (Hours): changes maxi pad every 2-3 hours on heaviest day Dysmenorrhea: (!) Mild Dysmenorrhea Symptoms: Cramping, Headache (symptoms usually prior to cycles)     Sexually active: No.  The current method of family planning is tubal ligation.    Exercising: No.  The patient does not participate in regular exercise at present. Smoker:  quit smoking cig.2022; vapes nicotine  Health Maintenance: Pap:  07-30-20 Neg:Neg HR HPV, 07-15-17 Neg, 06-30-16 Neg:Neg HR HPV History of abnormal Pap:  yes, 2016 Hx LEEP w/Neg margins. 03/2014 colpo cervical bx LGSIL, perineal bx LGSIL-VIN I. MMG:  09-03-21 Neg/Birads1 Colonoscopy:  Years ago normal.  She is due and sees Dr. Benson Norway.  She will contact his office.  BMD:   n/a  Result  n/a TDaP:  09-22-11--PATIENT DECLINES TODAY Gardasil:   no HIV: 02/2020 Neg  Hep C: 02/2020 Neg  Screening Labs:  PCP   reports that she quit smoking about 16 months ago. Her smoking use included cigarettes. She smoked an average of .5 packs per day. She has never used smokeless tobacco. She reports current alcohol use. She reports that she does not use drugs.  Past Medical History:  Diagnosis Date   Allergy    Anxiety    Arthritis    Depression    GERD (gastroesophageal reflux disease)  04/08/2016   History of abnormal cervical Pap smear 2006, 2015   2015 Neg with + HR HPV, 09/2014 ASCUS with + HR HPV, colpo with LEEP 11/2014-LGSIL (CIN-I)   Hyperlipidemia    Intestinal malrotation    MVP (mitral valve prolapse)    echo in 2012 did not show MVP but did have mild elongation of the AMVL   Palpitations    Pyelonephritis 03/2015   evaluated by urology (Dr. Karsten Ro)   Recurrent UTI    Tachycardia     Past Surgical History:  Procedure Laterality Date   APPENDECTOMY  1999   CARDIAC CATHETERIZATION N/A 04/01/2016   Procedure: Left Heart Cath and Coronary Angiography;  Surgeon: Burnell Blanks, MD;  Location: [place] [facility];  Service: Cardiovascular;  Laterality: N/A;   CERVICAL BIOPSY  W/ LOOP ELECTRODE EXCISION  11/28/14   LGSIL with negative margins   COLPOSCOPY  2006, 2015   2006 LGSIL, normal since; 2015 LGSIL w/LEEP   EXCISION MORTON'S NEUROMA Bilateral 2002   FOOT SURGERY     HEMORRHOID SURGERY     INTESTINAL MALROTATION REPAIR  1999   NASAL SINUS SURGERY     TUBAL LIGATION      Current Outpatient Medications  Medication Sig Dispense Refill   cefdinir (OMNICEF) 300 MG capsule Take 1 capsule (300 mg total) by mouth 2 (two) times daily. 20 capsule 0   Cholecalciferol 125 MCG (5000 UT) TABS Take by  mouth.     Cranberry 1000 MG CAPS Take by mouth.     diclofenac (VOLTAREN) 75 MG EC tablet Take 1 tablet (75 mg total) by mouth 2 (two) times daily with food 60 tablet 0   ibuprofen (ADVIL) 800 MG tablet      Multiple Vitamin (MULTIVITAMIN) capsule Take 1 capsule by mouth daily.     cetirizine (ZYRTEC) 10 MG tablet Take 1 tablet by mouth daily. 30 tablet 0   fluticasone (FLONASE) 50 MCG/ACT nasal spray Place 2 sprays into both nostrils daily. 16 g 2   No current facility-administered medications for this visit.    Family History  Problem Relation Age of Onset   Heart disease Mother    Hypertension Mother    Aneurysm Father 74       brain    Hypertension  Sister    Heart disease Maternal Grandmother    Cancer Paternal Grandmother    Cancer Paternal Grandfather    Breast cancer Maternal Aunt    BRCA 1/2 Cousin        negative    Review of Systems  Genitourinary:  Positive for menstrual problem (Having cycles every 17-21 days).  Neurological:  Positive for headaches (prior to menses).  Psychiatric/Behavioral:  Positive for agitation (moodiness prior to menses).   All other systems reviewed and are negative.  Exam:   BP 122/78   Pulse 93   Ht 5' 7.5" (1.715 m)   Wt 151 lb (68.5 kg)   LMP 02/07/2022 (Exact Date)   SpO2 98%   BMI 23.30 kg/m     General appearance: alert, cooperative and appears stated age Head: normocephalic, without obvious abnormality, atraumatic Neck: no adenopathy, supple, symmetrical, trachea midline and thyroid normal to inspection and palpation Lungs: clear to auscultation bilaterally Breasts: normal appearance, no masses or tenderness, No nipple retraction or dimpling, No nipple discharge or bleeding, No axillary adenopathy Heart: regular rate and rhythm Abdomen: soft, non-tender; no masses, no organomegaly Extremities: extremities normal, atraumatic, no cyanosis or edema Skin: skin color, texture, turgor normal. No rashes or lesions Lymph nodes: cervical, supraclavicular, and axillary nodes normal. Neurologic: grossly normal  Pelvic: External genitalia:  no lesions              No abnormal inguinal nodes palpated.              Urethra:  normal appearing urethra with no masses, tenderness or lesions              Bartholins and Skenes: normal                 Vagina: normal appearing vagina with normal color and discharge, no lesions              Cervix: no lesions.  Very light menstrual flow.              Pap taken: no Bimanual Exam:  Uterus:  normal size, contour, position, consistency, mobility, non-tender              Adnexa: no mass, fullness, tenderness              Rectal exam: yes.  Confirms.               Anus:  normal sphincter tone, no lesions  Chaperone was present for exam:  Estill Bamberg, CMA  Assessment:   Well woman visit with gynecologic exam. Status post BTL.  Irregular cycles, very recent.  PMS symptoms.  Allergy to Prozac and Zoloft.  Hx LEEP.  Dysmenorrhea.  Vaping.   Plan: Mammogram screening discussed. Self breast awareness reviewed. Pap and HR [id number]. Guidelines for Calcium, Vitamin D, regular exercise program including cardiovascular and weight bearing exercise. Start Micronor birth control pills.  Instructed in use.  #84, RF none.   If frequent cycles persist, do pelvic ultrasound.   Patient will send me a message to let me know how she is doing.  Follow up annually and prn.    After visit summary provided.

## 2022-02-11 ENCOUNTER — Ambulatory Visit (INDEPENDENT_AMBULATORY_CARE_PROVIDER_SITE_OTHER): Payer: 59 | Admitting: Obstetrics and Gynecology

## 2022-02-11 ENCOUNTER — Encounter: Payer: Self-pay | Admitting: Obstetrics and Gynecology

## 2022-02-11 VITALS — BP 122/78 | HR 93 | Ht 67.5 in | Wt 151.0 lb

## 2022-02-11 DIAGNOSIS — Z01419 Encounter for gynecological examination (general) (routine) without abnormal findings: Secondary | ICD-10-CM

## 2022-02-11 MED ORDER — IBUPROFEN 800 MG PO TABS: 800.0000 mg | ORAL_TABLET | Freq: Three times a day (TID) | ORAL | 2 refills | Status: DC | PRN

## 2022-02-11 MED ORDER — NORETHINDRONE 0.35 MG PO TABS: 1.0000 | ORAL_TABLET | Freq: Every day | ORAL | 0 refills | Status: DC

## 2022-02-11 NOTE — Patient Instructions (Signed)

## 2022-02-20 ENCOUNTER — Ambulatory Visit: Payer: 59 | Admitting: Student

## 2022-02-20 ENCOUNTER — Encounter: Payer: Self-pay | Admitting: Student

## 2022-02-20 VITALS — BP 128/80 | HR 90 | Temp 97.8°F | Ht 68.5 in | Wt 151.9 lb

## 2022-02-20 DIAGNOSIS — R3915 Urgency of urination: Secondary | ICD-10-CM

## 2022-02-20 DIAGNOSIS — K5900 Constipation, unspecified: Secondary | ICD-10-CM | POA: Insufficient documentation

## 2022-02-20 HISTORY — DX: Urgency of urination: R39.15

## 2022-02-20 NOTE — Assessment & Plan Note (Addendum)
Patient complaining of urinary urgency and frequency for the past couple of days. Denies burning, pain, hematuria, fevers/chills. Unsure if this is related to constipation. States she has a history of UTIs and wishes to ensure her symptoms are not caused by UTI. Agree with patient that UTI should be ruled out, although I anticipate symptoms are likely from constipation-related compression of bladder.   Plan: -f/u UA with reflex

## 2022-02-20 NOTE — Patient Instructions (Addendum)
Ms. Klecker,  It was a pleasure seeing you in the clinic today.   Please use miralax daily along with Citrucel (over-the-counter fiber supplement) to help with your constipation. I will schedule a telehealth visit in 2 weeks to make sure you are feeling better. We will get a urinalysis today to make sure you do not have a UTI. I will call you with the results.  Please call our clinic at [phone] if you have any questions or concerns. The best time to call is Monday-Friday from 9am-4pm, but there is someone available 24/7 at the same number. If you need medication refills, please notify your pharmacy one week in advance and they will send [place] a request.   Thank you for letting us take part in your care. We look forward to seeing you next time!

## 2022-02-20 NOTE — Progress Notes (Signed)
   CC: routine f/u visit for chronic medical problems  HPI:  Victoria Barr is a 51 y.o. female with history listed below presenting to the [facility] for routine f/u for chronic medical problems. Please see individualized problem based charting for full HPI.  Past Medical History:  Diagnosis Date   Allergy    Anxiety    Arthritis    Depression    GERD (gastroesophageal reflux disease) 04/08/2016   History of abnormal cervical Pap smear 2006, 2015   2015 Neg with + HR HPV, 09/2014 ASCUS with + HR HPV, colpo with LEEP 11/2014-LGSIL (CIN-I)   Hyperlipidemia    Intestinal malrotation    MVP (mitral valve prolapse)    echo in 2012 did not show MVP but did have mild elongation of the AMVL   Palpitations    Pyelonephritis 03/2015   evaluated by urology (Dr. Karsten Ro)   Recurrent UTI    Tachycardia     Review of Systems:  Negative aside from that listed in individualized problem based charting.  Physical Exam:  Vitals:   02/20/22 0935  BP: 128/80  Pulse: 90  Temp: 97.8 F (36.6 C)  TempSrc: Oral  SpO2: 100%  Weight: 151 lb 14.4 oz (68.9 kg)  Height: 5' 8.5" (1.74 m)   Physical Exam Constitutional:      Appearance: Normal appearance. She is not ill-appearing.  HENT:     Nose: Nose normal. No congestion.     Mouth/Throat:     Mouth: Mucous membranes are moist.     Pharynx: Oropharynx is clear.  Eyes:     Extraocular Movements: Extraocular movements intact.     Conjunctiva/sclera: Conjunctivae normal.     Pupils: Pupils are equal, round, and reactive to light.  Cardiovascular:     Rate and Rhythm: Normal rate and regular rhythm.     Pulses: Normal pulses.     Heart sounds: Normal heart sounds. No murmur heard.   No gallop.  Pulmonary:     Effort: Pulmonary effort is normal.     Breath sounds: Normal breath sounds. No wheezing, rhonchi or rales.  Abdominal:     General: There is no distension.     Palpations: Abdomen is soft. There is no mass.     Tenderness: There is  no abdominal tenderness. There is no guarding or rebound.     Comments: Hypoactive bowel sounds noted.  Musculoskeletal:        General: No swelling.  Lymphadenopathy:     Cervical: No cervical adenopathy.  Skin:    General: Skin is warm and dry.  Neurological:     General: No focal deficit present.     Mental Status: She is alert and oriented to person, place, and time.  Psychiatric:        Mood and Affect: Mood normal.        Behavior: Behavior normal.     Assessment & Plan:   See Encounters Tab for problem based charting.  Patient discussed with Dr.  Saverio Danker

## 2022-02-20 NOTE — Assessment & Plan Note (Addendum)
Patient complaining of 2 day history of constipation. States that she has a remote history of intestinal malrotation at the age 51 for which she underwent surgical intervention (and appendectomy at that time as well). She has not had any concerning GI history afterwards and typically has one regular BM daily. For the past 2 days, she has not passed a BM and has been feeling gassy. She denies any N/V, decreased PO intake, or any other systemic symptoms. She reports that she did have one loose BM this morning, nonbloody, and had some improvement in her symptoms. She has not yet tried any laxative therapies to help with constipation. Advised trying miralax daily and Citrucel daily to help with promoting regular BMs. She is in agreement with plan. Discussed return precautions (N/V, decreased PO intake, systemic symptoms). Will schedule a telehealth visit in 2 weeks to ensure resolution with conservative management. If no improvement or worsening symptoms, consider upright abdominal plain films for further evaluation.  Plan: -miralax daily and citrucel daily -telehealth visit in 2 weeks -upright abdominal films if worsening symptoms

## 2022-02-21 LAB — URINALYSIS, ROUTINE W REFLEX MICROSCOPIC
Bilirubin, UA: NEGATIVE
Glucose, UA: NEGATIVE
Ketones, UA: NEGATIVE
Leukocytes,UA: NEGATIVE
Nitrite, UA: NEGATIVE
Protein,UA: NEGATIVE
RBC, UA: NEGATIVE
Specific Gravity, UA: 1.006 (ref 1.005–1.030)
Urobilinogen, Ur: 0.2 mg/dL (ref 0.2–1.0)
pH, UA: 6 (ref 5.0–7.5)

## 2022-02-23 NOTE — Progress Notes (Signed)
Internal Medicine Clinic Attending  Case discussed with Dr. Jinwala  At the time of the visit.  We reviewed the resident's history and exam and pertinent patient test results.  I agree with the assessment, diagnosis, and plan of care documented in the resident's note.  

## 2022-03-04 ENCOUNTER — Ambulatory Visit: Payer: Self-pay | Admitting: Obstetrics and Gynecology

## 2022-03-06 ENCOUNTER — Encounter: Payer: Self-pay | Admitting: Student

## 2022-03-06 ENCOUNTER — Ambulatory Visit (INDEPENDENT_AMBULATORY_CARE_PROVIDER_SITE_OTHER): Payer: 59 | Admitting: Student

## 2022-03-06 DIAGNOSIS — F17200 Nicotine dependence, unspecified, uncomplicated: Secondary | ICD-10-CM

## 2022-03-06 DIAGNOSIS — E782 Mixed hyperlipidemia: Secondary | ICD-10-CM

## 2022-03-06 DIAGNOSIS — K5900 Constipation, unspecified: Secondary | ICD-10-CM | POA: Diagnosis not present

## 2022-03-06 DIAGNOSIS — E559 Vitamin D deficiency, unspecified: Secondary | ICD-10-CM

## 2022-03-06 DIAGNOSIS — R739 Hyperglycemia, unspecified: Secondary | ICD-10-CM

## 2022-03-06 NOTE — Assessment & Plan Note (Signed)
Ms. Dobransky states that after our last visit, she went another 3 days without passing a BM and was having some gaseous stomach discomfort. However, after initiation of miralax, her bowel movements have normalized. She is now having to take miralax every other day to have about 1-2 bowel movements a day. I discussed with her that it is safe to take miralax daily or every other day as needed to aim for 1-2 bowel movements a day, which she understands. She is no longer having any stomach pain and does not feel bloated anymore.   Plan: -continue miralax prn -advised to return if unable to pass BM for >2-3 days

## 2022-03-06 NOTE — Progress Notes (Signed)
  [facility] Residency Telephone Encounter Continuity Care Appointment  HPI:  This telephone encounter was created for Ms. Victoria Barr on 03/06/2022 for the following purpose/cc: f/u constipation. Confirmed identity via DOB and home address. This visit was scheduled to follow up on constipation. Briefly, patient was seen 2 weeks ago for 2-day history of constipation and advised to take miralax and citrucel daily to help with normalizing bowel movements. She has a remote history of intestinal malrotation at age 51 for which she underwent surgical intervention (unclear exact procedure, but also involved appendectomy). No concerning GI history thereafter.  Victoria Barr states that after our last visit, she went another 3 days without passing a BM and was having some gaseous stomach discomfort. However, after initiation of miralax, her bowel movements have normalized. She is now having to take miralax every other day to have about 1-2 bowel movements a day. I discussed with her that it is safe to take miralax daily or every other day as needed to aim for 1-2 bowel movements a day, which she understands. She is no longer having any stomach pain and does not feel bloated anymore.  Victoria Barr does want to obtain routine lab work, which was last obtained over a year ago. She would like to obtain a lipid panel (last LDL 138), A1c (last one 5.4%), BMP (to evaluate electrolytes and kidney function), and vitamin D level (she was previously deficient but this was correct and she is no longer on supplementation). I believe these are all reasonable so will place orders for a future lab-only visit.   Past Medical History:  Past Medical History:  Diagnosis Date   Allergy    Anxiety    Arthritis    Depression    GERD (gastroesophageal reflux disease) 04/08/2016   History of abnormal cervical Pap smear 2006, 2015   2015 Neg with + HR HPV, 09/2014 ASCUS with + HR HPV, colpo with LEEP  11/2014-LGSIL (CIN-I)   Hyperlipidemia    Intestinal malrotation    MVP (mitral valve prolapse)    echo in 2012 did not show MVP but did have mild elongation of the AMVL   Palpitations    Pyelonephritis 03/2015   evaluated by urology (Dr. Vernie Ammons)   Recurrent UTI    Tachycardia      ROS:  Negative aside from that in HPI.   Assessment / Plan / Recommendations:  Please see A&P under problem oriented charting for assessment of the patient's acute and chronic medical conditions.  As always, pt is advised that if symptoms worsen or new symptoms arise, they should go to an urgent care facility or to to ER for further evaluation.   Consent and Medical Decision Making:  Patient discussed with Dr. Cleda Daub This is a telephone encounter between Victoria Barr and Merrilyn Puma on 03/06/2022 for f/u constipation. The visit was conducted with the patient located at home and Merrilyn Puma at [facility]. The patient's identity was confirmed using their DOB and current address. The patient has consented to being evaluated through a telephone encounter and understands the associated risks (an examination cannot be done and the patient may need to come in for an appointment) / benefits (allows the patient to remain at home, decreasing exposure to coronavirus). I personally spent 15 minutes on medical discussion.

## 2022-03-23 ENCOUNTER — Ambulatory Visit
Admission: RE | Admit: 2022-03-23 | Discharge: 2022-03-23 | Disposition: A | Discharge status: Home / Self Care | Payer: 59 | Source: Ambulatory Visit | Attending: Urgent Care | Admitting: Urgent Care

## 2022-03-23 VITALS — BP 126/79 | HR 79 | Temp 98.0°F | Resp 20

## 2022-03-23 DIAGNOSIS — R052 Subacute cough: Secondary | ICD-10-CM

## 2022-03-23 DIAGNOSIS — J309 Allergic rhinitis, unspecified: Secondary | ICD-10-CM

## 2022-03-23 DIAGNOSIS — R0602 Shortness of breath: Secondary | ICD-10-CM

## 2022-03-23 DIAGNOSIS — H9202 Otalgia, left ear: Secondary | ICD-10-CM | POA: Diagnosis not present

## 2022-03-23 DIAGNOSIS — R0981 Nasal congestion: Secondary | ICD-10-CM | POA: Diagnosis not present

## 2022-03-23 DIAGNOSIS — Z720 Tobacco use: Secondary | ICD-10-CM

## 2022-03-23 MED ORDER — FEXOFENADINE HCL 180 MG PO TABS: 180.0000 mg | ORAL_TABLET | Freq: Every day | ORAL | 0 refills | Status: DC

## 2022-03-23 MED ORDER — PREDNISONE 50 MG PO TABS: 50.0000 mg | ORAL_TABLET | Freq: Every day | ORAL | 0 refills | Status: DC

## 2022-03-23 NOTE — ED Triage Notes (Signed)
Pt here with sinus pressure, headache, congestion, left ear pain, and some abdominal cramping x 2 days

## 2022-03-23 NOTE — ED Provider Notes (Signed)
[place]   MRN: [id number] DOB: 02/14/1971  Subjective:   Victoria Barr is a 51 y.o. female presenting for 2 day history of acute onset recurrent sinus congestion, sinus pressure, sinus headaches, left ear pain, post-nasal drainage. Has longstanding history of chronic rhinitis, allergic rhinitis.  Patient is currently taking Zyrtec and has been doing so for the last 2 months.  She is sensitive to medications and does not want to switch.  No cough, chest pain.  She has had some intermittent shortness of breath but cannot tell if it is coming from her sinuses or her lungs.  She does vape daily.  She was supposed to see an ear nose throat specialist but ultimately was not able to get scheduled with them.  She has not seen an allergist.  No current facility-administered medications for this encounter.  Current Outpatient Medications:    cefdinir (OMNICEF) 300 MG capsule, Take 1 capsule (300 mg total) by mouth 2 (two) times daily., Disp: 20 capsule, Rfl: 0   cetirizine (ZYRTEC) 10 MG tablet, Take 1 tablet by mouth daily., Disp: 30 tablet, Rfl: 0   Cholecalciferol 125 MCG (5000 UT) TABS, Take by mouth., Disp: , Rfl:    Cranberry 1000 MG CAPS, Take by mouth., Disp: , Rfl:    diclofenac (VOLTAREN) 75 MG EC tablet, Take 1 tablet (75 mg total) by mouth 2 (two) times daily with food, Disp: 60 tablet, Rfl: 0   fluticasone (FLONASE) 50 MCG/ACT nasal spray, Place 2 sprays into both nostrils daily., Disp: 16 g, Rfl: 2   ibuprofen (ADVIL) 800 MG tablet, Take 1 tablet (800 mg total) by mouth every 8 (eight) hours as needed., Disp: 60 tablet, Rfl: 2   Multiple Vitamin (MULTIVITAMIN) capsule, Take 1 capsule by mouth daily., Disp: , Rfl:    norethindrone (ORTHO MICRONOR) 0.35 MG tablet, Take 1 tablet (0.35 mg total) by mouth daily., Disp: 84 tablet, Rfl: 0   Allergies  Allergen Reactions   Imitrex [Sumatriptan] Anaphylaxis and Hives   Iohexol Hives    Patient must pre medicated  if having an exam with contrast per Dr. Maxwell// rls 11/01/19 Pt tolerates LESI w/o prep. [name], RN   Penicillins Hives    Has patient had a PCN reaction causing immediate rash, facial/tongue/throat swelling, SOB or lightheadedness with hypotension: Yes Has patient had a PCN reaction causing severe rash involving mucus membranes or skin necrosis: Yes Has patient had a PCN reaction that required hospitalization Yes Has patient had a PCN reaction occurring within the last 10 years: No If all of the above answers are "NO", then may proceed with Cephalosporin use.    Prozac [Fluoxetine Hcl] Hives   Sulfa Antibiotics Hives   Zoloft [Sertraline Hcl] Hives   Iodinated Contrast Media Hives   Shellfish Allergy Hives    Past Medical History:  Diagnosis Date   Allergy    Anxiety    Arthritis    Depression    GERD (gastroesophageal reflux disease) 04/08/2016   History of abnormal cervical Pap smear 2006, 2015   2015 Neg with + HR HPV, 09/2014 ASCUS with + HR HPV, colpo with LEEP 11/2014-LGSIL ([facility])   Hyperlipidemia    Intestinal malrotation    MVP (mitral valve prolapse)    echo in 2012 did not show MVP but did have mild elongation of the AMVL   Palpitations    Pyelonephritis 03/2015   evaluated by urology (Dr. Karsten Ro)   Recurrent UTI  Tachycardia    Urgency of urination 02/20/2022     Past Surgical History:  Procedure Laterality Date   APPENDECTOMY  1999   CARDIAC CATHETERIZATION N/A 04/01/2016   Procedure: Left Heart Cath and Coronary Angiography;  Surgeon: Burnell Blanks, MD;  Location: [facility];  Service: Cardiovascular;  Laterality: N/A;   CERVICAL BIOPSY  W/ LOOP ELECTRODE EXCISION  11/28/14   LGSIL with negative margins   COLPOSCOPY  2006, 2015   2006 LGSIL, normal since; 2015 LGSIL w/LEEP   EXCISION MORTON'S NEUROMA Bilateral 2002   FOOT SURGERY     HEMORRHOID SURGERY     INTESTINAL MALROTATION REPAIR  1999   NASAL SINUS SURGERY     TUBAL LIGATION       Family History  Problem Relation Age of Onset   Heart disease Mother    Hypertension Mother    Aneurysm Father 25       brain    Hypertension Sister    Heart disease Maternal Grandmother    Cancer Paternal Grandmother    Cancer Paternal Grandfather    Breast cancer Maternal Aunt    BRCA 1/2 Cousin        negative    Social History   Tobacco Use   Smoking status: Former    Packs/day: 0.50    Types: Cigarettes    Quit date: 2022    Years since quitting: 1.5   Smokeless tobacco: Never   Tobacco comments:    2 cigarettes a day  Vaping Use   Vaping Use: Every day   Substances: Nicotine  Substance Use Topics   Alcohol use: Yes    Comment: 2 drinks/month   Drug use: No    ROS   Objective:   Vitals: BP 126/79   Pulse 79   Temp 98 F (36.7 C)   Resp 20   SpO2 99%   Physical Exam Constitutional:      General: She is not in acute distress.    Appearance: Normal appearance. She is well-developed and normal weight. She is not ill-appearing, toxic-appearing or diaphoretic.  HENT:     Head: Normocephalic and atraumatic.     Right Ear: Tympanic membrane, ear canal and external ear normal. No drainage or tenderness. No middle ear effusion. There is no impacted cerumen. Tympanic membrane is not erythematous.     Left Ear: Tympanic membrane, ear canal and external ear normal. No drainage or tenderness.  No middle ear effusion. There is no impacted cerumen. Tympanic membrane is not erythematous.     Nose: Congestion present. No rhinorrhea.     Mouth/Throat:     Mouth: Mucous membranes are moist. No oral lesions.     Pharynx: No pharyngeal swelling, oropharyngeal exudate, posterior oropharyngeal erythema or uvula swelling.     Tonsils: No tonsillar exudate or tonsillar abscesses.  Eyes:     General: No scleral icterus.       Right eye: No discharge.        Left eye: No discharge.     Extraocular Movements: Extraocular movements intact.     Right eye: Normal  extraocular motion.     Left eye: Normal extraocular motion.     Conjunctiva/sclera: Conjunctivae normal.  Cardiovascular:     Rate and Rhythm: Normal rate and regular rhythm.     Heart sounds: Normal heart sounds. No murmur heard.    No friction rub. No gallop.  Pulmonary:     Effort: Pulmonary effort is normal.  No respiratory distress.     Breath sounds: No stridor. No wheezing, rhonchi or rales.  Chest:     Chest wall: No tenderness.  Musculoskeletal:     Cervical back: Normal range of motion and neck supple.  Lymphadenopathy:     Cervical: No cervical adenopathy.  Skin:    General: Skin is warm and dry.  Neurological:     General: No focal deficit present.     Mental Status: She is alert and oriented to person, place, and time.  Psychiatric:        Mood and Affect: Mood normal.        Behavior: Behavior normal.     Assessment and Plan :   PDMP not reviewed this encounter.  1. Allergic rhinitis, unspecified seasonality, unspecified trigger   2. Sinus congestion   3. Left ear pain   4. Subacute cough   5. Vapes nicotine containing substance   6. Shortness of breath    Recommended an oral prednisone course.  Offered her Allegra instead of Zyrtec.  Patient will consider this.  She declined cough medications.  Discussed antibiotic stewardship and will defer for now.  However, if patient continues to have symptoms by the end of this week, will consider an antibiotic course based off of her symptoms that.  If she has developed a cough, chest pain, pleuritic shortness of breath then recheck in the clinic. Counseled patient on potential for adverse effects with medications prescribed/recommended today, ER and return-to-clinic precautions discussed, patient verbalized understanding.    Jaynee Eagles, [place] 03/23/22 1749

## 2022-03-24 LAB — NOVEL CORONAVIRUS, NAA: SARS-CoV-2, NAA: NOT DETECTED

## 2022-04-15 ENCOUNTER — Ambulatory Visit: Payer: 59

## 2022-04-15 ENCOUNTER — Other Ambulatory Visit (HOSPITAL_COMMUNITY): Payer: Self-pay

## 2022-04-15 ENCOUNTER — Other Ambulatory Visit: Payer: Self-pay

## 2022-04-15 VITALS — BP 131/76 | HR 86 | Temp 97.6°F | Resp 24 | Ht 68.5 in | Wt 156.5 lb

## 2022-04-15 DIAGNOSIS — N3001 Acute cystitis with hematuria: Secondary | ICD-10-CM

## 2022-04-15 DIAGNOSIS — M25562 Pain in left knee: Secondary | ICD-10-CM | POA: Diagnosis not present

## 2022-04-15 DIAGNOSIS — N390 Urinary tract infection, site not specified: Secondary | ICD-10-CM

## 2022-04-15 DIAGNOSIS — R300 Dysuria: Secondary | ICD-10-CM | POA: Diagnosis not present

## 2022-04-15 LAB — POCT URINALYSIS DIPSTICK
Bilirubin, UA: NEGATIVE
Glucose, UA: NEGATIVE
Ketones, UA: NEGATIVE
Leukocytes, UA: NEGATIVE
Nitrite, UA: NEGATIVE
Protein, UA: NEGATIVE
Spec Grav, UA: 1.025 (ref 1.010–1.025)
Urobilinogen, UA: 0.2 E.U./dL
pH, UA: 5.5 (ref 5.0–8.0)

## 2022-04-15 MED ORDER — NITROFURANTOIN MONOHYD MACRO 100 MG PO CAPS
100.0000 mg | ORAL_CAPSULE | Freq: Two times a day (BID) | ORAL | 0 refills | Status: AC
  Filled 2022-04-15: qty 10, 5d supply, fill #0

## 2022-04-15 NOTE — Progress Notes (Deleted)
PMDD w/ gyn, chronic uti with pyelo and hospitalization, LSIL with LEEP, HLD  Victoria Barr endorses a long-term history of chronic dysuria with approximately 2 UTIs per year.  At one point, she did have pyelonephritis that required hospitalization and she states she was nearly septic at that time.  She has seen urology in the past who felt that there may have been correlation with intercourse.  Most recent UTI was approximately 2 weeks ago for which she was seen at urgent care and treated with antibiotics.  Patient denies any dysuria, urgency or frequency today.   Assessment/plan: Previous urine cultures were reviewed and most did not grow out or had insignificant growth.  Due to this, will be difficult to guide empiric antibiotics in the future.  I recommended next time she has symptoms and is concerned for UTI, to make a same-day appointment for urine cultures to be obtained.   If UTIs become more frequent, consider referral to urology for reevaluation.

## 2022-04-15 NOTE — Assessment & Plan Note (Addendum)
Patient has a long standing history of recurent UTIs related to sexual intercourse. She has also had kidney stones in the past when admitted for pyelonephritis. She presents today with left pelvic pain, bladder spasm, dysuria, frequency, urgency. She is afebrile with vital signs wnl, no flank pain, ptp of the bladder. Her UA shows neg leuk est, neg nitrites, moderate blood but no pyuria, which is odd in the setting of infection. The patient has had cipro and nitrofurantoin in the past with success. This is her fist UTI in the past year which she thinks is related to diarrhea. She has not had any abnormal vaginal exams to suggest lichen sclerosis or vaginal atrophy as a cause. I do wonder if there is a stone given the pelvic pain. She has seen urology in the past who did not perform any imaging studies, but do not feel this is necessary at this time given that these are not happening frequently and can potentially be explained by recent diarrheal episodes. Will treat for UTI and follow up microscopy and culture and lack of infectious signs on these may warrant further workup for microscopic hematuria and these symptoms. -Nitrofurantoin 100 mg BID for 5 days -F/U culture and microscopy

## 2022-04-15 NOTE — Patient Instructions (Signed)
Thank you, Ms.Victoria Barr for allowing [place] to provide your care today. Today we discussed :  UTI- You are having symptoms consistent with a UTI. We will treat you with nitrofurantoin twice a day for 5 days. We will also look at your urine under a microscope and culture it to ensure infection is the cause of your symptoms. Please call back if you develop fever, severe lethargy/fatigue, back or flank pain.  Left knee pain- Your knee has no abnormalities on exam. Arthritis can cause periodic swelling and pain of the knee. Injuries to the knee can induce these changes. You can take ibuprofen 400mg  every four to six hours as needed but I would not do this daily. You can get over the counter diclofenac gel to apply to the affected area and this should be used three times daily if you get it. Rest , ice, elevation are good options for symptom relief. Physical therapy or strengthening exercises are important to conserve the joint space and keep function.  I have ordered the following labs for you:   Lab Orders         Culture, Urine         Urinalysis, Complete (81001)         POCT Urinalysis Dipstick )        I have ordered the following medication/changed the following medications:   Stop the following medications: There are no discontinued medications.   Start the following medications: Meds ordered this encounter  Medications   nitrofurantoin, macrocrystal-monohydrate, (MACROBID) 100 MG capsule    Sig: Take 1 capsule (100 mg total) by mouth 2 (two) times daily for 5 days.    Dispense:  10 capsule    Refill:  0     Follow up: As needed for fever, flank or back pain, worsening symptoms despite antibiotics    We look forward to seeing you next time. Please call our clinic at [phone] if you have any questions or concerns. The best time to call is Monday-Friday from 9am-4pm, but there is someone available 24/7. If after hours or the weekend, call the main hospital number and  ask for the Internal Medicine Resident On-Call. If you need medication refills, please notify your pharmacy one week in advance and they will send [phone] a request.   Thank you for trusting me with your care. Wishing you the best!   [place], MD [facility]

## 2022-04-15 NOTE — Assessment & Plan Note (Addendum)
[place] and hurting her knee. She says for the past two weeks there has been swelling of the knee, dull aching, stabbing with weather changes. On exam there is no effusion, no warmth or erythema, no ptp of the patella or joint lines, no crepitus, negative varus and valgus stress, negative mcmurrays. This may be post traumatic arthritis given that the patient does not have other risk factors for arthritis such as obesity or post menopausal. Do not think this is gout given lack of inflammatory signs. Do not suspect DVT given no warmth, erythema, swelling or pain to palpation of the calf. Will not obtain radiographs at this time as will not change management, may consider if no improvement. -Ibuprofen 400mg  q4-6 hrs as needed, not everyday -Voltaren gel 3 times daily -RICE -Patient declined PT, counseled on strengthening exercises

## 2022-04-15 NOTE — Progress Notes (Signed)
Established Patient Office Visit  Subjective   Patient ID: Victoria Barr, female    DOB: 08/28/1971  Age: 51 y.o. MRN: [id number]  Chief Complaint  Patient presents with   Urinary Tract Infection    Symptoms started Sunday, constant pain, taking cranberry supplement- no relief    Ms. Floyd is a 51 y/o female with a pmh outlined below who presents for UTI. Please see A/P for HPI.  Urinary Tract Infection  Associated symptoms include chills, frequency and urgency. Pertinent negatives include no flank pain or hematuria.      Review of Systems  Constitutional:  Positive for chills. Negative for fever.  Genitourinary:  Positive for dysuria, frequency and urgency. Negative for flank pain and hematuria.  Musculoskeletal:  Positive for joint pain.      Objective:     BP 131/76 (BP Location: Right Arm, Patient Position: Sitting, Cuff Size: Normal)   Pulse 86   Temp 97.6 F (36.4 C) (Oral)   Resp (!) 24   Ht 5' 8.5" (1.74 m)   Wt 156 lb 8 oz (71 kg)   LMP 03/28/2022 (Exact Date)   SpO2 100%   BMI 23.45 kg/m    Physical Exam Constitutional:      General: She is in acute distress.     Appearance: Normal appearance. She is normal weight. She is not ill-appearing, toxic-appearing or diaphoretic.  Cardiovascular:     Rate and Rhythm: Normal rate and regular rhythm.     Pulses: Normal pulses.     Heart sounds: Normal heart sounds. No murmur heard.    No gallop.  Pulmonary:     Effort: Pulmonary effort is normal. No respiratory distress.     Breath sounds: Normal breath sounds. No wheezing or rales.  Abdominal:     General: Abdomen is flat. Bowel sounds are normal.     Palpations: Abdomen is soft.     Tenderness: There is no abdominal tenderness. There is no right CVA tenderness or left CVA tenderness.  Genitourinary:    Comments: PTP of the bladder Musculoskeletal:     Right lower leg: Edema present.     Left lower leg: Edema present.     Comments: No  swelling, erythema, warmth, crepitus, joint line tenderness, ptp of the left knee. Negative varus and valgus stress tests, negative mcmurray test.  Skin:    General: Skin is warm and dry.     Capillary Refill: Capillary refill takes less than 2 seconds.  Neurological:     Mental Status: She is alert.      Results for orders placed or performed in visit on 04/15/22  POCT Urinalysis Dipstick (81002)  Result Value Ref Range   Color, UA yellow    Clarity, UA slightly cloudy    Glucose, UA Negative Negative   Bilirubin, UA negative    Ketones, UA negatice    Spec Grav, UA 1.025 1.010 - 1.025   Blood, UA Moderate    pH, UA 5.5 5.0 - 8.0   Protein, UA Negative Negative   Urobilinogen, UA 0.2 0.2 or 1.0 E.U./dL   Nitrite, UA Negative    Leukocytes, UA Negative Negative      The 10-year ASCVD risk score (Arnett DK, et al., 2019) is: 0.8%    Assessment & Plan:   Problem List Items Addressed This Visit       Genitourinary   UTI (urinary tract infection)    Patient has a long standing history of recurent UTIs  related to sexual intercourse. She has also had kidney stones in the past when admitted for pyelonephritis. She presents today with left pelvic pain, bladder spasm, dysuria, frequency, urgency. She is afebrile with vital signs wnl, no flank pain, ptp of the bladder. Her UA shows neg leuk est, neg nitrites, moderate blood but no pyuria, which is odd in the setting of infection. The patient has had cipro and nitrofurantoin in the past with success. This is her fist UTI in the past year which she thinks is related to diarrhea. She has not had any abnormal vaginal exams to suggest lichen sclerosis or vaginal atrophy as a cause. I do wonder if there is a stone given the pelvic pain. She has seen urology in the past who did not perform any imaging studies, but do not feel this is necessary at this time given that these are not happening frequently and can potentially be explained by recent  diarrheal episodes. Will treat for UTI and follow up microscopy and culture and lack of infectious signs on these may warrant further workup for microscopic hematuria and these symptoms. -Nitrofurantoin 100 mg BID for 5 days -F/U culture and microscopy      Relevant Medications   nitrofurantoin, macrocrystal-monohydrate, (MACROBID) 100 MG capsule   Other Relevant Orders   Urinalysis, Complete (81001)   Culture, Urine     Other   Dysuria - Primary   Relevant Orders   POCT Urinalysis Dipstick (16967) (Completed)   Knee pain, left    Patient [place] and hurting her knee. She says for the past two weeks there has been swelling of the knee, dull aching, stabbing with weather changes. On exam there is no effusion, no warmth or erythema, no ptp of the patella or joint lines, no crepitus, negative varus and valgus stress, negative mcmurrays. This may be post traumatic arthritis given that the patient does not have other risk factors for arthritis such as obesity or post menopausal. Do not think this is gout given lack of inflammatory signs. Do not suspect DVT given no warmth, erythema, swelling or pain to palpation of the calf. Will not obtain radiographs at this time as will not change management, may consider if no improvement. -Ibuprofen 400mg  q4-6 hrs as needed, not everyday -Voltaren gel 3 times daily -RICE -Patient declined PT, counseled on strengthening exercises        No follow-ups on file.    , MD

## 2022-04-16 LAB — URINALYSIS, COMPLETE
Bilirubin, UA: NEGATIVE
Glucose, UA: NEGATIVE
Ketones, UA: NEGATIVE
Nitrite, UA: NEGATIVE
Protein,UA: NEGATIVE
RBC, UA: NEGATIVE
Specific Gravity, UA: 1.018 (ref 1.005–1.030)
Urobilinogen, Ur: 0.2 mg/dL (ref 0.2–1.0)
pH, UA: 5 (ref 5.0–7.5)

## 2022-04-16 LAB — MICROSCOPIC EXAMINATION: Casts: NONE SEEN /lpf

## 2022-04-16 NOTE — Progress Notes (Signed)
Internal Medicine Clinic Attending  I saw and evaluated the patient.  I personally confirmed the key portions of the history and exam documented by Dr. Aundria Rud and I reviewed pertinent patient test results.  The assessment, diagnosis, and plan were formulated together and I agree with the documentation in the resident's note. Symptoms dysuria, frequency and urgency c/f uncomplicated UTI. POC urine with moderate blood, no LE. Urine sent for microscopy and culture, will treat given consistent symptoms.

## 2022-04-17 LAB — URINE CULTURE: Organism ID, Bacteria: NO GROWTH

## 2022-05-07 ENCOUNTER — Ambulatory Visit: Payer: 59 | Admitting: Sports Medicine

## 2022-05-27 DIAGNOSIS — R030 Elevated blood-pressure reading, without diagnosis of hypertension: Secondary | ICD-10-CM | POA: Diagnosis not present

## 2022-05-27 DIAGNOSIS — Z6823 Body mass index (BMI) 23.0-23.9, adult: Secondary | ICD-10-CM | POA: Diagnosis not present

## 2022-05-27 DIAGNOSIS — J0100 Acute maxillary sinusitis, unspecified: Secondary | ICD-10-CM | POA: Diagnosis not present

## 2022-06-25 ENCOUNTER — Encounter: Payer: Self-pay | Admitting: *Deleted

## 2022-06-25 NOTE — Progress Notes (Signed)
I spoke with Angelicia Lessner by phone 06-24-2022 in reference to outstanding lab orders created by Dr Allyson Sabal 06/16/203.  Kristene requested that we fax a copy of the lab orders to her at [phone] so she can have the blood drawn at [facility].  This location is more convenient due to her work schedule.  Lab orders for Hemoglobin A1c, BMP, Lipid Profile, and Vitamin D 25 hydroxy were faxed to the number above 06-25-2022 at 11:16 am.  Maryan Rued, [facility]

## 2022-07-22 ENCOUNTER — Other Ambulatory Visit: Payer: Self-pay | Admitting: Obstetrics and Gynecology

## 2022-07-22 DIAGNOSIS — Z1231 Encounter for screening mammogram for malignant neoplasm of breast: Secondary | ICD-10-CM

## 2022-08-22 DIAGNOSIS — B001 Herpesviral vesicular dermatitis: Secondary | ICD-10-CM | POA: Diagnosis not present

## 2022-08-22 DIAGNOSIS — R350 Frequency of micturition: Secondary | ICD-10-CM | POA: Diagnosis not present

## 2022-08-22 DIAGNOSIS — R300 Dysuria: Secondary | ICD-10-CM | POA: Diagnosis not present

## 2022-08-22 DIAGNOSIS — Z6822 Body mass index (BMI) 22.0-22.9, adult: Secondary | ICD-10-CM | POA: Diagnosis not present

## 2022-09-03 ENCOUNTER — Encounter: Payer: Self-pay | Admitting: Obstetrics and Gynecology

## 2022-09-04 ENCOUNTER — Other Ambulatory Visit: Payer: Self-pay

## 2022-09-04 DIAGNOSIS — N926 Irregular menstruation, unspecified: Secondary | ICD-10-CM

## 2022-09-04 DIAGNOSIS — N939 Abnormal uterine and vaginal bleeding, unspecified: Secondary | ICD-10-CM

## 2022-09-04 NOTE — Telephone Encounter (Signed)
FYI. Please advise if you would also like pt to have [place] as well. Thanks.

## 2022-09-04 NOTE — Telephone Encounter (Signed)
Shaffer, Claudia  Karthikeya Funke D, CMA Left message for patient to call and schedule appointment. 

## 2022-09-04 NOTE — Telephone Encounter (Signed)
Please schedule pelvic US and possible endometrial biopsy with me.

## 2022-09-08 NOTE — Telephone Encounter (Signed)
FYI. Pt scheduled for 10/20/2022.

## 2022-09-18 DIAGNOSIS — Z1231 Encounter for screening mammogram for malignant neoplasm of breast: Secondary | ICD-10-CM

## 2022-09-23 ENCOUNTER — Ambulatory Visit
Admission: RE | Admit: 2022-09-23 | Discharge: 2022-09-23 | Disposition: A | Discharge status: Home / Self Care | Payer: 59 | Source: Ambulatory Visit | Attending: Obstetrics and Gynecology | Admitting: Obstetrics and Gynecology

## 2022-09-23 DIAGNOSIS — Z1231 Encounter for screening mammogram for malignant neoplasm of breast: Secondary | ICD-10-CM

## 2022-10-08 NOTE — Progress Notes (Signed)
GYNECOLOGY  VISIT   HPI: 52 y.o.   Single  Caucasian  female   [id number] with Patient's last menstrual period was 10/08/2022.   here for   U/S consult and possible endometrial biopsy.   Having periods every 2 weeks and cramping in between cycles for the last 3 months.  Last month did not even have 2 weeks between cycles.  12/14 - 4 - 5 days.  12/23 - 5 days. 10/08/22 - 5 days.  Takes ibuprofen for the first 2 days of her cycle to treat pain.   She stopped Micronor 2 - 3 months ago.  Took pills June, July, August of 2023 and felt they were not helping PMS.   Has been experiencing a lot of stress.   Has lot about 20 pounds.   Always having PMS now.   Vaping.  GYNECOLOGIC HISTORY: Patient's last menstrual period was 10/08/2022. Contraception:  tubal ligation Menopausal hormone therapy:  n/a Last mammogram:  09/23/22 Breast Density Category B, BI-RADS CATEGORY 1 Neg Last pap smear:   07/30/20 neg: HR HPV neg, 07/15/17 neg Hx LEEP.         OB History     Gravida  1   Para  1   Term  1   Preterm  0   AB  0   Living  1      SAB  0   IAB  0   Ectopic  0   Multiple  0   Live Births  1              Patient Active Problem List   Diagnosis Date Noted   UTI (urinary tract infection) 04/15/2022   Knee pain, left 04/15/2022   Constipation 02/20/2022   Chronic lumbar pain 04/08/2021   Moderate mixed hyperlipidemia not requiring statin therapy 02/12/2021   Chronic sinusitis 02/12/2021   Migraines 02/12/2021   DDD (degenerative disc disease), lumbar 09/18/2019   PMDD (premenstrual dysphoric disorder) 07/07/2019   Vitamin D deficiency 05/23/2015   Depression 05/23/2015   Smoker 05/02/2014   LGSIL (low grade squamous intraepithelial dysplasia) 05/02/2014   VIN I (vulvar intraepithelial neoplasia I) 05/02/2014   MVP (mitral valve prolapse)    Dysuria 06/09/2013    Past Medical History:  Diagnosis Date   Allergy    Anxiety    Arthritis    Depression     GERD (gastroesophageal reflux disease) 04/08/2016   History of abnormal cervical Pap smear 2006, 2015   2015 Neg with + HR HPV, 09/2014 ASCUS with + HR HPV, colpo with LEEP 11/2014-LGSIL (CIN-I)   Hyperlipidemia    Intestinal malrotation    MVP (mitral valve prolapse)    echo in 2012 did not show MVP but did have mild elongation of the AMVL   Palpitations    Pyelonephritis 03/2015   evaluated by urology (Dr. Vernie Ammons)   Recurrent UTI    Tachycardia    Urgency of urination 02/20/2022    Past Surgical History:  Procedure Laterality Date   APPENDECTOMY  1999   CARDIAC CATHETERIZATION N/A 04/01/2016   Procedure: Left Heart Cath and Coronary Angiography;  Surgeon: Kathleene Hazel, MD;  Location: [facility] INVASIVE CV LAB;  Service: Cardiovascular;  Laterality: N/A;   CERVICAL BIOPSY  W/ LOOP ELECTRODE EXCISION  11/28/14   LGSIL with negative margins   COLPOSCOPY  2006, 2015   2006 LGSIL, normal since; 2015 LGSIL w/LEEP   EXCISION MORTON'S NEUROMA Bilateral 2002   FOOT SURGERY  HEMORRHOID SURGERY     INTESTINAL MALROTATION REPAIR  1999   NASAL SINUS SURGERY     TUBAL LIGATION      Current Outpatient Medications  Medication Sig Dispense Refill   azithromycin (ZITHROMAX Z-PAK) 250 MG tablet Take 2 talets by mouth on day 1, then 1 tablet daily after that 6 each 3   Cholecalciferol 125 MCG (5000 UT) TABS Take by mouth.     Cranberry 1000 MG CAPS Take by mouth.     diclofenac (VOLTAREN) 75 MG EC tablet Take 1 tablet (75 mg total) by mouth 2 (two) times daily with food 60 tablet 0   fluticasone (FLONASE) 50 MCG/ACT nasal spray Place 2 sprays into both nostrils daily. 16 g 2   ibuprofen (ADVIL) 800 MG tablet Take 1 tablet (800 mg total) by mouth every 8 (eight) hours as needed. 60 tablet 2   Multiple Vitamin (MULTIVITAMIN) capsule Take 1 capsule by mouth daily.     No current facility-administered medications for this visit.     ALLERGIES: Imitrex [sumatriptan], Iohexol, Penicillins, Prozac  [fluoxetine hcl], Sulfa antibiotics, Zoloft [sertraline hcl], Iodinated contrast media, and Shellfish allergy  Family History  Problem Relation Age of Onset   Heart disease Mother    Hypertension Mother    Aneurysm Father 13       brain    Hypertension Sister    Heart disease Maternal Grandmother    Cancer Paternal Grandmother    Cancer Paternal Grandfather    Breast cancer Maternal Aunt    BRCA 1/2 Cousin        negative    Social History   Socioeconomic History   Marital status: Single    Spouse name: Not on file   Number of children: Not on file   Years of education: Not on file   Highest education level: Not on file  Occupational History   Not on file  Tobacco Use   Smoking status: Former    Packs/day: 0.50    Types: Cigarettes    Quit date: 2022    Years since quitting: 2.0   Smokeless tobacco: Never   Tobacco comments:    2 cigarettes a day  Vaping Use   Vaping Use: Every day   Substances: Nicotine  Substance and Sexual Activity   Alcohol use: Yes    Comment: 2 drinks/month   Drug use: No   Sexual activity: Not Currently    Partners: Male    Birth control/protection: Surgical    Comment: BTL  Other Topics Concern   Not on file  Social History Narrative   Left handed    Social Determinants of Health   Financial Resource Strain: Not on file  Food Insecurity: Not on file  Transportation Needs: Not on file  Physical Activity: Not on file  Stress: Not on file  Social Connections: Not on file  Intimate Partner Violence: Not on file    Review of Systems  All other systems reviewed and are negative.   PHYSICAL EXAMINATION:    BP 120/74 (BP Location: Right Arm, Patient Position: Sitting, Cuff Size: Normal)   Pulse 73   Ht 5\' 8"  (1.727 m)   Wt 151 lb (68.5 kg)   LMP 10/08/2022   SpO2 100%   BMI 22.96 kg/m     General appearance: alert, cooperative and appears stated age   Pelvic US Uterus  7.84 x 5.71 x 4.36 cm.  No myometrial masses.  EMS  7.83 mm.  No endometrial  masses.  Left ovary 2.4 x 1.65 x 1.12 cm.  Right ovary 2.66 x 1.95 x 1.59 cm.  Follicle 85.4 mm.  No adnexal masses.  No free fluid.  ASSESSMENT  Abnormal uterine bleeding.  Off Micronor.  Vaping.   PLAN  Pelvic US images and report reviewed.  Etiologies of bleeding reviewed:  perimenopausal anovulation, polyps, infection, hyperplasia and malignancy.  Return for endometrial biopsy.  Procedure and rationale explained.  We discussed possible treatments options for abnormal bleeding:  progesterone IUD, endometrial ablation, and hysterectomy.  Patient declines hysterectomy at this time.    An After Visit Summary was printed and given to the patient.  20 min  total time was spent for this patient encounter, including preparation, face-to-face counseling with the patient, coordination of care, and documentation of the encounter.

## 2022-10-16 ENCOUNTER — Other Ambulatory Visit (HOSPITAL_COMMUNITY): Payer: Self-pay

## 2022-10-16 ENCOUNTER — Other Ambulatory Visit: Payer: Self-pay | Admitting: Internal Medicine

## 2022-10-16 MED ORDER — AZITHROMYCIN 250 MG PO TABS
ORAL_TABLET | ORAL | 3 refills | Status: AC
  Filled 2022-10-16: qty 6, 5d supply, fill #0
  Filled 2022-12-04: qty 6, 5d supply, fill #1
  Filled 2023-01-27 – 2023-02-11 (×2): qty 6, 5d supply, fill #2

## 2022-10-20 ENCOUNTER — Encounter: Payer: Self-pay | Admitting: Obstetrics and Gynecology

## 2022-10-20 ENCOUNTER — Ambulatory Visit (INDEPENDENT_AMBULATORY_CARE_PROVIDER_SITE_OTHER): Payer: 59

## 2022-10-20 ENCOUNTER — Ambulatory Visit (INDEPENDENT_AMBULATORY_CARE_PROVIDER_SITE_OTHER): Payer: 59 | Admitting: Obstetrics and Gynecology

## 2022-10-20 VITALS — BP 120/74 | HR 73 | Ht 68.0 in | Wt 151.0 lb

## 2022-10-20 DIAGNOSIS — N939 Abnormal uterine and vaginal bleeding, unspecified: Secondary | ICD-10-CM

## 2022-10-20 DIAGNOSIS — N926 Irregular menstruation, unspecified: Secondary | ICD-10-CM

## 2022-10-20 NOTE — Patient Instructions (Signed)
Intrauterine Device Information An intrauterine device (IUD) is a medical device that is inserted into the uterus to prevent pregnancy. It is a small, T-shaped device that has one or two nylon strings hanging down from it. The strings hang out of the lower part of the uterus (cervix) to allow for future IUD removal. There are two types of IUDs: Hormone IUD. This type of IUD is made of plastic and contains the hormone progestin (synthetic progesterone). A hormone IUD may last 3-5 years. Copper IUD. This type of IUD has copper wire wrapped around it. A copper IUD may last up to 10 years. How is an IUD inserted? An IUD is inserted through the vagina, through the cervix, and into the uterus with a minor medical procedure. The procedure for IUD insertion may vary among health care providers and hospitals. How does an IUD work? Synthetic progesterone in a hormonal IUD prevents pregnancy by: Thickening cervical mucus to prevent sperm from entering the uterus. Thinning the uterine lining to prevent a fertilized egg from being implanted there. Copper in a copper IUD prevents pregnancy by making the uterus and fallopian tubes produce a fluid that kills sperm. What are the advantages of an IUD? Advantages of either type of IUD An IUD: Is highly effective in preventing pregnancy. Is reversible. You can become pregnant shortly after the IUD is removed. Is low-maintenance and can stay in place for a long time. Has no estrogen-related side effects. Can be used when breastfeeding. Is not associated with weight gain. Can be inserted right after childbirth, an abortion, or a miscarriage. Advantages of a hormone IUD If it is inserted within 7 days of your period starting, it works right after it has been inserted. If the hormone IUD is inserted at any other time in your cycle, you will need to use a backup method of birth control for 7 days after insertion. It can make menstrual periods lighter or stop  completely. It can reduce menstrual cramping and other discomforts from menstrual periods. It can be used for 3-5 years, depending on which IUD you have. Advantages of a copper IUD It works right after it is inserted. It can be used as a form of emergency birth control if it is inserted within 5 days after having unprotected sex. It does not interfere with your body's natural hormones. It can be used for up to 10 years. What are the disadvantages of an IUD? An IUD may cause irregular menstrual bleeding for a period of time after insertion. It is common to have pain during insertion and have cramping and vaginal bleeding after insertion. An IUD may cut the uterus (uterine perforation) when it is inserted. This is rare. Pelvic inflammatory disease (PID) may happen after insertion of an IUD. PID is an infection in the uterus and fallopian tubes. The IUD does not cause the infection. The infection is usually from an unknown sexually transmitted infection (STI). This is rare, and it usually happens during the first 20 days after the IUD is inserted. A copper IUD can make your menstrual flow heavier and more painful. IUDs cannot prevent sexually transmitted infections (STIs). How is an IUD removed?  You will lie on your back with your knees bent and your feet in footrests (stirrups). A device will be inserted into your vagina to spread apart the vaginal walls (speculum). This will allow your health care provider to see the strings attached to the IUD. Your health care provider will use a small instrument (forceps) to   grasp the IUD strings and will pull firmly until the IUD is removed. You may have some discomfort when the IUD is removed. Your health care provider may recommend taking over-the-counter pain relievers, such as ibuprofen, before the procedure. You may also have minor spotting for a few days after the procedure. The procedure for IUD removal may vary among health care providers and  hospitals. Is an IUD right for me? If you are interested in an IUD, discuss it with your health care provider. He or she will make sure you are a good candidate for an IUD and will let you know more about the advantages, disadvantage, and possible side effects. This will allow you to make a decision about the device. Summary An intrauterine device (IUD) is a medical device that is inserted in the uterus to prevent pregnancy. It is a small, T-shaped device that has one or two nylon strings hanging down from it. A hormone IUD contains the hormone progestin (synthetic progesterone). A copper IUD has copper wire wrapped around it. Synthetic progesterone in a hormone IUD prevents pregnancy by thickening cervical mucus and thinning the walls of the uterus. Copper in a copper IUD prevents pregnancy by making the uterus and fallopian tubes produce a fluid that kills sperm. A hormone IUD can be left in place for 3-5 years. A copper IUD can be left in place for up to 10 years. An IUD is inserted and removed by a health care provider. You may feel some pain during insertion and removal. Your health care provider may recommend taking over-the-counter pain medicine, such as ibuprofen, before an IUD procedure. This information is not intended to replace advice given to you by your health care provider. Make sure you discuss any questions you have with your health care provider. Document Revised: 03/20/2020 Document Reviewed: 03/20/2020 Elsevier Patient Education  [place].   Endometrial Ablation Endometrial ablation is a procedure that destroys the thin inner layer of the lining of the uterus (endometrium). This procedure may be done: To stop heavy menstrual periods. To stop bleeding that is causing anemia. To control irregular bleeding. To treat bleeding caused by small tumors (fibroids) in the endometrium. This procedure is often done as an alternative to major surgery, such as removal of the  uterus and cervix (hysterectomy). As a result of this procedure: You may not be able to have children. However, if you have not yet gone through menopause: You may still have a small chance of getting pregnant. You will need to use a reliable method of birth control after the procedure to prevent pregnancy. You may stop having a menstrual period, or you may have only a small amount of bleeding during your period. Menstruation may return several years after the procedure. Tell a health care provider about: Any allergies you have. All medicines you are taking, including vitamins, herbs, eye drops, creams, and over-the-counter medicines. Any problems you or family members have had with the use of anesthetic medicines. Any blood disorders you have. Any surgeries you have had. Any medical conditions you have. Whether you are pregnant or may be pregnant. What are the risks? Generally, this is a safe procedure. However, problems may occur, including: A hole (perforation) in the uterus or bowel. Infection in the uterus, bladder, or vagina. Bleeding. Allergic reaction to medicines. Damage to nearby structures or organs. An air bubble in the lung (air embolus). Problems with pregnancy. Failure of the procedure. Decreased ability to diagnose cancer in the endometrium. Scar tissue  forms after the procedure, making it more difficult to get a sample of the uterine lining. What happens before the procedure? Medicines Ask your health care provider about: Changing or stopping your regular medicines. This is especially important if you take diabetes medicines or blood thinners. Taking medicines such as aspirin and ibuprofen. These medicines can thin your blood. Do not take these medicines before your procedure if your doctor tells you not to take them. Taking over-the-counter medicines, vitamins, herbs, and supplements. Tests You will have tests of your endometrium to make sure there are no precancerous  cells or cancer cells present. You may have an ultrasound of the uterus. General instructions Do not use any products that contain nicotine or tobacco for at least 4 weeks before the procedure. These include cigarettes, chewing tobacco, and vaping devices, such as e-cigarettes. If you need help quitting, ask your health care provider. You may be given medicines to thin the endometrium. Ask your health care provider what steps will be taken to help prevent infection. These steps may include: Removing hair at the surgery site. Washing skin with a germ-killing soap. Taking antibiotic medicine. Plan to have a responsible adult take you home from the hospital or clinic. Plan to have a responsible adult care for you for the time you are told after you leave the hospital or clinic. This is important. What happens during the procedure?  You will lie on an exam table with your feet and legs supported as in a pelvic exam. An IV will be inserted into one of your veins. You will be given a medicine to help you relax (sedative). A surgical tool with a light and camera (resectoscope) will be inserted into your vagina and moved into your uterus. This allows your surgeon to see inside your uterus. Endometrial tissue will be destroyed and removed, using one of the following methods: Radiofrequency. This uses an electrical current to destroy the endometrium. Cryotherapy. This uses extreme cold to freeze the endometrium. Heated fluid. This uses a heated salt and water (saline) solution to destroy the endometrium. Microwave. This uses high-energy microwaves to heat up the endometrium and destroy it. Thermal balloon. This involves inserting a catheter with a balloon tip into the uterus. The balloon tip is filled with heated fluid to destroy the endometrium. The procedure may vary among health care providers and hospitals. What happens after the procedure? Your blood pressure, heart rate, breathing rate, and  blood oxygen level will be monitored until you leave the hospital or clinic. You may have vaginal bleeding for 4-6 weeks after the procedure. You may also have: Cramps. A thin, watery vaginal discharge that is light pink or brown. A need to urinate more than usual. Nausea. If you were given a sedative during the procedure, it can affect you for several hours. Do not drive or operate machinery until your health care provider says that it is safe. Do not have sex or insert anything into your vagina until your health care provider says it is safe. Summary Endometrial ablation is done to treat many causes of heavy menstrual bleeding. The procedure destroys the thin inner layer of the lining of the uterus (endometrium). This procedure is often done as an alternative to major surgery, such as removal of the uterus and cervix (hysterectomy). Plan to have a responsible adult take you home from the hospital or clinic. This information is not intended to replace advice given to you by your health care provider. Make sure you discuss any questions  you have with your health care provider. Document Revised: 03/28/2020 Document Reviewed: 03/28/2020 Elsevier Patient Education  [place].

## 2022-11-10 NOTE — Progress Notes (Deleted)
GYNECOLOGY  VISIT   HPI: 52 y.o.   Single  Caucasian  female   [id number] with No LMP recorded.   here for   endo bx  GYNECOLOGIC HISTORY: No LMP recorded. Contraception:  *** Menopausal hormone therapy:  n/a Last mammogram:  09/23/22 Breast Density Category B, BI-RADS CATEGORY 1 Neg Last pap smear:   07/30/20 neg : HR HPV neg        OB History     Gravida  1   Para  1   Term  1   Preterm  0   AB  0   Living  1      SAB  0   IAB  0   Ectopic  0   Multiple  0   Live Births  1              Patient Active Problem List   Diagnosis Date Noted   UTI (urinary tract infection) 04/15/2022   Knee pain, left 04/15/2022   Constipation 02/20/2022   Chronic lumbar pain 04/08/2021   Moderate mixed hyperlipidemia not requiring statin therapy 02/12/2021   Chronic sinusitis 02/12/2021   Migraines 02/12/2021   DDD (degenerative disc disease), lumbar 09/18/2019   PMDD (premenstrual dysphoric disorder) 07/07/2019   Vitamin D deficiency 05/23/2015   Depression 05/23/2015   Smoker 05/02/2014   LGSIL (low grade squamous intraepithelial dysplasia) 05/02/2014   VIN I (vulvar intraepithelial neoplasia I) 05/02/2014   MVP (mitral valve prolapse)    Dysuria 06/09/2013    Past Medical History:  Diagnosis Date   Allergy    Anxiety    Arthritis    Depression    GERD (gastroesophageal reflux disease) 04/08/2016   History of abnormal cervical Pap smear 2006, 2015   2015 Neg with + HR HPV, 09/2014 ASCUS with + HR HPV, colpo with LEEP 11/2014-LGSIL (CIN-I)   Hyperlipidemia    Intestinal malrotation    MVP (mitral valve prolapse)    echo in 2012 did not show MVP but did have mild elongation of the AMVL   Palpitations    Pyelonephritis 03/2015   evaluated by urology (Dr. Karsten Ro)   Recurrent UTI    Tachycardia    Urgency of urination 02/20/2022    Past Surgical History:  Procedure Laterality Date   APPENDECTOMY  1999   CARDIAC CATHETERIZATION N/A 04/01/2016   Procedure: Left  Heart Cath and Coronary Angiography;  Surgeon: Burnell Blanks, MD;  Location: [place] CV LAB;  Service: Cardiovascular;  Laterality: N/A;   CERVICAL BIOPSY  W/ LOOP ELECTRODE EXCISION  11/28/14   LGSIL with negative margins   COLPOSCOPY  2006, 2015   2006 LGSIL, normal since; 2015 LGSIL w/LEEP   EXCISION MORTON'S NEUROMA Bilateral 2002   FOOT SURGERY     HEMORRHOID SURGERY     INTESTINAL MALROTATION REPAIR  1999   NASAL SINUS SURGERY     TUBAL LIGATION      Current Outpatient Medications  Medication Sig Dispense Refill   azithromycin (ZITHROMAX Z-PAK) 250 MG tablet Take 2 talets by mouth on day 1, then 1 tablet daily after that 6 each 3   Cholecalciferol 125 MCG (5000 UT) TABS Take by mouth.     Cranberry 1000 MG CAPS Take by mouth.     diclofenac (VOLTAREN) 75 MG EC tablet Take 1 tablet (75 mg total) by mouth 2 (two) times daily with food 60 tablet 0   fluticasone (FLONASE) 50 MCG/ACT nasal spray Place 2 sprays into  both nostrils daily. 16 g 2   ibuprofen (ADVIL) 800 MG tablet Take 1 tablet (800 mg total) by mouth every 8 (eight) hours as needed. 60 tablet 2   Multiple Vitamin (MULTIVITAMIN) capsule Take 1 capsule by mouth daily.     No current facility-administered medications for this visit.     ALLERGIES: Imitrex [sumatriptan], Iohexol, Penicillins, Prozac [fluoxetine hcl], Sulfa antibiotics, Zoloft [sertraline hcl], Iodinated contrast media, and Shellfish allergy  Family History  Problem Relation Age of Onset   Heart disease Mother    Hypertension Mother    Aneurysm Father 2       brain    Hypertension Sister    Heart disease Maternal Grandmother    Cancer Paternal Grandmother    Cancer Paternal Grandfather    Breast cancer Maternal Aunt    BRCA 1/2 Cousin        negative    Social History   Socioeconomic History   Marital status: Single    Spouse name: Not on file   Number of children: Not on file   Years of education: Not on file   Highest education  level: Not on file  Occupational History   Not on file  Tobacco Use   Smoking status: Former    Packs/day: 0.50    Types: Cigarettes    Quit date: 2022    Years since quitting: 2.1   Smokeless tobacco: Never   Tobacco comments:    2 cigarettes a day  Vaping Use   Vaping Use: Every day   Substances: Nicotine  Substance and Sexual Activity   Alcohol use: Yes    Comment: 2 drinks/month   Drug use: No   Sexual activity: Not Currently    Partners: Male    Birth control/protection: Surgical    Comment: BTL  Other Topics Concern   Not on file  Social History Narrative   Left handed    Social Determinants of Health   Financial Resource Strain: Not on file  Food Insecurity: Not on file  Transportation Needs: Not on file  Physical Activity: Not on file  Stress: Not on file  Social Connections: Not on file  Intimate Partner Violence: Not on file    Review of Systems  PHYSICAL EXAMINATION:    There were no vitals taken for this visit.    General appearance: alert, cooperative and appears stated age Head: Normocephalic, without obvious abnormality, atraumatic Neck: no adenopathy, supple, symmetrical, trachea midline and thyroid normal to inspection and palpation Lungs: clear to auscultation bilaterally Breasts: normal appearance, no masses or tenderness, No nipple retraction or dimpling, No nipple discharge or bleeding, No axillary or supraclavicular adenopathy Heart: regular rate and rhythm Abdomen: soft, non-tender, no masses,  no organomegaly Extremities: extremities normal, atraumatic, no cyanosis or edema Skin: Skin color, texture, turgor normal. No rashes or lesions Lymph nodes: Cervical, supraclavicular, and axillary nodes normal. No abnormal inguinal nodes palpated Neurologic: Grossly normal  Pelvic: External genitalia:  no lesions              Urethra:  normal appearing urethra with no masses, tenderness or lesions              Bartholins and Skenes: normal                  Vagina: normal appearing vagina with normal color and discharge, no lesions              Cervix: no lesions  Bimanual Exam:  Uterus:  normal size, contour, position, consistency, mobility, non-tender              Adnexa: no mass, fullness, tenderness              Rectal exam: {yes no:[id number]}.  Confirms.              Anus:  normal sphincter tone, no lesions  Chaperone was present for exam:  ***  ASSESSMENT     PLAN     An After Visit Summary was printed and given to the patient.  ______ minutes face to face time of which over 50% was spent in counseling.

## 2022-11-23 ENCOUNTER — Other Ambulatory Visit: Payer: Self-pay | Admitting: Internal Medicine

## 2022-11-23 DIAGNOSIS — R0609 Other forms of dyspnea: Secondary | ICD-10-CM

## 2022-11-23 DIAGNOSIS — N939 Abnormal uterine and vaginal bleeding, unspecified: Secondary | ICD-10-CM

## 2022-11-23 DIAGNOSIS — F17200 Nicotine dependence, unspecified, uncomplicated: Secondary | ICD-10-CM

## 2022-11-23 DIAGNOSIS — K5900 Constipation, unspecified: Secondary | ICD-10-CM

## 2022-11-23 DIAGNOSIS — E782 Mixed hyperlipidemia: Secondary | ICD-10-CM

## 2022-11-23 DIAGNOSIS — N926 Irregular menstruation, unspecified: Secondary | ICD-10-CM

## 2022-11-23 DIAGNOSIS — R739 Hyperglycemia, unspecified: Secondary | ICD-10-CM

## 2022-11-24 ENCOUNTER — Ambulatory Visit: Payer: 59 | Admitting: Obstetrics and Gynecology

## 2022-11-24 ENCOUNTER — Encounter: Payer: Self-pay | Admitting: Obstetrics and Gynecology

## 2022-12-04 ENCOUNTER — Other Ambulatory Visit: Payer: Self-pay | Admitting: Sports Medicine

## 2022-12-04 ENCOUNTER — Other Ambulatory Visit: Payer: Self-pay

## 2022-12-08 DIAGNOSIS — J0140 Acute pansinusitis, unspecified: Secondary | ICD-10-CM | POA: Diagnosis not present

## 2022-12-08 DIAGNOSIS — M791 Myalgia, unspecified site: Secondary | ICD-10-CM | POA: Diagnosis not present

## 2022-12-08 DIAGNOSIS — Z6822 Body mass index (BMI) 22.0-22.9, adult: Secondary | ICD-10-CM | POA: Diagnosis not present

## 2022-12-22 NOTE — Progress Notes (Deleted)
GYNECOLOGY  VISIT   HPI: 52 y.o.   Single  Caucasian  female   [id number] with No LMP recorded.   here for   endo bx  GYNECOLOGIC HISTORY: No LMP recorded. Contraception:  BTL Menopausal hormone therapy:  n/a Last mammogram:   09/23/22 Breast Density Category B, BI-RADS CATEGORY 1 Neg  Last pap smear:   07/30/20 neg: HR HPV neg, 07/15/17 neg Hx LEEP.         OB History     Gravida  1   Para  1   Term  1   Preterm  0   AB  0   Living  1      SAB  0   IAB  0   Ectopic  0   Multiple  0   Live Births  1              Patient Active Problem List   Diagnosis Date Noted   UTI (urinary tract infection) 04/15/2022   Knee pain, left 04/15/2022   Constipation 02/20/2022   Chronic lumbar pain 04/08/2021   Moderate mixed hyperlipidemia not requiring statin therapy 02/12/2021   Chronic sinusitis 02/12/2021   Migraines 02/12/2021   DDD (degenerative disc disease), lumbar 09/18/2019   PMDD (premenstrual dysphoric disorder) 07/07/2019   Vitamin D deficiency 05/23/2015   Depression 05/23/2015   Smoker 05/02/2014   LGSIL (low grade squamous intraepithelial dysplasia) 05/02/2014   VIN I (vulvar intraepithelial neoplasia I) 05/02/2014   MVP (mitral valve prolapse)    Dysuria 06/09/2013    Past Medical History:  Diagnosis Date   Allergy    Anxiety    Arthritis    Depression    GERD (gastroesophageal reflux disease) 04/08/2016   History of abnormal cervical Pap smear 2006, 2015   2015 Neg with + HR HPV, 09/2014 ASCUS with + HR HPV, colpo with LEEP 11/2014-LGSIL (CIN-I)   Hyperlipidemia    Intestinal malrotation    MVP (mitral valve prolapse)    echo in 2012 did not show MVP but did have mild elongation of the AMVL   Palpitations    Pyelonephritis 03/2015   evaluated by urology (Dr. Karsten Ro)   Recurrent UTI    Tachycardia    Urgency of urination 02/20/2022    Past Surgical History:  Procedure Laterality Date   APPENDECTOMY  1999   CARDIAC CATHETERIZATION N/A  04/01/2016   Procedure: Left Heart Cath and Coronary Angiography;  Surgeon: Burnell Blanks, MD;  Location: [facility];  Service: Cardiovascular;  Laterality: N/A;   CERVICAL BIOPSY  W/ LOOP ELECTRODE EXCISION  11/28/14   LGSIL with negative margins   COLPOSCOPY  2006, 2015   2006 LGSIL, normal since; 2015 LGSIL w/LEEP   EXCISION MORTON'S NEUROMA Bilateral 2002   FOOT SURGERY     HEMORRHOID SURGERY     INTESTINAL MALROTATION REPAIR  1999   NASAL SINUS SURGERY     TUBAL LIGATION      Current Outpatient Medications  Medication Sig Dispense Refill   azithromycin (ZITHROMAX Z-PAK) 250 MG tablet Take 2 talets by mouth on day 1, then 1 tablet daily after that 6 each 3   Cholecalciferol 125 MCG (5000 UT) TABS Take by mouth.     Cranberry 1000 MG CAPS Take by mouth.     diclofenac (VOLTAREN) 75 MG EC tablet Take 1 tablet (75 mg total) by mouth 2 (two) times daily with food 60 tablet 0   fluticasone (FLONASE) 50 MCG/ACT  nasal spray Place 2 sprays into both nostrils daily. 16 g 2   ibuprofen (ADVIL) 800 MG tablet Take 1 tablet (800 mg total) by mouth every 8 (eight) hours as needed. 60 tablet 2   Multiple Vitamin (MULTIVITAMIN) capsule Take 1 capsule by mouth daily.     No current facility-administered medications for this visit.     ALLERGIES: Imitrex [sumatriptan], Iohexol, Penicillins, Prozac [fluoxetine hcl], Sulfa antibiotics, Zoloft [sertraline hcl], Iodinated contrast media, and Shellfish allergy  Family History  Problem Relation Age of Onset   Heart disease Mother    Hypertension Mother    Aneurysm Father 34       brain    Hypertension Sister    Heart disease Maternal Grandmother    Cancer Paternal Grandmother    Cancer Paternal Grandfather    Breast cancer Maternal Aunt    BRCA 1/2 Cousin        negative    Social History   Socioeconomic History   Marital status: Single    Spouse name: Not on file   Number of children: Not on file   Years of education: Not  on file   Highest education level: Not on file  Occupational History   Not on file  Tobacco Use   Smoking status: Former    Packs/day: .5    Types: Cigarettes    Quit date: 2022    Years since quitting: 2.2   Smokeless tobacco: Never   Tobacco comments:    2 cigarettes a day  Vaping Use   Vaping Use: Every day   Substances: Nicotine  Substance and Sexual Activity   Alcohol use: Yes    Comment: 2 drinks/month   Drug use: No   Sexual activity: Not Currently    Partners: Male    Birth control/protection: Surgical    Comment: BTL  Other Topics Concern   Not on file  Social History Narrative   Left handed    Social Determinants of Health   Financial Resource Strain: Not on file  Food Insecurity: Not on file  Transportation Needs: Not on file  Physical Activity: Not on file  Stress: Not on file  Social Connections: Not on file  Intimate Partner Violence: Not on file    Review of Systems  PHYSICAL EXAMINATION:    There were no vitals taken for this visit.    General appearance: alert, cooperative and appears stated age Head: Normocephalic, without obvious abnormality, atraumatic Neck: no adenopathy, supple, symmetrical, trachea midline and thyroid normal to inspection and palpation Lungs: clear to auscultation bilaterally Breasts: normal appearance, no masses or tenderness, No nipple retraction or dimpling, No nipple discharge or bleeding, No axillary or supraclavicular adenopathy Heart: regular rate and rhythm Abdomen: soft, non-tender, no masses,  no organomegaly Extremities: extremities normal, atraumatic, no cyanosis or edema Skin: Skin color, texture, turgor normal. No rashes or lesions Lymph nodes: Cervical, supraclavicular, and axillary nodes normal. No abnormal inguinal nodes palpated Neurologic: Grossly normal  Pelvic: External genitalia:  no lesions              Urethra:  normal appearing urethra with no masses, tenderness or lesions               Bartholins and Skenes: normal                 Vagina: normal appearing vagina with normal color and discharge, no lesions              Cervix:  no lesions                Bimanual Exam:  Uterus:  normal size, contour, position, consistency, mobility, non-tender              Adnexa: no mass, fullness, tenderness              Rectal exam: {yes no:[id number]}.  Confirms.              Anus:  normal sphincter tone, no lesions  Chaperone was present for exam:  ***  ASSESSMENT     PLAN     An After Visit Summary was printed and given to the patient.  ______ minutes face to face time of which over 50% was spent in counseling.

## 2022-12-25 DIAGNOSIS — K5900 Constipation, unspecified: Secondary | ICD-10-CM | POA: Diagnosis not present

## 2022-12-26 LAB — CBC
Hematocrit: 40.5 % (ref 34.0–46.6)
Hemoglobin: 13.9 g/dL (ref 11.1–15.9)
MCH: 30.5 pg (ref 26.6–33.0)
MCHC: 34.3 g/dL (ref 31.5–35.7)
MCV: 89 fL (ref 79–97)
Platelets: 268 10*3/uL (ref 150–450)
RBC: 4.56 x10E6/uL (ref 3.77–5.28)
RDW: 12.1 % (ref 11.7–15.4)
WBC: 6.9 10*3/uL (ref 3.4–10.8)

## 2022-12-26 LAB — CMP14+EGFR
ALT: 12 IU/L (ref 0–32)
AST: 15 IU/L (ref 0–40)
Albumin/Globulin Ratio: 1.6 (ref 1.2–2.2)
Albumin: 4.5 g/dL (ref 3.9–4.9)
Alkaline Phosphatase: 88 IU/L (ref 44–121)
BUN/Creatinine Ratio: 21 (ref 9–23)
BUN: 18 mg/dL (ref 6–24)
Bilirubin Total: <0.2 mg/dL (ref 0.0–1.2)
CO2: 21 mmol/L (ref 20–29)
Calcium: 9.8 mg/dL (ref 8.7–10.2)
Chloride: 99 mmol/L (ref 96–106)
Creatinine, Ser: 0.85 mg/dL (ref 0.57–1.00)
Globulin, Total: 2.8 g/dL (ref 1.5–4.5)
Glucose: 106 mg/dL — ABNORMAL HIGH (ref 70–99)
Potassium: 4.2 mmol/L (ref 3.5–5.2)
Sodium: 138 mmol/L (ref 134–144)
Total Protein: 7.3 g/dL (ref 6.0–8.5)
eGFR: 84 mL/min/{1.73_m2} (ref >59)

## 2022-12-26 LAB — LIPID PANEL WITH LDL/HDL RATIO
Cholesterol, Total: 224 mg/dL — ABNORMAL HIGH (ref 100–199)
HDL: 82 mg/dL (ref >39)
LDL Chol Calc (NIH): 126 mg/dL — ABNORMAL HIGH (ref 0–99)
LDL/HDL Ratio: 1.5 ratio (ref 0.0–3.2)
Triglycerides: 92 mg/dL (ref 0–149)
VLDL Cholesterol Cal: 16 mg/dL (ref 5–40)

## 2022-12-26 LAB — VITAMIN D 25 HYDROXY (VIT D DEFICIENCY, FRACTURES): Vit D, 25-Hydroxy: 47.1 ng/mL (ref 30.0–100.0)

## 2022-12-26 LAB — TSH+T4F+T3FREE
Free T4: 1.25 ng/dL (ref 0.82–1.77)
T3, Free: 2.7 pg/mL (ref 2.0–4.4)
TSH: 0.752 u[IU]/mL (ref 0.450–4.500)

## 2022-12-26 LAB — HEPATIC FUNCTION PANEL
ALT: 10 IU/L (ref 0–32)
AST: 16 IU/L (ref 0–40)
Albumin: 4.5 g/dL (ref 3.9–4.9)
Alkaline Phosphatase: 84 IU/L (ref 44–121)
Bilirubin Total: <0.2 mg/dL (ref 0.0–1.2)
Bilirubin, Direct: <0.1 mg/dL (ref 0.00–0.40)
Total Protein: 7.2 g/dL (ref 6.0–8.5)

## 2022-12-26 LAB — HGB A1C W/O EAG: Hgb A1c MFr Bld: 5.6 % (ref 4.8–5.6)

## 2022-12-28 ENCOUNTER — Other Ambulatory Visit (HOSPITAL_COMMUNITY): Payer: Self-pay

## 2022-12-28 ENCOUNTER — Other Ambulatory Visit: Payer: Self-pay | Admitting: Internal Medicine

## 2022-12-28 MED ORDER — ROSUVASTATIN CALCIUM 5 MG PO TABS
5.0000 mg | ORAL_TABLET | Freq: Every day | ORAL | 3 refills | Status: AC
  Filled 2022-12-28: qty 30, 30d supply, fill #0

## 2022-12-31 ENCOUNTER — Encounter: Payer: Self-pay | Admitting: Obstetrics and Gynecology

## 2022-12-31 NOTE — Telephone Encounter (Signed)
Please let patient know that she does not need the endometrial biopsy if her periods are normal.   This biopsy appointment will need to be cancelled.

## 2022-12-31 NOTE — Telephone Encounter (Signed)
Appointment cancelled per CS. Will close encounter.

## 2023-01-05 ENCOUNTER — Ambulatory Visit: Payer: 59 | Admitting: Obstetrics and Gynecology

## 2023-01-14 ENCOUNTER — Other Ambulatory Visit (HOSPITAL_COMMUNITY): Payer: Self-pay

## 2023-01-18 ENCOUNTER — Other Ambulatory Visit: Payer: Self-pay | Admitting: Internal Medicine

## 2023-01-18 DIAGNOSIS — M25552 Pain in left hip: Secondary | ICD-10-CM

## 2023-01-18 NOTE — Addendum Note (Signed)
Addended by: Erby Pian on: 01/18/2023 03:32 PM   Modules accepted: Orders

## 2023-01-26 ENCOUNTER — Other Ambulatory Visit (HOSPITAL_COMMUNITY): Payer: Self-pay

## 2023-02-08 ENCOUNTER — Other Ambulatory Visit (HOSPITAL_COMMUNITY): Payer: Self-pay

## 2023-02-25 ENCOUNTER — Other Ambulatory Visit (HOSPITAL_COMMUNITY): Payer: Self-pay

## 2023-02-25 ENCOUNTER — Other Ambulatory Visit: Payer: Self-pay

## 2023-02-25 MED ORDER — FLUCONAZOLE 150 MG PO TABS
150.0000 mg | ORAL_TABLET | Freq: Every day | ORAL | 11 refills | Status: AC
  Filled 2023-02-25: qty 2, 2d supply, fill #0
  Filled 2023-04-06: qty 2, 2d supply, fill #1

## 2023-03-08 DIAGNOSIS — S63502A Unspecified sprain of left wrist, initial encounter: Secondary | ICD-10-CM | POA: Diagnosis not present

## 2023-03-22 ENCOUNTER — Other Ambulatory Visit (HOSPITAL_COMMUNITY): Payer: Self-pay

## 2023-03-22 ENCOUNTER — Encounter: Payer: Self-pay | Admitting: Obstetrics and Gynecology

## 2023-03-22 DIAGNOSIS — N946 Dysmenorrhea, unspecified: Secondary | ICD-10-CM

## 2023-03-22 MED ORDER — IBUPROFEN 800 MG PO TABS
800.0000 mg | ORAL_TABLET | Freq: Three times a day (TID) | ORAL | 0 refills | Status: AC | PRN
Start: 2023-03-22 — End: ?
  Filled 2023-03-22: qty 90, 30d supply, fill #0

## 2023-03-22 NOTE — Telephone Encounter (Signed)
Ok to send #90 to take po TID prn

## 2023-05-27 ENCOUNTER — Other Ambulatory Visit: Payer: Self-pay

## 2023-05-27 MED ORDER — AZITHROMYCIN 250 MG PO TABS
ORAL_TABLET | ORAL | 0 refills | Status: AC
  Filled 2023-05-27: qty 6, 5d supply, fill #0

## 2023-06-24 ENCOUNTER — Other Ambulatory Visit: Payer: Self-pay | Admitting: Certified Nurse Midwife

## 2023-06-24 DIAGNOSIS — Z1231 Encounter for screening mammogram for malignant neoplasm of breast: Secondary | ICD-10-CM

## 2023-07-21 ENCOUNTER — Encounter: Payer: Self-pay | Admitting: Gastroenterology

## 2023-07-29 ENCOUNTER — Other Ambulatory Visit: Payer: Self-pay

## 2023-07-29 MED ORDER — AZITHROMYCIN 250 MG PO TABS
ORAL_TABLET | ORAL | 0 refills | Status: AC
  Filled 2023-07-29: qty 6, 5d supply, fill #0

## 2023-08-30 ENCOUNTER — Other Ambulatory Visit: Payer: Self-pay

## 2023-08-30 MED ORDER — NORETHINDRONE ACETATE 5 MG PO TABS
5.0000 mg | ORAL_TABLET | Freq: Every day | ORAL | 11 refills | Status: DC
  Filled 2023-08-30: qty 30, 30d supply, fill #0

## 2023-09-01 ENCOUNTER — Other Ambulatory Visit: Payer: Self-pay | Admitting: Internal Medicine

## 2023-09-01 DIAGNOSIS — E7800 Pure hypercholesterolemia, unspecified: Secondary | ICD-10-CM

## 2023-09-09 ENCOUNTER — Other Ambulatory Visit: Payer: Self-pay

## 2023-09-09 MED ORDER — TIZANIDINE HCL 4 MG PO TABS
4.0000 mg | ORAL_TABLET | Freq: Every evening | ORAL | 0 refills | Status: AC | PRN
  Filled 2023-09-09: qty 10, 10d supply, fill #0

## 2023-09-10 ENCOUNTER — Encounter: Payer: Self-pay | Admitting: Gastroenterology

## 2023-09-10 ENCOUNTER — Other Ambulatory Visit: Payer: Self-pay

## 2023-09-17 ENCOUNTER — Other Ambulatory Visit: Payer: Self-pay

## 2023-09-17 MED ORDER — AZITHROMYCIN 250 MG PO TABS
ORAL_TABLET | ORAL | 0 refills | Status: AC
  Filled 2023-09-17: qty 6, 5d supply, fill #0

## 2023-09-23 ENCOUNTER — Encounter: Payer: Self-pay | Admitting: Gastroenterology

## 2023-09-24 ENCOUNTER — Other Ambulatory Visit: Payer: Self-pay

## 2023-09-24 MED ORDER — NA SULFATE-K SULFATE-MG SULF 17.5-3.13-1.6 GM/177ML PO SOLN
ORAL | 0 refills | Status: DC
  Filled 2023-09-24: qty 354, 1d supply, fill #0

## 2023-09-27 ENCOUNTER — Ambulatory Visit
Admission: RE | Admit: 2023-09-27 | Discharge: 2023-09-27 | Disposition: A | Discharge status: Home / Self Care | Payer: Managed Care, Other (non HMO) | Source: Ambulatory Visit | Attending: Certified Nurse Midwife | Admitting: Certified Nurse Midwife

## 2023-09-27 DIAGNOSIS — Z1231 Encounter for screening mammogram for malignant neoplasm of breast: Secondary | ICD-10-CM | POA: Insufficient documentation

## 2023-10-04 ENCOUNTER — Ambulatory Visit
Admission: RE | Admit: 2023-10-04 | Discharge: 2023-10-04 | Disposition: A | Discharge status: Home / Self Care | Payer: Managed Care, Other (non HMO) | Attending: Gastroenterology | Admitting: Gastroenterology

## 2023-10-04 ENCOUNTER — Ambulatory Visit: Payer: Managed Care, Other (non HMO) | Admitting: Certified Registered"

## 2023-10-04 ENCOUNTER — Encounter
Admission: RE | Disposition: A | Discharge status: Home / Self Care | Payer: Self-pay | Source: Home / Self Care | Attending: Gastroenterology

## 2023-10-04 ENCOUNTER — Encounter: Payer: Self-pay | Admitting: Gastroenterology

## 2023-10-04 DIAGNOSIS — K640 First degree hemorrhoids: Secondary | ICD-10-CM | POA: Insufficient documentation

## 2023-10-04 DIAGNOSIS — Z87891 Personal history of nicotine dependence: Secondary | ICD-10-CM | POA: Insufficient documentation

## 2023-10-04 DIAGNOSIS — Z1211 Encounter for screening for malignant neoplasm of colon: Secondary | ICD-10-CM | POA: Diagnosis present

## 2023-10-04 DIAGNOSIS — D128 Benign neoplasm of rectum: Secondary | ICD-10-CM | POA: Insufficient documentation

## 2023-10-04 DIAGNOSIS — D125 Benign neoplasm of sigmoid colon: Secondary | ICD-10-CM | POA: Diagnosis not present

## 2023-10-04 DIAGNOSIS — K5730 Diverticulosis of large intestine without perforation or abscess without bleeding: Secondary | ICD-10-CM | POA: Diagnosis not present

## 2023-10-04 HISTORY — PX: POLYPECTOMY: SHX5525

## 2023-10-04 HISTORY — PX: COLONOSCOPY WITH PROPOFOL: SHX5780

## 2023-10-04 HISTORY — DX: Vitamin D deficiency, unspecified: E55.9

## 2023-10-04 HISTORY — DX: Premenstrual dysphoric disorder: F32.81

## 2023-10-04 HISTORY — DX: Other intervertebral disc degeneration, lumbar region without mention of lumbar back pain or lower extremity pain: M51.369

## 2023-10-04 LAB — POCT PREGNANCY, URINE: Preg Test, Ur: NEGATIVE

## 2023-10-04 SURGERY — COLONOSCOPY WITH PROPOFOL
Anesthesia: General

## 2023-10-04 MED ORDER — PHENYLEPHRINE 80 MCG/ML (10ML) SYRINGE FOR IV PUSH (FOR BLOOD PRESSURE SUPPORT)
PREFILLED_SYRINGE | INTRAVENOUS | Status: DC | PRN
  Administered 2023-10-04: 80 ug via INTRAVENOUS

## 2023-10-04 MED ORDER — SODIUM CHLORIDE 0.9 % IV SOLN
INTRAVENOUS | Status: DC
  Administered 2023-10-04: 20 mL/h via INTRAVENOUS

## 2023-10-04 MED ORDER — LIDOCAINE HCL (PF) 2 % IJ SOLN
INTRAMUSCULAR | Status: AC
  Filled 2023-10-04: qty 5

## 2023-10-04 MED ORDER — PROPOFOL 10 MG/ML IV BOLUS
INTRAVENOUS | Status: AC
  Filled 2023-10-04: qty 40

## 2023-10-04 MED ORDER — LIDOCAINE HCL (CARDIAC) PF 100 MG/5ML IV SOSY
PREFILLED_SYRINGE | INTRAVENOUS | Status: DC | PRN
  Administered 2023-10-04: 100 mg via INTRAVENOUS

## 2023-10-04 MED ORDER — PROPOFOL 500 MG/50ML IV EMUL
INTRAVENOUS | Status: DC | PRN
  Administered 2023-10-04: 100 mg via INTRAVENOUS
  Administered 2023-10-04: 140 ug/kg/min via INTRAVENOUS

## 2023-10-04 NOTE — Anesthesia Preprocedure Evaluation (Signed)
 Anesthesia Evaluation  Patient identified by MRN, date of birth, ID band Patient awake    Reviewed: Allergy & Precautions, H&P , NPO status , Patient's Chart, lab work & pertinent test results, reviewed documented beta blocker date and time   Airway Mallampati: II   Neck ROM: full    Dental  (+) Poor Dentition   Pulmonary neg pulmonary ROS, former smoker   Pulmonary exam normal        Cardiovascular Exercise Tolerance: Good negative cardio ROS Normal cardiovascular exam Rhythm:regular Rate:Normal     Neuro/Psych  Headaches  Anxiety Depression     negative psych ROS   GI/Hepatic Neg liver ROS,GERD  Medicated,,  Endo/Other  negative endocrine ROS    Renal/GU Renal disease  negative genitourinary   Musculoskeletal   Abdominal   Peds  Hematology negative hematology ROS (+)   Anesthesia Other Findings Past Medical History: No date: Allergy No date: Anxiety No date: Arthritis No date: DDD (degenerative disc disease), lumbar No date: Depression 04/08/2016: GERD (gastroesophageal reflux disease) 2006, 2015: History of abnormal cervical Pap smear     Comment:  2015 Neg with + HR HPV, 09/2014 ASCUS with + HR HPV,               colpo with LEEP 11/2014-LGSIL (CIN-I) No date: Hyperlipidemia No date: Intestinal malrotation No date: MVP (mitral valve prolapse)     Comment:  echo in 2012 did not show MVP but did have mild               elongation of the AMVL No date: Palpitations No date: PMDD (premenstrual dysphoric disorder) 03/2015: Pyelonephritis     Comment:  evaluated by urology (Dr. Ottelin) No date: Recurrent UTI No date: Tachycardia 02/20/2022: Urgency of urination No date: Vitamin D  deficiency Past Surgical History: 1999: APPENDECTOMY 04/01/2016: CARDIAC CATHETERIZATION; N/A     Comment:  Procedure: Left Heart Cath and Coronary Angiography;                Surgeon: Lonni JONETTA Cash, MD;  Location: [facility];  Service: Cardiovascular;  Laterality:               N/A; 11/28/2014: CERVICAL BIOPSY  W/ LOOP ELECTRODE EXCISION     Comment:  LGSIL with negative margins No date: COLON SURGERY 2006, 2015: COLPOSCOPY     Comment:  2006 LGSIL, normal since; 2015 LGSIL w/LEEP 2002: EXCISION MORTON'S NEUROMA; Bilateral No date: FOOT SURGERY No date: HEMORRHOID SURGERY 1999: INTESTINAL MALROTATION REPAIR No date: NASAL SINUS SURGERY No date: TUBAL LIGATION BMI    Body Mass Index: 24.05 kg/m     Reproductive/Obstetrics negative OB ROS                             Anesthesia Physical Anesthesia Plan  ASA: 3  Anesthesia Plan: General   Post-op Pain Management:    Induction:   PONV Risk Score and Plan:   Airway Management Planned:   Additional Equipment:   Intra-op Plan:   Post-operative Plan:   Informed Consent: I have reviewed the patients History and Physical, chart, labs and discussed the procedure including the risks, benefits and alternatives for the proposed anesthesia with the patient or authorized representative who has indicated his/her understanding and acceptance.     Dental Advisory Given  Plan Discussed with: CRNA  Anesthesia  Plan Comments:        Anesthesia Quick Evaluation

## 2023-10-04 NOTE — Op Note (Signed)
 [facility] Gastroenterology Patient Name: Victoria Barr Procedure Date: 10/04/2023 9:49 AM MRN: [id number] Account #: [id number] Date of Birth: 28-Dec-1970 Admit Type: Outpatient Age: 53 Room: [facility] ENDO ROOM 2 Gender: Female Note Status: Finalized Instrument Name: Peds Colonoscope [id number] Procedure:             Colonoscopy Indications:           Screening for colorectal malignant neoplasm Providers:             [name] DO, DO Medicines:             Monitored Anesthesia Care Complications:         No immediate complications. Estimated blood loss:                         Minimal. Procedure:             Pre-Anesthesia Assessment:                        - Prior to the procedure, a History and Physical was                         performed, and patient medications and allergies were                         reviewed. The patient is competent. The risks and                         benefits of the procedure and the sedation options and                         risks were discussed with the patient. All questions                         were answered and informed consent was obtained.                         Patient identification and proposed procedure were                         verified by the physician, the nurse, the anesthetist                         and the technician in the endoscopy suite. Mental                         Status Examination: alert and oriented. Airway                         Examination: normal oropharyngeal airway and neck                         mobility. Respiratory Examination: clear to                         auscultation. CV Examination: RRR, no murmurs, no S3                         or S4. Prophylactic Antibiotics: The patient does  not                         require prophylactic antibiotics. Prior                         Anticoagulants: The patient has taken no anticoagulant                         or antiplatelet agents. ASA  Grade Assessment: III - A                         patient with severe systemic disease. After reviewing                         the risks and benefits, the patient was deemed in                         satisfactory condition to undergo the procedure. The                         anesthesia plan was to use monitored anesthesia care                         (MAC). Immediately prior to administration of                         medications, the patient was re-assessed for adequacy                         to receive sedatives. The heart rate, respiratory                         rate, oxygen saturations, blood pressure, adequacy of                         pulmonary ventilation, and response to care were                         monitored throughout the procedure. The physical                         status of the patient was re-assessed after the                         procedure.                        After obtaining informed consent, the colonoscope was                         passed under direct vision. Throughout the procedure,                         the patient's blood pressure, pulse, and oxygen                         saturations were monitored continuously. The  Colonoscope was introduced through the anus and                         advanced to the the terminal ileum, with                         identification of the appendiceal orifice and IC                         valve. The colonoscopy was performed without                         difficulty. The patient tolerated the procedure well.                         The quality of the bowel preparation was evaluated                         using the BBPS [facility] Bowel Preparation Scale) with                         scores of: Right Colon = 3, Transverse Colon = 3 and                         Left Colon = 3 (entire mucosa seen well with no                         residual staining, small fragments of stool or opaque                          liquid). The total BBPS score equals 9. The terminal                         ileum, ileocecal valve, appendiceal orifice, and                         rectum were photographed. Findings:      The perianal and digital rectal examinations were normal. Pertinent       negatives include normal sphincter tone.      The terminal ileum appeared normal. Estimated blood loss: none.      Retroflexion in the right colon was performed.      Scattered small-mouthed diverticula were found in the recto-sigmoid       colon and sigmoid colon. Estimated blood loss: none.      Three sessile polyps were found in the rectum and sigmoid colon. The       polyps were 2 to 6 mm in size. These polyps were removed with a cold       snare. Resection and retrieval were complete. Estimated blood loss was       minimal.      Non-bleeding internal hemorrhoids were found during retroflexion. The       hemorrhoids were Grade I (internal hemorrhoids that do not prolapse).       Estimated blood loss: none.      The exam was otherwise without abnormality on direct and retroflexion       views. Impression:            -  The examined portion of the ileum was normal.                        - Diverticulosis in the recto-sigmoid colon and in the                         sigmoid colon.                        - Three 2 to 6 mm polyps in the rectum and in the                         sigmoid colon, removed with a cold snare. Resected and                         retrieved.                        - Non-bleeding internal hemorrhoids.                        - The examination was otherwise normal on direct and                         retroflexion views. Recommendation:        - Patient has a contact number available for                         emergencies. The signs and symptoms of potential                         delayed complications were discussed with the patient.                         Return to normal activities  tomorrow. Written                         discharge instructions were provided to the patient.                        - Discharge patient to home.                        - Resume previous diet.                        - Continue present medications.                        - No ibuprofen , naproxen , or other non-steroidal                         anti-inflammatory drugs for 5 days after polyp removal.                        - Await pathology results.                        - Repeat colonoscopy for surveillance based on  pathology results.                        - Return to referring physician as previously                         scheduled.                        - The findings and recommendations were discussed with                         the patient. Procedure Code(s):     --- Professional ---                        [phone], Colonoscopy, flexible; with removal of                         tumor(s), polyp(s), or other lesion(s) by snare                         technique Diagnosis Code(s):     --- Professional ---                        Z12.11, Encounter for screening for malignant neoplasm                         of colon                        K64.0, First degree hemorrhoids                        D12.8, Benign neoplasm of rectum                        D12.5, Benign neoplasm of sigmoid colon                        K57.30, Diverticulosis of large intestine without                         perforation or abscess without bleeding CPT copyright 2022 American Medical Association. All rights reserved. The codes documented in this report are preliminary and upon coder review may  be revised to meet current compliance requirements. Attending Participation:      I personally performed the entire procedure. Elspeth Jungling, DO [name] DO, DO 10/04/2023 10:28:08 AM This report has been signed electronically. Number of Addenda: 0 Note Initiated On: 10/04/2023 9:49  AM Scope Withdrawal Time: 0 hours 8 minutes 22 seconds  Total Procedure Duration: 0 hours 14 minutes 19 seconds  Estimated Blood Loss:  Estimated blood loss was minimal.      [facility]

## 2023-10-04 NOTE — Transfer of Care (Signed)
 Immediate Anesthesia Transfer of Care Note  Patient: Victoria Barr  Procedure(s) Performed: COLONOSCOPY WITH PROPOFOL  POLYPECTOMY  Patient Location: Endoscopy Unit  Anesthesia Type:General  Level of Consciousness: drowsy  Airway & Oxygen Therapy: Patient Spontanous Breathing  Post-op Assessment: Report given to RN and Post -op Vital signs reviewed and stable  Post vital signs: Reviewed and stable  Last Vitals:  Vitals Value Taken Time  BP 131/87 10/04/23 1027  Temp 36.2 C 10/04/23 1026  Pulse 63 10/04/23 1027  Resp 17 10/04/23 1027  SpO2 100 % 10/04/23 1027  Vitals shown include unfiled device data.  Last Pain:  Vitals:   10/04/23 1026  TempSrc: Tympanic  PainSc: Asleep         Complications: No notable events documented.

## 2023-10-04 NOTE — H&P (Signed)
 Pre-Procedure H&P   Patient ID: Victoria Barr is a 53 y.o. female.  Gastroenterology Provider: [name], DO  Referring Provider: Romero Antigua, PA PCP: Patient, No Pcp Per  Date: 10/04/2023  HPI Victoria Barr is a 52 y.o. female who presents today for Colonoscopy for Colorectal cancer screening .  She has noted rare rectal bleeding that is painless in nature.  She is status post remote external hemorrhoid repair.  Reports daily bowel movement without other melena or hematochezia.  No family history of colon cancer or colon polyps Hemoglobin 14 MCV 89 platelets 268,000 creatinine 0.85 Previous abdominal surgery 1999 for malrotation- she is unsure of what the surgery involved.   Past Medical History:  Diagnosis Date   Allergy    Anxiety    Arthritis    DDD (degenerative disc disease), lumbar    Depression    GERD (gastroesophageal reflux disease) 04/08/2016   History of abnormal cervical Pap smear 2006, 2015   2015 Neg with + HR HPV, 09/2014 ASCUS with + HR HPV, colpo with LEEP 11/2014-LGSIL (CIN-I)   Hyperlipidemia    Intestinal malrotation    MVP (mitral valve prolapse)    echo in 2012 did not show MVP but did have mild elongation of the AMVL   Palpitations    PMDD (premenstrual dysphoric disorder)    Pyelonephritis 03/2015   evaluated by urology (Dr. Ottelin)   Recurrent UTI    Tachycardia    Urgency of urination 02/20/2022   Vitamin D  deficiency     Past Surgical History:  Procedure Laterality Date   APPENDECTOMY  1999   CARDIAC CATHETERIZATION N/A 04/01/2016   Procedure: Left Heart Cath and Coronary Angiography;  Surgeon: Lonni JONETTA Cash, MD;  Location: [facility] INVASIVE CV LAB;  Service: Cardiovascular;  Laterality: N/A;   CERVICAL BIOPSY  W/ LOOP ELECTRODE EXCISION  11/28/2014   LGSIL with negative margins   COLON SURGERY     COLPOSCOPY  2006, 2015   2006 LGSIL, normal since; 2015 LGSIL w/LEEP   EXCISION MORTON'S NEUROMA Bilateral  2002   FOOT SURGERY     HEMORRHOID SURGERY     INTESTINAL MALROTATION REPAIR  1999   NASAL SINUS SURGERY     TUBAL LIGATION      Family History No h/o GI disease or malignancy  Review of Systems  Constitutional:  Negative for activity change, appetite change, chills, diaphoresis, fatigue, fever and unexpected weight change.  HENT:  Negative for trouble swallowing and voice change.   Respiratory:  Negative for shortness of breath and wheezing.   Cardiovascular:  Negative for chest pain, palpitations and leg swelling.  Gastrointestinal:  Positive for anal bleeding. Negative for abdominal distention, abdominal pain, blood in stool, constipation, diarrhea, nausea, rectal pain and vomiting.  Musculoskeletal:  Negative for arthralgias and myalgias.  Skin:  Negative for color change and pallor.  Neurological:  Negative for dizziness, syncope and weakness.  Psychiatric/Behavioral:  Negative for confusion.   All other systems reviewed and are negative.    Medications No current facility-administered medications on file prior to encounter.   Current Outpatient Medications on File Prior to Encounter  Medication Sig Dispense Refill   azithromycin  (ZITHROMAX  Z-PAK) 250 MG tablet Take 2 talets by mouth on day 1, then 1 tablet daily after that 6 each 3   Cholecalciferol 125 MCG (5000 UT) TABS Take by mouth.     Cranberry 1000 MG CAPS Take by mouth.     cyclobenzaprine  (FLEXERIL )  5 MG tablet Take 5 mg by mouth 3 (three) times daily as needed for muscle spasms.     diclofenac  (VOLTAREN ) 75 MG EC tablet Take 1 tablet (75 mg total) by mouth 2 (two) times daily with food 60 tablet 0   fluconazole  (DIFLUCAN ) 150 MG tablet Take 1 tablet (150 mg total) by mouth daily as directed 2 tablet 11   ibuprofen  (ADVIL ) 800 MG tablet Take 1 tablet (800 mg total) by mouth every 8 (eight) hours as needed. 90 tablet 0   Multiple Vitamin (MULTIVITAMIN) capsule Take 1 capsule by mouth daily.     rosuvastatin   (CRESTOR ) 5 MG tablet Take 1 tablet (5 mg total) by mouth at bedtime. 90 tablet 3   fluticasone  (FLONASE ) 50 MCG/ACT nasal spray Place 2 sprays into both nostrils daily. 16 g 2    Pertinent medications related to GI and procedure were reviewed by me with the patient prior to the procedure   Current Facility-Administered Medications:    0.9 %  sodium chloride  infusion, , Intravenous, Continuous, Onita Elspeth Sharper, DO, Last Rate: 20 mL/hr at 10/04/23 0925, 20 mL/hr at 10/04/23 0925  sodium chloride  20 mL/hr (10/04/23 0925)       Allergies  Allergen Reactions   Imitrex [Sumatriptan] Anaphylaxis and Hives   Iohexol Hives    Patient must pre medicated if having an exam with contrast per Dr. Maxwell// rls 11/01/19 Pt tolerates LESI w/o prep. DOROTHA Loss, RN   Penicillins Hives    Has patient had a PCN reaction causing immediate rash, facial/tongue/throat swelling, SOB or lightheadedness with hypotension: Yes Has patient had a PCN reaction causing severe rash involving mucus membranes or skin necrosis: Yes Has patient had a PCN reaction that required hospitalization Yes Has patient had a PCN reaction occurring within the last 10 years: No If all of the above answers are NO, then may proceed with Cephalosporin use.    Prozac [Fluoxetine Hcl] Hives   Sulfa Antibiotics Hives   Zoloft [Sertraline Hcl] Hives   Iodinated Contrast Media Hives   Shellfish Allergy Hives   Allergies were reviewed by me prior to the procedure  Objective   Body mass index is 24.05 kg/m. Vitals:   10/04/23 0911  BP: 121/80  Pulse: 86  Resp: 20  Temp: 98.1 F (36.7 C)  TempSrc: Temporal  SpO2: 100%  Weight: 71.8 kg     Physical Exam Vitals and nursing note reviewed.  Constitutional:      General: She is not in acute distress.    Appearance: Normal appearance. She is not ill-appearing, toxic-appearing or diaphoretic.  HENT:     Head: Normocephalic and atraumatic.     Nose: Nose normal.      Mouth/Throat:     Mouth: Mucous membranes are moist.     Pharynx: Oropharynx is clear.  Eyes:     General: No scleral icterus.    Extraocular Movements: Extraocular movements intact.  Cardiovascular:     Rate and Rhythm: Normal rate and regular rhythm.     Heart sounds: Normal heart sounds. No murmur heard.    No friction rub. No gallop.  Pulmonary:     Effort: Pulmonary effort is normal. No respiratory distress.     Breath sounds: Normal breath sounds. No wheezing, rhonchi or rales.  Abdominal:     General: Abdomen is flat. Bowel sounds are normal. There is no distension.     Palpations: Abdomen is soft.     Tenderness: There is no abdominal  tenderness. There is no guarding or rebound.  Musculoskeletal:     Cervical back: Neck supple.     Right lower leg: No edema.     Left lower leg: No edema.  Skin:    General: Skin is warm and dry.     Coloration: Skin is not jaundiced or pale.  Neurological:     General: No focal deficit present.     Mental Status: She is alert and oriented to person, place, and time. Mental status is at baseline.  Psychiatric:        Mood and Affect: Mood normal.        Behavior: Behavior normal.        Thought Content: Thought content normal.        Judgment: Judgment normal.      Assessment:  Victoria Barr is a 53 y.o. female  who presents today for Colonoscopy for Colorectal cancer screening .  Plan:  Colonoscopy with possible intervention today  Colonoscopy with possible biopsy, control of bleeding, polypectomy, and interventions as necessary has been discussed with the patient/patient representative. Informed consent was obtained from the patient/patient representative after explaining the indication, nature, and risks of the procedure including but not limited to death, bleeding, perforation, missed neoplasm/lesions, cardiorespiratory compromise, and reaction to medications. Opportunity for questions was given and appropriate answers were  provided. Patient/patient representative has verbalized understanding is amenable to undergoing the procedure.   [name], DO  [facility] Gastroenterology  Portions of the record may have been created with voice recognition software. Occasional wrong-word or 'sound-a-like' substitutions may have occurred due to the inherent limitations of voice recognition software.  Read the chart carefully and recognize, using context, where substitutions may have occurred.

## 2023-10-04 NOTE — Interval H&P Note (Signed)
 History and Physical Interval Note: Preprocedure H&P from 10/04/23  was reviewed and there was no interval change after seeing and examining the patient.  Written consent was obtained from the patient after discussion of risks, benefits, and alternatives. Patient has consented to proceed with Colonoscopy with possible intervention   10/04/2023 9:57 AM  Victoria Barr  has presented today for surgery, with the diagnosis of Z12.11 (ICD-10-CM) - Colon cancer screening.  The various methods of treatment have been discussed with the patient and family. After consideration of risks, benefits and other options for treatment, the patient has consented to  Procedure(s): COLONOSCOPY WITH PROPOFOL  (N/A) as a surgical intervention.  The patient's history has been reviewed, patient examined, no change in status, stable for surgery.  I have reviewed the patient's chart and labs.  Questions were answered to the patient's satisfaction.     [name]

## 2023-10-05 LAB — SURGICAL PATHOLOGY

## 2023-10-06 NOTE — Anesthesia Postprocedure Evaluation (Signed)
 Anesthesia Post Note  Patient: Victoria Barr  Procedure(s) Performed: COLONOSCOPY WITH PROPOFOL  POLYPECTOMY  Patient location during evaluation: PACU Anesthesia Type: General Level of consciousness: awake and alert Pain management: pain level controlled Vital Signs Assessment: post-procedure vital signs reviewed and stable Respiratory status: spontaneous breathing, nonlabored ventilation, respiratory function stable and patient connected to nasal cannula oxygen Cardiovascular status: blood pressure returned to baseline and stable Postop Assessment: no apparent nausea or vomiting Anesthetic complications: no   No notable events documented.   Last Vitals:  Vitals:   10/04/23 1036 10/04/23 1046  BP: 128/64 117/71  Pulse: 82 77  Resp: 17 12  Temp:    SpO2: 100% 100%    Last Pain:  Vitals:   10/05/23 0741  TempSrc:   PainSc: 0-No pain                 Zula Hitch

## 2023-10-13 ENCOUNTER — Ambulatory Visit
Admission: RE | Admit: 2023-10-13 | Discharge: 2023-10-13 | Disposition: A | Discharge status: Home / Self Care | Payer: Self-pay | Source: Ambulatory Visit | Attending: Internal Medicine | Admitting: Internal Medicine

## 2023-10-13 DIAGNOSIS — E7800 Pure hypercholesterolemia, unspecified: Secondary | ICD-10-CM | POA: Insufficient documentation

## 2023-11-19 NOTE — Progress Notes (Deleted)
 GYNECOLOGY  VISIT   HPI: 53 y.o.   Single  {RACE/ETHNICITY:22866} female   G1P1001 with No LMP recorded.   here for: ***     GYNECOLOGIC HISTORY: No LMP recorded. Contraception:  *** Menopausal hormone therapy:  *** Last 2 paps:  07-30-20 neg hpv hr neg, 07-15-17 neg History of abnormal Pap or positive HPV:  {YES NO:22349} Mammogram:  09-27-23 category b density birads 1:neg        OB History     Gravida  1   Para  1   Term  1   Preterm  0   AB  0   Living  1      SAB  0   IAB  0   Ectopic  0   Multiple  0   Live Births  1              Patient Active Problem List   Diagnosis Date Noted   UTI (urinary tract infection) 04/15/2022   Knee pain, left 04/15/2022   Constipation 02/20/2022   Chronic lumbar pain 04/08/2021   Moderate mixed hyperlipidemia not requiring statin therapy 02/12/2021   Chronic sinusitis 02/12/2021   Migraines 02/12/2021   DDD (degenerative disc disease), lumbar 09/18/2019   PMDD (premenstrual dysphoric disorder) 07/07/2019   Vitamin D deficiency 05/23/2015   Depression 05/23/2015   Smoker 05/02/2014   LGSIL (low grade squamous intraepithelial dysplasia) 05/02/2014   VIN I (vulvar intraepithelial neoplasia I) 05/02/2014   MVP (mitral valve prolapse)    Dysuria 06/09/2013    Past Medical History:  Diagnosis Date   Allergy    Anxiety    Arthritis    DDD (degenerative disc disease), lumbar    Depression    GERD (gastroesophageal reflux disease) 04/08/2016   History of abnormal cervical Pap smear 2006, 2015   2015 Neg with + HR HPV, 09/2014 ASCUS with + HR HPV, colpo with LEEP 11/2014-LGSIL (CIN-I)   Hyperlipidemia    Intestinal malrotation    MVP (mitral valve prolapse)    echo in 2012 did not show MVP but did have mild elongation of the AMVL   Palpitations    PMDD (premenstrual dysphoric disorder)    Pyelonephritis 03/2015   evaluated by urology (Dr. Vernie Ammons)   Recurrent UTI    Tachycardia    Urgency of urination  02/20/2022   Vitamin D deficiency     Past Surgical History:  Procedure Laterality Date   APPENDECTOMY  1999   CARDIAC CATHETERIZATION N/A 04/01/2016   Procedure: Left Heart Cath and Coronary Angiography;  Surgeon: Kathleene Hazel, MD;  Location: [facility] INVASIVE CV LAB;  Service: Cardiovascular;  Laterality: N/A;   CERVICAL BIOPSY  W/ LOOP ELECTRODE EXCISION  11/28/2014   LGSIL with negative margins   COLON SURGERY     COLONOSCOPY WITH PROPOFOL N/A 10/04/2023   Procedure: COLONOSCOPY WITH PROPOFOL;  Surgeon: Jaynie Collins, DO;  Location: [facility] ENDOSCOPY;  Service: Gastroenterology;  Laterality: N/A;   COLPOSCOPY  2006, 2015   2006 LGSIL, normal since; 2015 LGSIL w/LEEP   EXCISION MORTON'S NEUROMA Bilateral 2002   FOOT SURGERY     HEMORRHOID SURGERY     INTESTINAL MALROTATION REPAIR  1999   NASAL SINUS SURGERY     POLYPECTOMY  10/04/2023   Procedure: POLYPECTOMY;  Surgeon: Jaynie Collins, DO;  Location: [facility] ENDOSCOPY;  Service: Gastroenterology;;   TUBAL LIGATION      Current Outpatient Medications  Medication Sig Dispense Refill  rosuvastatin (CRESTOR) 5 MG tablet Take 1 tablet (5 mg total) by mouth at bedtime. 90 tablet 3   azithromycin (ZITHROMAX Z-PAK) 250 MG tablet Take 2 talets by mouth on day 1, then 1 tablet daily after that 6 each 3   azithromycin (ZITHROMAX) 250 MG tablet Take 2 tablets (500mg ) by mouth on Day 1. Then take 1 tablet (250mg ) by mouth on Days 2-5. 6 tablet 0   azithromycin (ZITHROMAX) 250 MG tablet Take 2 tablets (500mg ) by mouth on Day 1, then Take 1 tablet (250mg ) by mouth on Days 2-5. 6 tablet 0   Cholecalciferol 125 MCG (5000 UT) TABS Take by mouth.     Cranberry 1000 MG CAPS Take by mouth.     cyclobenzaprine (FLEXERIL) 5 MG tablet Take 5 mg by mouth 3 (three) times daily as needed for muscle spasms.     diclofenac (VOLTAREN) 75 MG EC tablet Take 1 tablet (75 mg total) by mouth 2 (two) times daily with food 60 tablet 0   fluconazole  (DIFLUCAN) 150 MG tablet Take 1 tablet (150 mg total) by mouth daily as directed 2 tablet 11   fluticasone (FLONASE) 50 MCG/ACT nasal spray Place 2 sprays into both nostrils daily. 16 g 2   ibuprofen (ADVIL) 800 MG tablet Take 1 tablet (800 mg total) by mouth every 8 (eight) hours as needed. 90 tablet 0   Multiple Vitamin (MULTIVITAMIN) capsule Take 1 capsule by mouth daily.     tiZANidine (ZANAFLEX) 4 MG tablet Take 1 tablet (4 mg total) by mouth at bedtime as needed for Muscle spasms for up to 10 days 10 tablet 0   No current facility-administered medications for this visit.     ALLERGIES: Imitrex [sumatriptan], Iohexol, Penicillins, Prozac [fluoxetine hcl], Sulfa antibiotics, Zoloft [sertraline hcl], Iodinated contrast media, and Shellfish allergy  Family History  Problem Relation Age of Onset   Heart disease Mother    Hypertension Mother    Aneurysm Father 25       brain    Hypertension Sister    Heart disease Maternal Grandmother    Cancer Paternal Grandmother    Cancer Paternal Grandfather    Breast cancer Maternal Aunt    BRCA 1/2 Cousin        negative    Social History   Socioeconomic History   Marital status: Single    Spouse name: Not on file   Number of children: Not on file   Years of education: Not on file   Highest education level: Not on file  Occupational History   Not on file  Tobacco Use   Smoking status: Former    Current packs/day: 0.00    Types: Cigarettes    Quit date: 2022    Years since quitting: 3.1   Smokeless tobacco: Never   Tobacco comments:    2 cigarettes a day  Vaping Use   Vaping status: Never Used  Substance and Sexual Activity   Alcohol use: Yes    Comment: 2 drinks/month   Drug use: No   Sexual activity: Not Currently    Partners: Male    Birth control/protection: Surgical    Comment: BTL  Other Topics Concern   Not on file  Social History Narrative   Left handed    Social Drivers of Health   Financial Resource Strain:  Medium Risk (11/04/2023)   Received from [facility]   Overall Financial Resource Strain (CARDIA)    Difficulty of Paying Living Expenses:  Somewhat hard  Food Insecurity: Food Insecurity Present (11/04/2023)   Received from [facility]   Hunger Vital Sign    Worried About Running Out of Food in the Last Year: Sometimes true    Ran Out of Food in the Last Year: Sometimes true  Transportation Needs: No Transportation Needs (11/04/2023)   Received from [facility] - Transportation    In the past 12 months, has lack of transportation kept you from medical appointments or from getting medications?: No    Lack of Transportation (Non-Medical): No  Physical Activity: Unknown (07/22/2021)   Received from [facility]   Exercise Vital Sign    Days of Exercise per Week: Patient declined    Minutes of Exercise per Session: Not on file  Stress: Unknown (07/22/2021)   Received from [facility] - Occupational Stress Questionnaire    Feeling of Stress : Patient declined  Social Connections: Unknown (02/02/2022)   Received from [facility]   Social Network    Social Network: Not on file  Intimate Partner Violence: Unknown (12/25/2021)   Received from [facility], [facility]   HITS    Physically Hurt: Not on file    Insult or Talk Down To: Not on file    Threaten Physical Harm: Not on file    Scream or Curse: Not on file    Review of Systems  PHYSICAL EXAMINATION:   There were no vitals taken for this visit.    General appearance: alert, cooperative and appears stated age Head: Normocephalic, without obvious abnormality, atraumatic Neck: no adenopathy, supple, symmetrical, trachea midline and thyroid normal to inspection and palpation Lungs: clear to auscultation bilaterally Breasts: normal appearance, no masses or tenderness, No nipple  retraction or dimpling, No nipple discharge or bleeding, No axillary or supraclavicular adenopathy Heart: regular rate and rhythm Abdomen: soft, non-tender, no masses,  no organomegaly Extremities: extremities normal, atraumatic, no cyanosis or edema Skin: Skin color, texture, turgor normal. No rashes or lesions Lymph nodes: Cervical, supraclavicular, and axillary nodes normal. No abnormal inguinal nodes palpated Neurologic: Grossly normal  Pelvic: External genitalia:  no lesions              Urethra:  normal appearing urethra with no masses, tenderness or lesions              Bartholins and Skenes: normal                 Vagina: normal appearing vagina with normal color and discharge, no lesions              Cervix: no lesions                Bimanual Exam:  Uterus:  normal size, contour, position, consistency, mobility, non-tender              Adnexa: no mass, fullness, tenderness              Rectal exam: {yes no:314532}.  Confirms.              Anus:  normal sphincter tone, no lesions  Chaperone was present for exam:  {BSCHAPERONE:31226::"Emily F, CMA"}  ASSESSMENT:    PLAN:    {LABS (Optional):23779}  ***  total time was spent for this patient encounter, including preparation, face-to-face counseling with the patient, coordination of care, and documentation of the encounter.

## 2023-11-24 ENCOUNTER — Ambulatory Visit: Payer: 59 | Admitting: Obstetrics and Gynecology

## 2023-12-09 ENCOUNTER — Other Ambulatory Visit: Payer: Self-pay

## 2023-12-09 MED ORDER — BUPROPION HCL ER (XL) 150 MG PO TB24
150.0000 mg | ORAL_TABLET | Freq: Every day | ORAL | 0 refills | Status: DC
  Filled 2023-12-09: qty 90, 90d supply, fill #0

## 2023-12-20 ENCOUNTER — Other Ambulatory Visit: Payer: Self-pay

## 2023-12-30 ENCOUNTER — Other Ambulatory Visit: Payer: Self-pay

## 2023-12-30 MED ORDER — CIPROFLOXACIN HCL 250 MG PO TABS
250.0000 mg | ORAL_TABLET | Freq: Two times a day (BID) | ORAL | 0 refills | Status: DC
  Filled 2023-12-30: qty 28, 14d supply, fill #0

## 2024-02-11 ENCOUNTER — Other Ambulatory Visit: Payer: Self-pay

## 2024-02-11 MED ORDER — DOXYCYCLINE MONOHYDRATE 100 MG PO CAPS
100.0000 mg | ORAL_CAPSULE | Freq: Two times a day (BID) | ORAL | 0 refills | Status: AC
  Filled 2024-02-11: qty 10, 5d supply, fill #0

## 2024-02-15 ENCOUNTER — Other Ambulatory Visit: Payer: Self-pay

## 2024-02-15 MED ORDER — MEDROXYPROGESTERONE ACETATE 10 MG PO TABS
10.0000 mg | ORAL_TABLET | Freq: Every day | ORAL | 0 refills | Status: DC
  Filled 2024-02-15: qty 10, 10d supply, fill #0

## 2024-02-17 ENCOUNTER — Other Ambulatory Visit: Payer: Self-pay

## 2024-02-17 MED ORDER — NITROFURANTOIN MONOHYD MACRO 100 MG PO CAPS
100.0000 mg | ORAL_CAPSULE | Freq: Two times a day (BID) | ORAL | 0 refills | Status: AC
  Filled 2024-02-17: qty 14, 7d supply, fill #0

## 2024-02-23 ENCOUNTER — Other Ambulatory Visit: Payer: Self-pay

## 2024-02-23 MED ORDER — HYDROCORTISONE ACETATE 25 MG RE SUPP
25.0000 mg | Freq: Two times a day (BID) | RECTAL | 0 refills | Status: AC
  Filled 2024-02-23: qty 24, 12d supply, fill #0

## 2024-03-06 ENCOUNTER — Other Ambulatory Visit: Payer: Self-pay

## 2024-03-13 ENCOUNTER — Other Ambulatory Visit: Payer: Self-pay

## 2024-03-13 MED ORDER — CEFDINIR 300 MG PO CAPS
300.0000 mg | ORAL_CAPSULE | Freq: Two times a day (BID) | ORAL | 0 refills | Status: AC
  Filled 2024-03-13: qty 14, 7d supply, fill #0

## 2024-04-27 ENCOUNTER — Other Ambulatory Visit: Payer: Self-pay

## 2024-04-27 MED ORDER — MONTELUKAST SODIUM 10 MG PO TABS
10.0000 mg | ORAL_TABLET | Freq: Every day | ORAL | 0 refills | Status: DC
  Filled 2024-04-27: qty 30, 30d supply, fill #0

## 2024-04-27 MED ORDER — TRIAMCINOLONE ACETONIDE 55 MCG/ACT NA AERO
2.0000 | INHALATION_SPRAY | Freq: Every day | NASAL | 1 refills | Status: DC
  Filled 2024-04-27: qty 16.9, 30d supply, fill #0

## 2024-05-07 ENCOUNTER — Other Ambulatory Visit: Payer: Self-pay

## 2024-05-07 MED ORDER — BUPROPION HCL ER (XL) 150 MG PO TB24
ORAL_TABLET | ORAL | 0 refills | Status: DC
  Filled 2024-05-07: qty 177, 90d supply, fill #0

## 2024-05-19 ENCOUNTER — Other Ambulatory Visit: Payer: Self-pay

## 2024-05-24 ENCOUNTER — Other Ambulatory Visit: Payer: Self-pay | Admitting: Obstetrics and Gynecology

## 2024-05-24 DIAGNOSIS — Z1231 Encounter for screening mammogram for malignant neoplasm of breast: Secondary | ICD-10-CM

## 2024-07-18 ENCOUNTER — Other Ambulatory Visit: Payer: Self-pay

## 2024-07-18 MED ORDER — AZITHROMYCIN 250 MG PO TABS
ORAL_TABLET | ORAL | 0 refills | Status: DC
  Filled 2024-07-18: qty 6, 5d supply, fill #0

## 2024-09-22 ENCOUNTER — Other Ambulatory Visit: Payer: Self-pay

## 2024-09-22 MED ORDER — DOXYCYCLINE HYCLATE 100 MG PO TABS
100.0000 mg | ORAL_TABLET | Freq: Two times a day (BID) | ORAL | 0 refills | Status: DC
  Filled 2024-09-22: qty 14, 7d supply, fill #0

## 2024-09-27 ENCOUNTER — Ambulatory Visit
Admission: RE | Admit: 2024-09-27 | Discharge: 2024-09-27 | Disposition: A | Discharge status: Home / Self Care | Source: Ambulatory Visit | Attending: Obstetrics and Gynecology | Admitting: Obstetrics and Gynecology

## 2024-09-27 DIAGNOSIS — Z1231 Encounter for screening mammogram for malignant neoplasm of breast: Secondary | ICD-10-CM | POA: Diagnosis present
